# Patient Record
Sex: Female | Born: 1970 | Race: Black or African American | Hispanic: No | Smoking: Never smoker
Health system: Southern US, Community
[De-identification: ages and names within clinical notes are randomized; demographics above are authoritative.]

## PROBLEM LIST (undated history)

## (undated) DIAGNOSIS — D649 Anemia, unspecified: Secondary | ICD-10-CM

## (undated) DIAGNOSIS — M1990 Unspecified osteoarthritis, unspecified site: Secondary | ICD-10-CM

## (undated) DIAGNOSIS — K429 Umbilical hernia without obstruction or gangrene: Secondary | ICD-10-CM

## (undated) DIAGNOSIS — N920 Excessive and frequent menstruation with regular cycle: Secondary | ICD-10-CM

## (undated) DIAGNOSIS — E119 Type 2 diabetes mellitus without complications: Secondary | ICD-10-CM

## (undated) DIAGNOSIS — F419 Anxiety disorder, unspecified: Secondary | ICD-10-CM

## (undated) DIAGNOSIS — F329 Major depressive disorder, single episode, unspecified: Secondary | ICD-10-CM

## (undated) DIAGNOSIS — E785 Hyperlipidemia, unspecified: Secondary | ICD-10-CM

## (undated) DIAGNOSIS — E8881 Metabolic syndrome: Secondary | ICD-10-CM

## (undated) DIAGNOSIS — R5383 Other fatigue: Secondary | ICD-10-CM

## (undated) DIAGNOSIS — E88819 Insulin resistance, unspecified: Secondary | ICD-10-CM

## (undated) DIAGNOSIS — F32A Depression, unspecified: Secondary | ICD-10-CM

## (undated) DIAGNOSIS — M2550 Pain in unspecified joint: Secondary | ICD-10-CM

## (undated) DIAGNOSIS — R7303 Prediabetes: Secondary | ICD-10-CM

## (undated) DIAGNOSIS — E559 Vitamin D deficiency, unspecified: Secondary | ICD-10-CM

## (undated) DIAGNOSIS — I10 Essential (primary) hypertension: Secondary | ICD-10-CM

## (undated) DIAGNOSIS — R600 Localized edema: Secondary | ICD-10-CM

## (undated) DIAGNOSIS — D219 Benign neoplasm of connective and other soft tissue, unspecified: Secondary | ICD-10-CM

## (undated) HISTORY — DX: Major depressive disorder, single episode, unspecified: F32.9

## (undated) HISTORY — DX: Hyperlipidemia, unspecified: E78.5

## (undated) HISTORY — DX: Essential (primary) hypertension: I10

## (undated) HISTORY — DX: Vitamin D deficiency, unspecified: E55.9

## (undated) HISTORY — DX: Other fatigue: R53.83

## (undated) HISTORY — DX: Prediabetes: R73.03

## (undated) HISTORY — PX: REPLACEMENT TOTAL KNEE BILATERAL: SUR1225

## (undated) HISTORY — DX: Type 2 diabetes mellitus without complications: E11.9

## (undated) HISTORY — DX: Anxiety disorder, unspecified: F41.9

## (undated) HISTORY — DX: Depression, unspecified: F32.A

## (undated) HISTORY — DX: Localized edema: R60.0

## (undated) HISTORY — DX: Pain in unspecified joint: M25.50

## (undated) HISTORY — DX: Metabolic syndrome: E88.81

## (undated) HISTORY — DX: Insulin resistance, unspecified: E88.819

---
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
## 1996-01-27 HISTORY — PX: TMJ ARTHROPLASTY: SHX1066

## 1998-06-01 ENCOUNTER — Inpatient Hospital Stay (HOSPITAL_COMMUNITY): Admission: AD | Admit: 1998-06-01 | Discharge: 1998-06-01 | Payer: Self-pay | Admitting: Obstetrics and Gynecology

## 1998-11-22 ENCOUNTER — Encounter (INDEPENDENT_AMBULATORY_CARE_PROVIDER_SITE_OTHER): Payer: Self-pay | Admitting: Specialist

## 1998-11-22 ENCOUNTER — Inpatient Hospital Stay (HOSPITAL_COMMUNITY): Admission: AD | Admit: 1998-11-22 | Discharge: 1998-11-30 | Payer: Self-pay | Admitting: Obstetrics and Gynecology

## 1998-11-22 ENCOUNTER — Encounter: Payer: Self-pay | Admitting: Obstetrics and Gynecology

## 1998-11-25 ENCOUNTER — Encounter: Payer: Self-pay | Admitting: Obstetrics and Gynecology

## 1998-11-26 ENCOUNTER — Encounter: Payer: Self-pay | Admitting: Obstetrics and Gynecology

## 1998-12-01 ENCOUNTER — Encounter: Admission: RE | Admit: 1998-12-01 | Discharge: 1999-03-01 | Payer: Self-pay | Admitting: Obstetrics and Gynecology

## 2001-11-28 ENCOUNTER — Other Ambulatory Visit: Admission: RE | Admit: 2001-11-28 | Discharge: 2001-11-28 | Payer: Self-pay | Admitting: Obstetrics & Gynecology

## 2002-09-11 ENCOUNTER — Other Ambulatory Visit: Admission: RE | Admit: 2002-09-11 | Discharge: 2002-09-11 | Payer: Self-pay | Admitting: *Deleted

## 2003-01-30 ENCOUNTER — Inpatient Hospital Stay (HOSPITAL_COMMUNITY): Admission: AD | Admit: 2003-01-30 | Discharge: 2003-01-30 | Payer: Self-pay | Admitting: Obstetrics and Gynecology

## 2003-01-31 ENCOUNTER — Inpatient Hospital Stay (HOSPITAL_COMMUNITY): Admission: AD | Admit: 2003-01-31 | Discharge: 2003-02-01 | Payer: Self-pay | Admitting: Obstetrics & Gynecology

## 2003-02-06 ENCOUNTER — Inpatient Hospital Stay (HOSPITAL_COMMUNITY): Admission: AD | Admit: 2003-02-06 | Discharge: 2003-02-06 | Payer: Self-pay | Admitting: Obstetrics and Gynecology

## 2003-02-22 ENCOUNTER — Inpatient Hospital Stay (HOSPITAL_COMMUNITY): Admission: AD | Admit: 2003-02-22 | Discharge: 2003-02-27 | Payer: Self-pay | Admitting: Obstetrics and Gynecology

## 2003-02-22 ENCOUNTER — Encounter (INDEPENDENT_AMBULATORY_CARE_PROVIDER_SITE_OTHER): Payer: Self-pay | Admitting: Specialist

## 2003-02-28 ENCOUNTER — Encounter: Admission: RE | Admit: 2003-02-28 | Discharge: 2003-03-30 | Payer: Self-pay | Admitting: *Deleted

## 2003-04-10 ENCOUNTER — Other Ambulatory Visit: Admission: RE | Admit: 2003-04-10 | Discharge: 2003-04-10 | Payer: Self-pay | Admitting: *Deleted

## 2003-04-28 ENCOUNTER — Encounter: Admission: RE | Admit: 2003-04-28 | Discharge: 2003-05-28 | Payer: Self-pay | Admitting: *Deleted

## 2007-09-07 ENCOUNTER — Encounter: Admission: RE | Admit: 2007-09-07 | Discharge: 2007-09-07 | Payer: Self-pay | Admitting: Obstetrics and Gynecology

## 2008-06-20 ENCOUNTER — Encounter (INDEPENDENT_AMBULATORY_CARE_PROVIDER_SITE_OTHER): Payer: Self-pay | Admitting: Obstetrics and Gynecology

## 2008-06-20 ENCOUNTER — Ambulatory Visit (HOSPITAL_COMMUNITY): Admission: RE | Admit: 2008-06-20 | Discharge: 2008-06-20 | Payer: Self-pay | Admitting: Obstetrics and Gynecology

## 2008-06-20 HISTORY — PX: OTHER SURGICAL HISTORY: SHX169

## 2008-06-20 HISTORY — PX: NOVASURE ABLATION: SHX5394

## 2008-06-20 HISTORY — PX: HYSTEROSCOPY WITH D & C: SHX1775

## 2008-06-28 ENCOUNTER — Encounter: Admission: RE | Admit: 2008-06-28 | Discharge: 2008-06-28 | Payer: Self-pay | Admitting: Obstetrics and Gynecology

## 2008-07-13 ENCOUNTER — Encounter: Admission: RE | Admit: 2008-07-13 | Discharge: 2008-07-13 | Payer: Self-pay | Admitting: Obstetrics and Gynecology

## 2008-12-18 ENCOUNTER — Encounter: Admission: RE | Admit: 2008-12-18 | Discharge: 2008-12-18 | Payer: Self-pay | Admitting: Obstetrics and Gynecology

## 2009-07-12 ENCOUNTER — Encounter: Admission: RE | Admit: 2009-07-12 | Discharge: 2009-07-12 | Payer: Self-pay | Admitting: Obstetrics and Gynecology

## 2010-02-12 ENCOUNTER — Encounter
Admission: RE | Admit: 2010-02-12 | Discharge: 2010-02-12 | Payer: Self-pay | Source: Home / Self Care | Attending: Obstetrics and Gynecology | Admitting: Obstetrics and Gynecology

## 2010-02-16 ENCOUNTER — Encounter: Payer: Self-pay | Admitting: Obstetrics and Gynecology

## 2010-05-06 LAB — BASIC METABOLIC PANEL
BUN: 11 mg/dL (ref 6–23)
CO2: 27 mEq/L (ref 19–32)
Calcium: 9.1 mg/dL (ref 8.4–10.5)
Chloride: 102 mEq/L (ref 96–112)
Creatinine, Ser: 0.73 mg/dL (ref 0.4–1.2)
GFR calc Af Amer: >60 mL/min (ref >60)
GFR calc non Af Amer: >60 mL/min (ref >60)
Glucose, Bld: 99 mg/dL (ref 70–99)
Potassium: 3.6 mEq/L (ref 3.5–5.1)
Sodium: 135 mEq/L (ref 135–145)

## 2010-05-06 LAB — CBC
HCT: 27.9 % — ABNORMAL LOW (ref 36.0–46.0)
Hemoglobin: 8.8 g/dL — ABNORMAL LOW (ref 12.0–15.0)
MCHC: 31.5 g/dL (ref 30.0–36.0)
MCV: 71 fL — ABNORMAL LOW (ref 78.0–100.0)
Platelets: 607 10*3/uL — ABNORMAL HIGH (ref 150–400)
RBC: 3.93 MIL/uL (ref 3.87–5.11)
RDW: 23.3 % — ABNORMAL HIGH (ref 11.5–15.5)
WBC: 10.8 10*3/uL — ABNORMAL HIGH (ref 4.0–10.5)

## 2010-05-06 LAB — PREGNANCY, URINE: Preg Test, Ur: NEGATIVE

## 2010-06-10 NOTE — Op Note (Signed)
[name], [name]                  ACCOUNT NO.:  [id number]   MEDICAL RECORD NO.:  [id number]          PATIENT TYPE:  AMB   LOCATION:  SDC                           FACILITY:  [facility]   PHYSICIAN:  Maxie Better, M.D.DATE OF BIRTH:  02/15/1970   DATE OF PROCEDURE:  06/20/2008  DATE OF DISCHARGE:                               OPERATIVE REPORT   PREOPERATIVE DIAGNOSES:  1. Desires sterilization.  2. Menorrhagia.  3. Severe iron deficiency anemia.   PROCEDURE:  Diagnostic hysteroscopy, dilation and curettage, and  NovaSure endometrial ablation, laparoscopic tubal ligation with bipolar  cautery.   POSTOPERATIVE DIAGNOSES:  1. Desires sterilization.  2. Severe iron deficiency anemia.  3. Menorrhagia.  4. Liver mass.   ANESTHESIA:  General.   SURGEON:  Maxie Better, MD   ASSISTANT:  None.   PROCEDURE:  Under adequate general anesthesia, the patient was placed in  dorsal lithotomy position.  She was sterilely prepped and draped in  usual fashion.  The bladder was catheterized for small amount of urine.  Examination under anesthesia revealed a bulky, retroverted uterus.  No  adnexal masses could be appreciated.  A bivalve speculum was placed in  the vagina.  Single-tooth tenaculum was placed on the anterior lip of  the cervix.  Using the NovaSure apparatus for a uterine sound, the  uterine cavity had a depth of 7 cm and the length of the endocervical  canal was about 4.  The diagnostic hysteroscope was introduced into the  uterine cavity.  Both tubal ostia were seen.  Multiple polypoid lesions  were noted throughout.  The diagnostic hysteroscope was removed.  The  cavity was then curetted for moderate amount of tissue at that point.  The NovaSure apparatus hysteroscope was introduced, placed and checked.  Uterine cavity width was 4.5, a power of 154 was generated and the  endometrial ablation was lasted about a minute and 20 seconds.  The  apparatus was then removed.   Uterine cavity was inspected.  The  endometrial ablation occurred at that point.  The acorn cannula was  introduced into the cervical os and attached to the tenaculum for  manipulation of the uterus.  The bivalve speculum was removed and  attention was then turned to the abdomen.  Inspection of the  infraumbilical area, there was a question of a small hernia, therefore  supraumbilically 0.25% Marcaine was injected, incision made, Veress  needle was introduced, tested with good placement.  Veress needle was  then removed.  A 10 mm disposable trocar was introduced to the abdomen  without incident.  A lighted video laparoscope was introduced through  that port.  Panoramic inspection was therefore performed.  At that  point, there was noted to the right of the midline of superiorly on the  anterior abdominal wall was some adhesions.  Looking at the liver,  there was sub-diaphragmatic adhesions noted.  The right lobe of the  liver had a mass probably about 3 cm off at the edge of the right low  inferiorly.  The gallbladder was noted behind that.  This mass was  encased with some adhesions and attached to the adhesions to the  anterior abdominal wall.  Pictures were taken of that finding.  The  appendix was noted to be normal.  The pelvis had no evidence of  endometriosis.  The left tube and ovary was normal.  The right tube was  tortuous and shortened almost like salpingitis nodosum.  The right ovary  was normal.  Uterus was retroverted and bulky.  A second site was placed  suprapubically and 5-mm port was placed under direct visualization.  The  right tube was cauterized in its midportion and care was taken to make  sure that the full-thickness had been done.  The same was performed on  the contralateral side with the procedure being done.  A 0.25% Marcaine  was injected over the sites of cauterization to maximize pain  management.  The 5-mm port was then removed under direct visualization   and so was the infraumbilical port site.  After doing that the rectus  fascia was identified at the supraumbilical site and closed with 0  Vicryl figure-of-eight suture and the skin incisions all were  approximated with Dermabond.  The instruments from the vagina was  removed.  Specimen was the endometrial curetting with polyps, sent to  Pathology.   ESTIMATED BLOOD LOSS:  Minimal.   Fluid deficit from the hysteroscopy was minimal.   COMPLICATION:  None.   The patient tolerated the procedure well and was transferred to recovery  room in stable condition.  Followup on the liver mass will be done as an  outpatient basis.      Maxie Better, M.D.  Electronically Signed     [place]/MEDQ  D:  06/20/2008  T:  06/21/2008  Job:  [id number]

## 2010-06-13 NOTE — Op Note (Signed)
[name], [name]                            ACCOUNT NO.:  [id number]   MEDICAL RECORD NO.:  [id number]                   PATIENT TYPE:  INP   LOCATION:  9108                                 FACILITY:  [facility]   PHYSICIAN:  Dineen Kid. Rana Snare, M.D.                 DATE OF BIRTH:  1970-05-13   DATE OF PROCEDURE:  02/20/2003  DATE OF DISCHARGE:                                 OPERATIVE REPORT   PREOPERATIVE DIAGNOSES:  1. Intrauterine pregnancy at 33-1/2 weeks.  2. Chronic hypertension.  3. Intrauterine growth restriction.  4. Oligohydramnios.  5. Fetal distress.   POSTOPERATIVE DIAGNOSES:  1. Intrauterine pregnancy at 33-1/2 weeks.  2. Chronic hypertension.  3. Intrauterine growth restriction.  4. Oligohydramnios.  5. Fetal distress.   PROCEDURE:  Primary low semi-transverse section.   SURGEON:  Dineen Kid. Rana Snare, M.D.   ANESTHESIA:  Spinal.   ESTIMATED BLOOD LOSS:  800 mL.   INDICATIONS:  [name] is a 40 year old, G2, P1, at 33-5/7, seen in the  office today for continued evaluation of her pregnancy, and she has had  chronic hypertension and IUGR.  Blood pressure today is 170/100.  Ultrasound  shows IUGR with estimated fetal weight less than 3rd percentile.  She also  has severe oligohydramnios, and Doppler flow of the middle cerebral artery  is also abnormal, and she was admitted for labor.  She presented to labor  and delivery, upon placement on the monitor was having severe, deep variable  decelerations with decreased beat-to-beat, consistent with fetal distress.  Proceed with primary low semi-transverse cesarean section for fetal  distress.  Risks and benefits were discussed.  Informed consent was  obtained.   FINDINGS AT THE TIME OF SURGERY:  A viable female infant, Apgars of 8 and 9;  pH arterial 7.32.   DESCRIPTION OF PROCEDURE:  After adequate anesthesia, the patient placed in  the supine position with left lateral tilt.  She is sterilely prepped and  draped.  Foley  catheter was sterilely placed.  Pfannenstiel skin incision  was made 2 fingerbreadths above the pubic symphysis, taken down sharply to  the fascia, incised transversely, extended superiorly and inferiorly out the  bellies of rectus muscles which were separated sharply in the midline.  The  peritoneum is entered sharply.  Bladder flap is created and placed behind  the bladder blade.  A low segment midline incision is made down to the  infant's vertex, extended laterally with the operator's fingertips, and the  infant's vertex was delivered atraumatically, the nares and pharynx  suctioned.  The infant was then delivered, cord clamped, cut, and handed to  the pediatricians for resuscitation.  Good cry was noted.  Cord blood was  then obtained, placenta extracted manually.  Uterus was exteriorized, wiped  clean with a dry lap.  The midline incision was closed in two layers, the  first being a running  locking layer, the second being an imbricated layer of  0 Monocryl suture with good approximation, good hemostasis.  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  The uterus was placed back into the  peritoneal cavity and after a copious amount of irrigation, adequate  hemostasis was assured, peritoneum was closed with 0 Monocryl and the rectus  muscle plicated in the midline.  Irrigation was applied.  After adequate  hemostasis was assured, the fascia was closed with single layer of #0 PDS in  running fashion.  Irrigation once again applied and after adequate  hemostasis, the skin was stapled, Steri-Strips applied.  The patient  tolerated the procedure well and was stable on transfer to recovery room.  Sponge and instrument count was normal x 3.  Estimated blood loss 800 mL.  The patient received 1 g of Rocephin after delivery of placenta.  The  patient was stable on transfer.                                                Dineen Kid Rana Snare, M.D.    DCL/MEDQ  D:  02/22/2003  T:  02/22/2003  Job:  [id number]

## 2010-06-13 NOTE — H&P (Signed)
NAMEHILARY, [name]                            ACCOUNT NO.:  [id number]   MEDICAL RECORD NO.:  [id number]                   PATIENT TYPE:  INP   LOCATION:  9176                                 FACILITY:  [facility]   PHYSICIAN:  Freddy Finner, M.D.                DATE OF BIRTH:  Jun 19, 1970   DATE OF ADMISSION:  01/31/2003  DATE OF DISCHARGE:                                HISTORY & PHYSICAL   ADMITTING DIAGNOSES:  1. Intrauterine pregnancy 30-4/[redacted] weeks gestation.  2. Nonreassuring fetal heart tracing.  3. Chronic hypertension/suspected superimposed pregnancy-induced     hypertension.   BRIEF HISTORY:  Patient is a 40 year old black female gravida 2 para 1 who  had pregnancy-induced hypertension with her first pregnancy in 2000.  She is  known to have chronic hypertension and is currently being treated with  Aldomet 500 mg t.i.d.  She was seen on the day prior to this admission also  at which time a pregnancy-induced hypertension panel was obtained, she did  have a biophysical profile of 8/8 and an AFI of 11, the umbilical artery  flow study was abnormal and the middle cerebral artery flow was normal.  She  was asked to return to the office today and had a fetal heart tracing which  was nonreassuring with late looking type of decels.  She is now admitted for  further management and continuous fetal monitoring.   CURRENT REVIEW OF SYSTEMS:  She denies any cardiopulmonary, GI, or GU  symptoms.   PAST MEDICAL HISTORY:  Recorded in the prenatal summary and will not be  repeated at this time.   PHYSICAL EXAMINATION:  HEENT:  Grossly within normal limits.  Thyroid gland  is not palpably enlarged.  Blood pressure in the office was 154/96.  Deep  tendon reflexes are +3, no clonus.  CHEST:  Clear to auscultation throughout.  HEART:  Normal sinus rhythm without murmurs, rubs, or gallops.  ABDOMEN:  Gravid, fundal height is 29 cm.  CERVIX:  Dilated to 1 at the internal os, is 3 long but  softening, the  vertex is at a -2 station.   STUDIES:  Please note that on pelvic ultrasound in the office on January 30, 2003 her cervical length was measured at 3.6 but there was funneling in the  cervix, estimated fetal weight was 1211 g which is 10th percentile, the  middle cerebral artery was 4.5, the umbilical artery flow was normal.   Her PIH panel showed all parameters within normal limits at this time.   ASSESSMENT:  1. Intrauterine pregnancy 30-4/[redacted] weeks gestation.  2. Chronic hypertension not adequately managed on Aldomet 500 mg t.i.d.  3. Nonreassuring fetal heart tracing.  4. Small for gestational age fetus with abnormal flow studies.   PLAN:  Admission for continuous fetal monitoring, bed rest, fluids, and  repeat ultrasound evaluation.  Freddy Finner, M.D.    WRN/MEDQ  D:  01/31/2003  T:  01/31/2003  Job:  [id number]

## 2010-06-13 NOTE — Discharge Summary (Signed)
NAMESALLYANNE, BIRKHEAD                            ACCOUNT NO.:  [id number]   MEDICAL RECORD NO.:  [id number]                   PATIENT TYPE:  INP   LOCATION:  9108                                 FACILITY:  [facility]   PHYSICIAN:  Tracie Harrier, M.D.              DATE OF BIRTH:  March 27, 1970   DATE OF ADMISSION:  02/22/2003  DATE OF DISCHARGE:  02/27/2003                                 DISCHARGE SUMMARY   ADMITTING DIAGNOSES:  1. Intrauterine pregnancy at 33-and-a-half weeks estimated gestational age.  2. Chronic hypertension.  3. Oligohydramnios.  4. Intrauterine growth retardation.  5. Fetal distress.   DISCHARGE DIAGNOSES:  1. Status post low transverse cesarean section.  2. Viable female infant.   PROCEDURE:  Primary low transverse cesarean section.   REASON FOR ADMISSION:  Please see dictated H&P.   HOSPITAL COURSE:  The patient was a 40 year old gravida 2 para 1 that was  admitted to [facility] at 72 and five-sevenths weeks  estimated gestational age.  The patient had been seen in the office earlier  that day for continued evaluation of her pregnancy.  She had known chronic  hypertension and a history of an IUGR with her previous pregnancy.  She had  been hospitalized during this pregnancy for blood pressure management and  also nonreassuring fetal surveillance.  The patient had done well over the  previous 2 weeks; however, her blood pressure in the office was noted to be  blood pressure of 170/100.  Ultrasound also revealed intrauterine growth  restriction of less than the third percentile.  There had also been noted to  have severe oligohydramnios with abnormal Doppler flow studies in the mean  cerebral artery.  The patient was now admitted for an induction of labor.  She had received betamethasone approximately 2 weeks prior to admission for  enhancement of fetal lung maturity.  Once the patient was admitted to the  hospital and was placed on the monitor it  was noted that the baby was having  severe deep variable decelerations with decreased beat-to-beat variability  consistent with fetal distress.  Decision was made to proceed with a low  transverse cesarean section.  The patient was then transferred to the  operating room where spinal anesthesia was administered without difficulty.  A low transverse incision was made with the delivery of a viable female infant  weighing 2 pounds 12 ounces with Apgars of 8 at one minute and 9 at five  minutes.  Umbilical cord pH was 7.32.  The patient tolerated the procedure  well and was taken to the recovery room in stable condition.  On  postoperative day #1 the patient was without complaint.  She denied headache  or blurred vision or right upper quadrant pain.  Baby was stable in the  NICU.  Vital signs were stable.  Blood pressure 152 to 158 over 92 to 96.  Deep  tendon reflexes 2+ without clonus.  Abdomen was soft with good return  of bowel function.  Abdominal dressing was noted to have a small amount of  old drainage noted on bandage.  Fundus was firm and nontender.  Labs  revealed hemoglobin of 10.5, platelet count of 338, wbc count of 10.7.  On  postoperative day #2 the patient was without complaint.  She denied CNS  symptoms.  Blood pressure 151/91.  She continued to be afebrile.  Abdomen  was soft, fundus was firm.  Abdominal dressing had been removed revealing an  incision that was clean, dry, and intact.  On postoperative day #3 the  patient was without complaint.  Vital signs were stable.  She was ambulating  well and tolerating a regular diet without complaints of nausea and  vomiting.  By postoperative day #4 the patient was without complaint.  Vital  signs were stable, blood pressure 161 to 171 over 71 to 95, deep tendon  reflexes were within normal limits without clonus.  Abdomen was soft.  Fundus was firm and nontender.  The patient was ambulating well.  On  postoperative day #5 the patient  was without complaints.  Vital signs were  stable, blood pressure 153 to 171 over 94 to 110.  Abdomen was soft, fundus  was firm.  Incision was clean, dry, and intact.  Staples were removed and  the patient was discharged home.   CONDITION ON DISCHARGE:  Stable.   DIET:  Regular as tolerated..   ACTIVITY:  No heavy lifting, no driving x2 weeks, no vaginal entry..   FOLLOW-UP:  The patient is to follow up in the office in 1 week for an  incision check.  She is to call for temperature greater than 100 degrees,  persistent nausea and vomiting, heavy vaginal bleeding, and/or redness or  drainage from the incisional site.  The patient was also instructed to call  for headache, blurred vision, or right upper quadrant pain.   DISCHARGE MEDICATIONS:  1. Labetalol 200 mg one three times a day.  2. Aldomet 500 mg three times a day.  3. Percocet 5/325 #30 one p.o. q.4-6h. p.r.n. pain.  4. Motrin 600 mg q.6h. p.r.n.  5. Prenatal vitamins one p.o. daily.     Julio Sicks, N.P.                        Tracie Harrier, M.D.    CC/MEDQ  D:  04/02/2003  T:  04/02/2003  Job:  [id number]

## 2010-06-13 NOTE — H&P (Signed)
[name], [name]                            ACCOUNT NO.:  [id number]   MEDICAL RECORD NO.:  [id number]                   PATIENT TYPE:  INP   LOCATION:  9168                                 [facility]ACILITY[facility]   PHYSICIAN:  Tracie Harrier, M.D.              DATE OF BIRTH:  04-Sep-1970   DATE OF ADMISSION:  02/22/2003  DATE OF DISCHARGE:                                HISTORY & PHYSICAL   HISTORY OF PRESENT ILLNESS:  [name] is a 40 year old female (gravida 2,  para 1) at 33-5/7 weeks.  The patient was seen in the office today for  continued evaluation of her pregnancy.  She has chronic hypertension and a  history of IUGR in a previous pregnancy.  She was recently hospitalized for  blood pressure management and nonreassuring fetal surveillance.  She has  done well for the past two weeks; however, today her blood pressure has  elevated to 170/100.  Ultrasound shows intrauterine growth restriction of  less than third percentile.  There is also severe oligohydramnios.  She also  has abnormal Doppler flow in the middle cerebral artery.  She is admitted  for induction of labor.  She has received a course of steroids some two  weeks ago.   Cervical examination revealed 1-plus thick and vertex.  The patient was  notified of these findings.   OB LABS:  Maternal blood type 0 positive.  Rubella immune.  Group B strep  unknown.  Glucola was normal on February 12, 2003.   MEDICAL HISTORY:  History of chronic hypertension.   SURGICAL HISTORY:  Jaw surgery.   OBSTETRICAL HISTORY:  Normal spontaneous vaginal delivery in 2000; induction  of labor for pregnancy-induced hypertension and chronic hypertension, as  well as IUGR.   CURRENT MEDICATIONS:  1. Prenatal vitamins.  2. Aldomet.  3. Labetolol.   ALLERGIES:  NONE KNOWN.   PHYSICAL EXAMINATION:  VITAL SIGNS:  Vital signs stable.  Blood pressure  170/100, afebrile, fetal heart tones 130's with accelerations noted.  GENERAL:  She is a well  developed, well nourished female in no acute  distress.  HEENT:  Within normal limits.  NECK:  Supple without adenopathy or thyromegaly.  HEART:  Clear.  LUNGS:  Clear.  BREASTS:  Examination deferred.  ABDOMEN:  Gravid, nontender.  EXTREMITIES:  Grossly normal.  Deep tendon reflexes 2+ and no clonus.  Minimal swelling noted in the periphery.  NEUROLOGIC:  Grossly normal.  PELVIC:  Cervical examination 1 cm plus thick, vertex presentation.   ADMISSION DIAGNOSES:  1. Intrauterine pregnancy at 33-5/7 weeks.  2. Intrauterine growth restriction.  3. Oligohydramnios.  4. Abnormal Doppler flows of fetus.  5. Chronic hypertension.   PLAN:  1. Admit.  2. Induction of labor.  3. Plan of action explained to patient.  Tracie Harrier, M.D.    REG/MEDQ  D:  02/22/2003  T:  02/22/2003  Job:  [id number]

## 2010-09-30 ENCOUNTER — Other Ambulatory Visit: Payer: Self-pay | Admitting: Obstetrics and Gynecology

## 2010-09-30 DIAGNOSIS — N6009 Solitary cyst of unspecified breast: Secondary | ICD-10-CM

## 2010-09-30 IMAGING — CT CT ABDOMEN WO/W CM
2 of 8 series · 13 of 46 positions shown, 18 images · IV contrast (READICAT/WATER & [ID] OMNI 300)
Comparison: None

CLINICAL DATA: Right liver mass found during laparoscopic pelvic
surgery last week.  Right abdominal and flank pain for 6-week
months.  No history of cancer or liver disease.

CT ABDOMEN WITHOUT AND WITH CONTRAST
TECHNIQUE: Multidetector CT imaging of the abdomen was performed
following the standard protocol before and during bolus
administration of intravenous contrast.
Contrast: 125 ml Pmnipaque-KII

[Series 4: arterial & venous · axial · arterial · 0.68mm/px · z∈[-266,-26]mm · 11 of 218 slices shown, 15 images]
[im 18/218  soft-tissue]
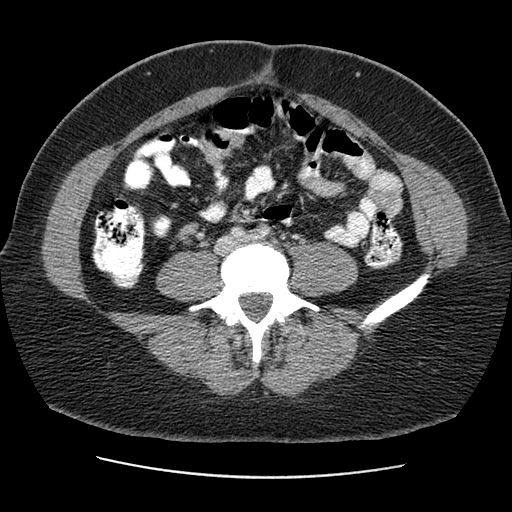
[im 18/218  bone]
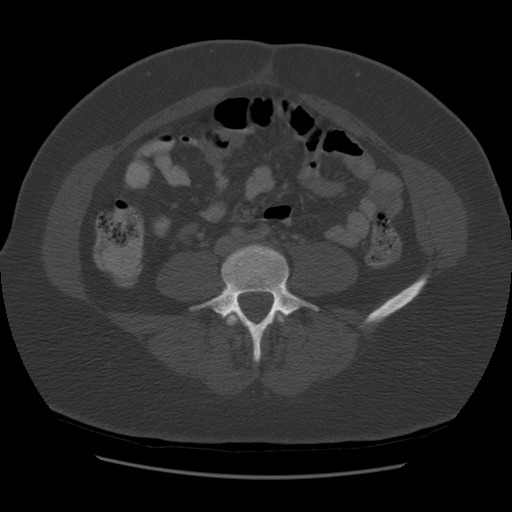
[im 44/218  soft-tissue]
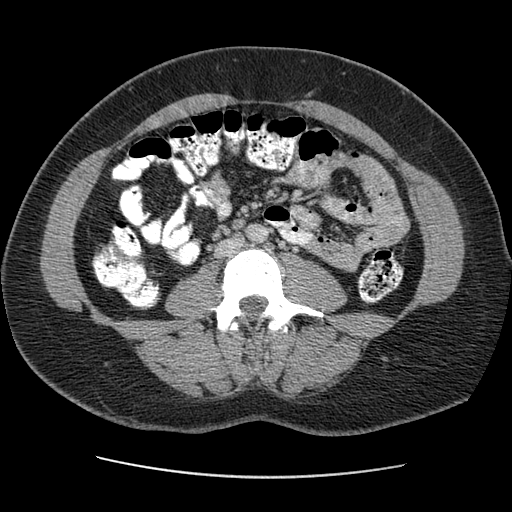
[im 61/218  soft-tissue]
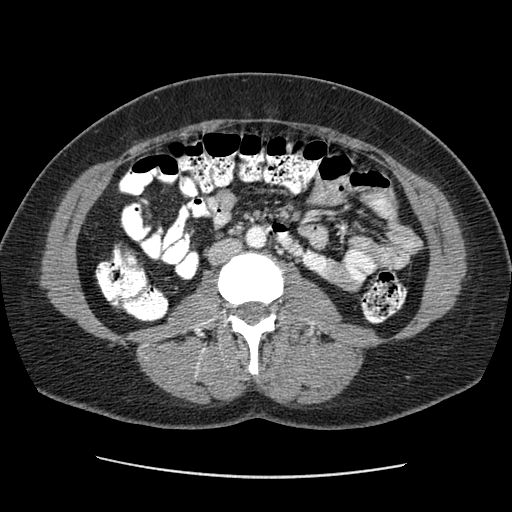
[im 87/218  soft-tissue]
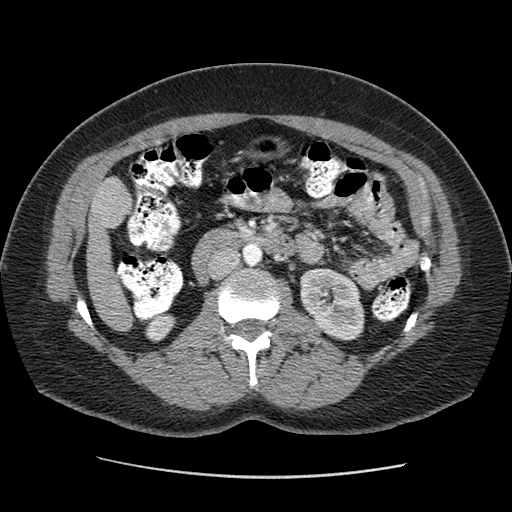
[im 113/218  soft-tissue]
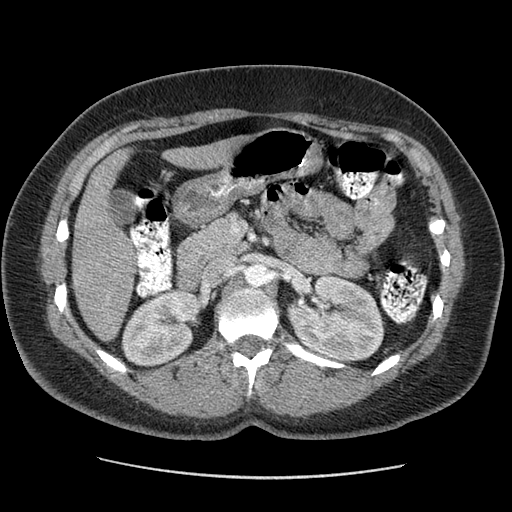
[im 131/218  soft-tissue]
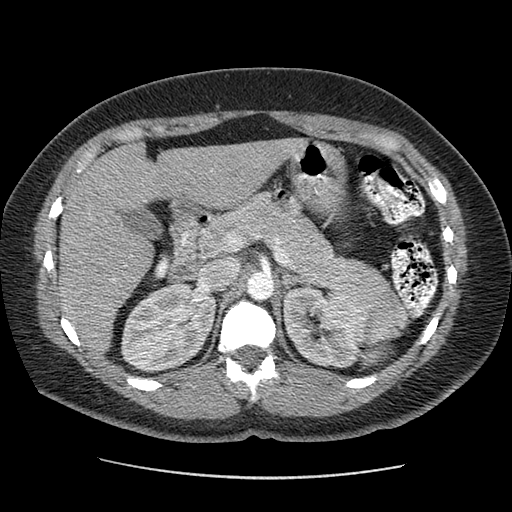
[im 157/218  soft-tissue]
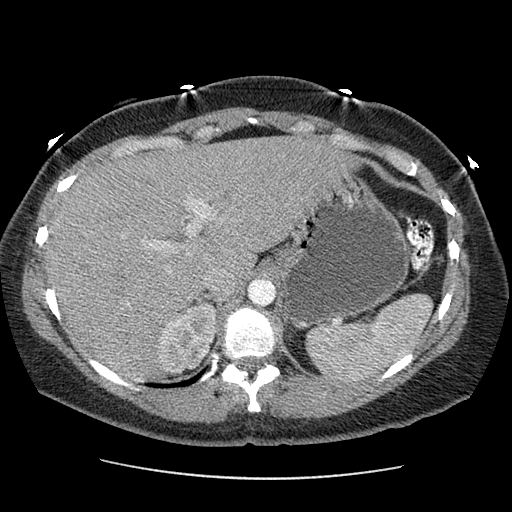
[im 183/218  soft-tissue]
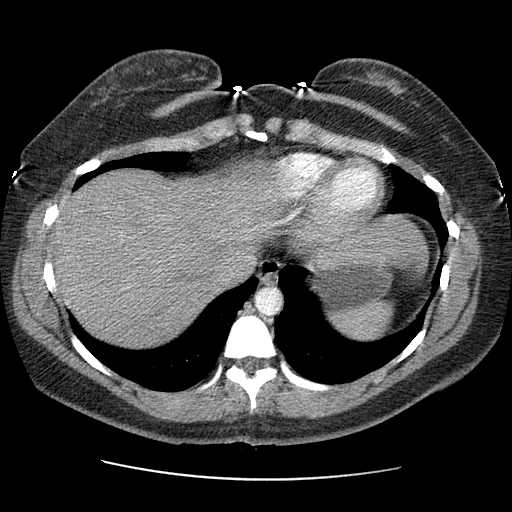
[im 183/218  lung]
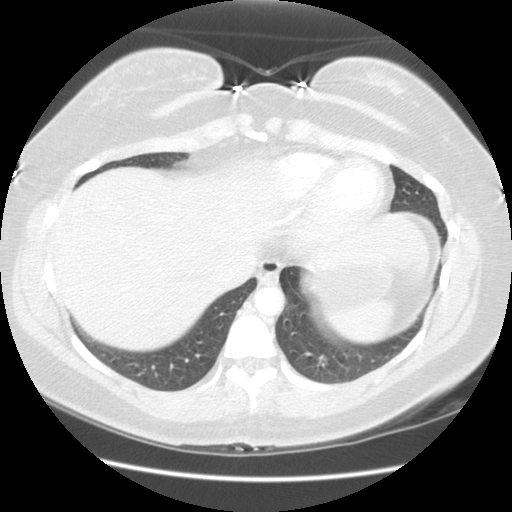
[im 191/218  lung]
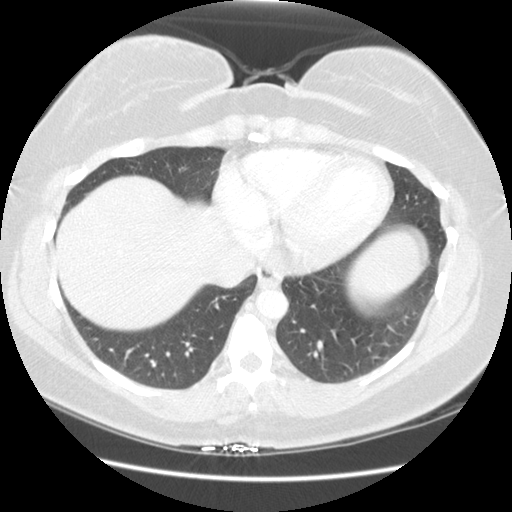
[im 200/218  soft-tissue]
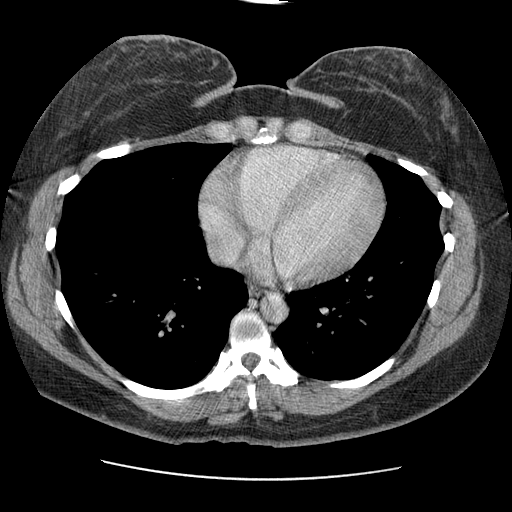
[im 200/218  lung]
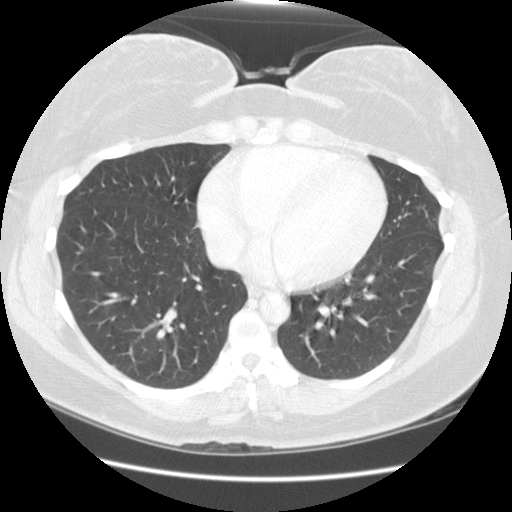
[im 200/218  bone]
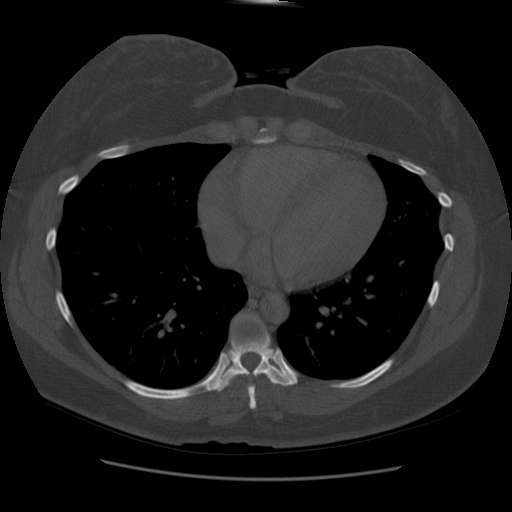
[im 209/218  lung]
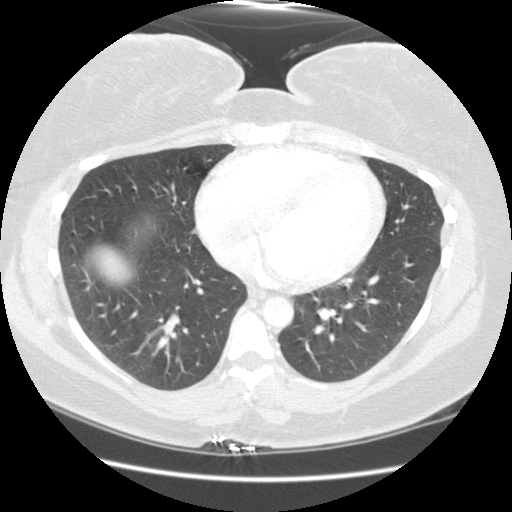

[Series 101: arterial cor · coronal · arterial · 0.68mm/px · 2 of 95 slices shown, 3 images]
[im 32/95  soft-tissue]
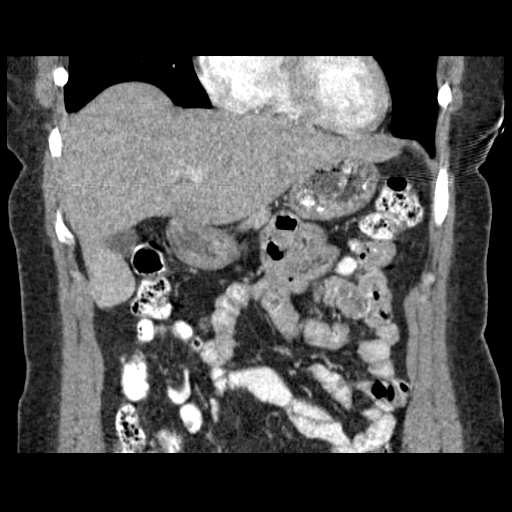
[im 32/95  bone]
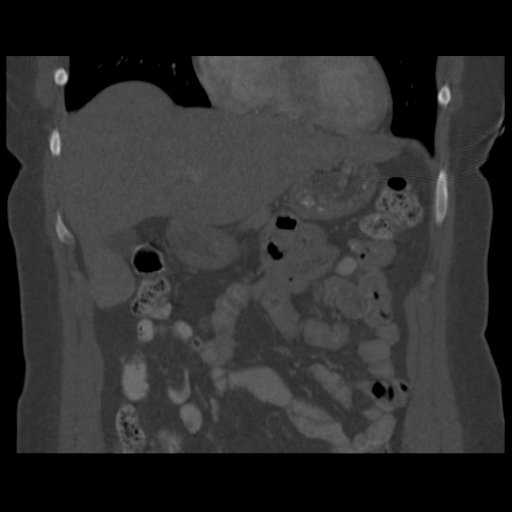
[im 63/95  soft-tissue]
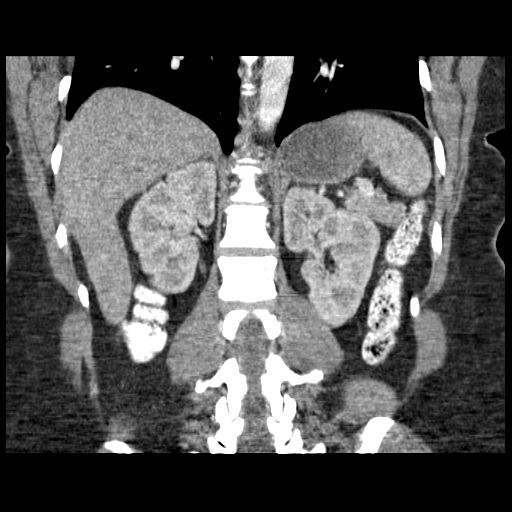

[13 of 46 positions shown; findings below may reference images not displayed]

FINDINGS: Unenhanced images demonstrate no evidence of hepatic
calcifications.  The liver mass is isodense to the remainder of the
liver prior to contrast administration.  No renal calculi.

Clear lung bases. Normal heart size without pericardial or pleural
effusion.  .

Arterial phase images demonstrate no hypervascular lesions within
the pancreas or remainder of the liver.  An exophytic inferior
right hepatic lobe mass demonstrates mild arterial
hyperenhancement.  This measures approximately 4.0 x 3.2 cm
transverse on image 62 of series [DATE] cm cranial caudal on image
34 of series 101.  This area becomes isodense to the remainder the
liver on portal venous phase images.

No evidence of cirrhosis or other liver lesions.  Normal spleen,
stomach, pancreas, gallbladder, biliary tract.  Patent portal vein
and hepatic veins.

Normal adrenal glands and kidneys. No retroperitoneal or
retrocrural adenopathy. Normal abdominal bowel loops without
ascites.  Ventral abdominal wall laxity versus minimal hernia.
Incompletely imaged.  Mildly prominent ileocolic mesenteric lymph
nodes.  The largest node measures 7 mm short axis. No acute osseous
abnormality.
IMPRESSION: 1.  4.0 cm exophytic right liver lobe mass which demonstrates mild
early / arterial hyperenhancement and  relative isodensity on more
delayed postcontrast imaging.  Favored etiology is an area of focal
nodular hyperplasia.  Differential considerations include adenoma.
Correlate with oral contraceptive use, which increases the risk of
adenoma.  This would likely be amendable to percutaneous biopsy.
Alternatively, MRI using hepatobiliary specific Eovist contrast
would likely be diagnostic.
2.  Mildly prominent ileocolic mesenteric lymph nodes which are
likely reactive. Recommend attention on follow-up.

## 2010-10-10 ENCOUNTER — Other Ambulatory Visit: Payer: Self-pay

## 2010-11-14 ENCOUNTER — Ambulatory Visit
Admission: RE | Admit: 2010-11-14 | Discharge: 2010-11-14 | Disposition: A | Discharge status: Home / Self Care | Payer: BC Managed Care – PPO | Source: Ambulatory Visit | Attending: Obstetrics and Gynecology | Admitting: Obstetrics and Gynecology

## 2010-11-14 DIAGNOSIS — N6009 Solitary cyst of unspecified breast: Secondary | ICD-10-CM

## 2010-11-27 ENCOUNTER — Encounter (INDEPENDENT_AMBULATORY_CARE_PROVIDER_SITE_OTHER): Payer: Self-pay

## 2010-12-02 ENCOUNTER — Ambulatory Visit (INDEPENDENT_AMBULATORY_CARE_PROVIDER_SITE_OTHER): Payer: BC Managed Care – PPO | Admitting: General Surgery

## 2011-04-06 LAB — BASIC METABOLIC PANEL
BUN: 8 mg/dL (ref 4–21)
Creatinine: 0.7 mg/dL (ref <=1.1)
Glucose: 74 mg/dL
Potassium: 4 mmol/L (ref 3.4–5.3)
Sodium: 141 mmol/L (ref 137–147)

## 2011-04-29 ENCOUNTER — Other Ambulatory Visit: Payer: Self-pay | Admitting: Gastroenterology

## 2011-04-29 DIAGNOSIS — K769 Liver disease, unspecified: Secondary | ICD-10-CM

## 2011-05-01 ENCOUNTER — Ambulatory Visit
Admission: RE | Admit: 2011-05-01 | Discharge: 2011-05-01 | Disposition: A | Discharge status: Home / Self Care | Payer: BC Managed Care – PPO | Source: Ambulatory Visit | Attending: Gastroenterology | Admitting: Gastroenterology

## 2011-05-01 DIAGNOSIS — K769 Liver disease, unspecified: Secondary | ICD-10-CM

## 2011-05-01 MED ORDER — GADOBENATE DIMEGLUMINE 529 MG/ML IV SOLN: 20.0000 mL | Freq: Once | INTRAVENOUS | Status: AC | PRN

## 2011-09-08 ENCOUNTER — Other Ambulatory Visit: Payer: Self-pay | Admitting: Obstetrics and Gynecology

## 2011-09-08 DIAGNOSIS — N6009 Solitary cyst of unspecified breast: Secondary | ICD-10-CM

## 2011-09-17 ENCOUNTER — Other Ambulatory Visit: Payer: BC Managed Care – PPO

## 2012-02-12 ENCOUNTER — Encounter (HOSPITAL_COMMUNITY): Payer: Self-pay

## 2012-02-18 ENCOUNTER — Other Ambulatory Visit: Payer: BC Managed Care – PPO

## 2012-02-18 ENCOUNTER — Other Ambulatory Visit: Payer: Self-pay | Admitting: Obstetrics and Gynecology

## 2012-02-18 ENCOUNTER — Inpatient Hospital Stay: Admission: RE | Admit: 2012-02-18 | Payer: BC Managed Care – PPO | Source: Ambulatory Visit

## 2012-02-18 DIAGNOSIS — N6009 Solitary cyst of unspecified breast: Secondary | ICD-10-CM

## 2012-07-13 ENCOUNTER — Ambulatory Visit: Payer: BC Managed Care – PPO | Admitting: Internal Medicine

## 2012-07-14 ENCOUNTER — Other Ambulatory Visit: Payer: BC Managed Care – PPO

## 2012-07-27 ENCOUNTER — Ambulatory Visit
Admission: RE | Admit: 2012-07-27 | Discharge: 2012-07-27 | Disposition: A | Discharge status: Home / Self Care | Payer: BC Managed Care – PPO | Source: Ambulatory Visit | Attending: Obstetrics and Gynecology | Admitting: Obstetrics and Gynecology

## 2012-07-27 DIAGNOSIS — N6009 Solitary cyst of unspecified breast: Secondary | ICD-10-CM

## 2012-07-28 ENCOUNTER — Ambulatory Visit (INDEPENDENT_AMBULATORY_CARE_PROVIDER_SITE_OTHER): Payer: BC Managed Care – PPO | Admitting: Internal Medicine

## 2012-07-28 ENCOUNTER — Encounter: Payer: Self-pay | Admitting: Internal Medicine

## 2012-07-28 VITALS — BP 138/84 | HR 82 | Temp 98.3°F | Resp 18 | Ht 66.0 in | Wt 228.0 lb

## 2012-07-28 DIAGNOSIS — Z9889 Other specified postprocedural states: Secondary | ICD-10-CM

## 2012-07-28 DIAGNOSIS — D649 Anemia, unspecified: Secondary | ICD-10-CM

## 2012-07-28 DIAGNOSIS — F411 Generalized anxiety disorder: Secondary | ICD-10-CM

## 2012-07-28 DIAGNOSIS — N920 Excessive and frequent menstruation with regular cycle: Secondary | ICD-10-CM

## 2012-07-28 DIAGNOSIS — Z803 Family history of malignant neoplasm of breast: Secondary | ICD-10-CM

## 2012-07-28 DIAGNOSIS — F419 Anxiety disorder, unspecified: Secondary | ICD-10-CM

## 2012-07-28 DIAGNOSIS — I10 Essential (primary) hypertension: Secondary | ICD-10-CM

## 2012-07-28 NOTE — Patient Instructions (Addendum)
See me as needed 

## 2012-07-28 NOTE — Progress Notes (Signed)
Subjective:    Patient ID: [name], female    DOB: September 09, 1970, 42 y.o.   MRN: [id number]  HPI [name]  is a new pt. Here for first visit (daughter of Sandi Raveling my pt).  PMH of long standing HTN, anemia secondary to menorrhagia (s/P endometrial ablation - Dr. Cherly Hensen),  DJD of knees,  Anxiety (marital stress undergoing divorce now),. FH of breast cancer in mother and maternal aunt.    [name] also report she has a ventral hernia that developed after her ablation.  She tells me she has started to have heavy menses again.  She has appt with Dr. Cherly Hensen next week  Overall doing well has FH of diabetes in mother and father and she has been craving sweets.  No polyuria, or polydipsia.    No Known Allergies Past Medical History  Diagnosis Date  . Hypertension   . Insulin resistance    Past Surgical History  Procedure Laterality Date  . Novasure ablation  06-20-2008  . Hysteroscopy w/d&c  06-20-2008  . Tubal ligation  06-20-2008  . Cryotherapy  1989  . Cesarean section    . Tmj arthroplasty  1998   History   Social History  . Marital Status: Married    Spouse Name: N/A    Number of Children: N/A  . Years of Education: N/A   Occupational History  . Not on file.   Social History Main Topics  . Smoking status: Never Smoker   . Smokeless tobacco: Never Used  . Alcohol Use: No  . Drug Use: No  . Sexually Active: Yes    Birth Control/ Protection: Surgical   Other Topics Concern  . Not on file   Social History Narrative  . No narrative on file   Family History  Problem Relation Age of Onset  . Breast cancer Mother   . Breast cancer Maternal Aunt   . Hypertension Mother   . Hypertension Father   . Diabetes Mother    Patient Active Problem List   Diagnosis Date Noted  . Anemia 07/28/2012  . Anxiety 07/28/2012  . HTN (hypertension) 07/28/2012  . Menorrhagia 07/28/2012   Current Outpatient Prescriptions on File Prior to Visit  Medication Sig Dispense Refill   . Nebivolol HCl (BYSTOLIC PO) Take 10 mg by mouth.       Marland Kitchen HYDROCODONE-IBUPROFEN PO Take by mouth.         No current facility-administered medications on file prior to visit.       Review of Systems See HPI    Objective:   Physical Exam Physical Exam  Nursing note and vitals reviewed.  Constitutional: She is oriented to person, place, and time. She appears well-developed and well-nourished.  HENT:  Head: Normocephalic and atraumatic.  Cardiovascular: Normal rate and regular rhythm. Exam reveals no gallop and no friction rub.  No murmur heard.  Pulmonary/Chest: Breath sounds normal. She has no wheezes. She has no rales.  Neurological: She is alert and oriented to person, place, and time.  Skin: Skin is warm and dry.  Psychiatric: She has a normal mood and affect. Her behavior is normal.        Assessment & Plan:  HTN:  Continue current meds.  Will check lipids, chemistries,  TSH when pt is fasting  Accucheck in office today is 105  History of anemia S/P ablation she has appt. with her GYN next week.  Anxiety  She is nearing end of divorce process and not needing meds now  DJD  Involving knees  NSAID of choice OTC for now

## 2012-08-12 ENCOUNTER — Encounter: Payer: Self-pay | Admitting: Internal Medicine

## 2012-08-12 DIAGNOSIS — Z872 Personal history of diseases of the skin and subcutaneous tissue: Secondary | ICD-10-CM | POA: Insufficient documentation

## 2012-08-12 DIAGNOSIS — E559 Vitamin D deficiency, unspecified: Secondary | ICD-10-CM | POA: Insufficient documentation

## 2012-08-12 LAB — COMPREHENSIVE METABOLIC PANEL
ALT: 11 U/L (ref 0–35)
AST: 12 U/L (ref 0–37)
Albumin: 4.4 g/dL (ref 3.5–5.2)
Alkaline Phosphatase: 54 U/L (ref 39–117)
BUN: 8 mg/dL (ref 6–23)
CO2: 28 mEq/L (ref 19–32)
Calcium: 9.5 mg/dL (ref 8.4–10.5)
Chloride: 104 mEq/L (ref 96–112)
Creat: 0.75 mg/dL (ref 0.50–1.10)
Glucose, Bld: 101 mg/dL — ABNORMAL HIGH (ref 70–99)
Potassium: 4.3 mEq/L (ref 3.5–5.3)
Sodium: 139 mEq/L (ref 135–145)
Total Bilirubin: 0.5 mg/dL (ref 0.3–1.2)
Total Protein: 7.3 g/dL (ref 6.0–8.3)

## 2012-08-12 LAB — CBC WITH DIFFERENTIAL/PLATELET
Basophils Absolute: 0.1 10*3/uL (ref 0.0–0.1)
Basophils Relative: 1 % (ref 0–1)
Eosinophils Absolute: 0.1 10*3/uL (ref 0.0–0.7)
Eosinophils Relative: 2 % (ref 0–5)
HCT: 37.1 % (ref 36.0–46.0)
Hemoglobin: 12.1 g/dL (ref 12.0–15.0)
Lymphocytes Relative: 48 % — ABNORMAL HIGH (ref 12–46)
Lymphs Abs: 3.7 10*3/uL (ref 0.7–4.0)
MCH: 27.3 pg (ref 26.0–34.0)
MCHC: 32.6 g/dL (ref 30.0–36.0)
MCV: 83.6 fL (ref 78.0–100.0)
Monocytes Absolute: 0.5 10*3/uL (ref 0.1–1.0)
Monocytes Relative: 7 % (ref 3–12)
Neutro Abs: 3.3 10*3/uL (ref 1.7–7.7)
Neutrophils Relative %: 42 % — ABNORMAL LOW (ref 43–77)
Platelets: 523 10*3/uL — ABNORMAL HIGH (ref 150–400)
RBC: 4.44 MIL/uL (ref 3.87–5.11)
RDW: 19.6 % — ABNORMAL HIGH (ref 11.5–15.5)
WBC: 7.7 10*3/uL (ref 4.0–10.5)

## 2012-08-12 LAB — LIPID PANEL
Cholesterol: 169 mg/dL (ref 0–200)
HDL: 40 mg/dL (ref >39)
LDL Cholesterol: 99 mg/dL (ref 0–99)
Total CHOL/HDL Ratio: 4.2 Ratio
Triglycerides: 152 mg/dL — ABNORMAL HIGH (ref <150)
VLDL: 30 mg/dL (ref 0–40)

## 2012-08-13 LAB — TSH: TSH: 2.047 u[IU]/mL (ref 0.350–4.500)

## 2012-08-13 LAB — VITAMIN D 25 HYDROXY (VIT D DEFICIENCY, FRACTURES): Vit D, 25-Hydroxy: 34 ng/mL (ref 30–89)

## 2012-08-16 ENCOUNTER — Encounter: Payer: Self-pay | Admitting: *Deleted

## 2012-08-23 ENCOUNTER — Ambulatory Visit (INDEPENDENT_AMBULATORY_CARE_PROVIDER_SITE_OTHER): Payer: BC Managed Care – PPO | Admitting: General Surgery

## 2012-08-23 ENCOUNTER — Encounter (INDEPENDENT_AMBULATORY_CARE_PROVIDER_SITE_OTHER): Payer: Self-pay | Admitting: General Surgery

## 2012-08-23 VITALS — BP 148/100 | HR 94 | Resp 16 | Ht 66.0 in | Wt 228.6 lb

## 2012-08-23 DIAGNOSIS — K429 Umbilical hernia without obstruction or gangrene: Secondary | ICD-10-CM

## 2012-08-23 NOTE — Patient Instructions (Signed)
Please call once you have decided whether to fix umbilical hernia separately or as part of laparoscopic gyn procedure

## 2012-09-06 ENCOUNTER — Encounter (INDEPENDENT_AMBULATORY_CARE_PROVIDER_SITE_OTHER): Payer: Self-pay | Admitting: General Surgery

## 2012-09-06 NOTE — Progress Notes (Signed)
Patient ID: [name], female   DOB: 1970/05/03, 42 y.o.   MRN: [id number]  Chief Complaint  Patient presents with  . New Evaluation    eval umb hernia    HPI [name] is a 42 y.o. female.  We are asked to see the patient in consultation by Dr. Constance Goltz to evaluate her for an umbilical hernia. The patient is a 42 year old female who has a history of C-section. She has also had her tubes tied in 2010. She has had some occasional pain associated with her bellybutton. She denies any nausea or vomiting. She has noticed some bulging at her bellybutton. Her appetite is good and her bowels are working normally.  HPI  Past Medical History  Diagnosis Date  . Hypertension   . Insulin resistance     Past Surgical History  Procedure Laterality Date  . Novasure ablation  06-20-2008  . Hysteroscopy w/d&c  06-20-2008  . Tubal ligation  06-20-2008  . Cryotherapy  1989  . Cesarean section    . Tmj arthroplasty  1998    Family History  Problem Relation Age of Onset  . Breast cancer Mother   . Hypertension Mother   . Diabetes Mother   . Cancer Mother     breast  . Breast cancer Maternal Aunt   . Cancer Maternal Aunt     beast  . Hypertension Father     Social History History  Substance Use Topics  . Smoking status: Never Smoker   . Smokeless tobacco: Never Used  . Alcohol Use: No    No Known Allergies  Current Outpatient Prescriptions  Medication Sig Dispense Refill  . cholecalciferol (VITAMIN D) 1000 UNITS tablet Take 1,000 Units by mouth daily.      Marland Kitchen HYDROCODONE-IBUPROFEN PO Take by mouth.        Marland Kitchen lisinopril-hydrochlorothiazide (PRINZIDE,ZESTORETIC) 20-12.5 MG per tablet Take 1 tablet by mouth daily.      . naproxen (NAPROSYN) 500 MG tablet Take 500 mg by mouth 2 (two) times daily with a meal.      . Nebivolol HCl (BYSTOLIC PO) Take 10 mg by mouth.       . traMADol (ULTRAM) 50 MG tablet       . tranexamic acid (LYSTEDA) 650 MG TABS        No current  facility-administered medications for this visit.    Review of Systems Review of Systems  Constitutional: Negative.   HENT: Negative.   Eyes: Negative.   Respiratory: Negative.   Cardiovascular: Negative.   Gastrointestinal: Positive for abdominal pain. Negative for nausea and vomiting.  Endocrine: Negative.   Genitourinary: Negative.   Musculoskeletal: Negative.   Skin: Negative.   Allergic/Immunologic: Negative.   Neurological: Negative.   Hematological: Negative.   Psychiatric/Behavioral: Negative.     Blood pressure 148/100, pulse 94, resp. rate 16, height 5\' 6"  (1.676 m), weight 228 lb 9.6 oz (103.692 kg), last menstrual period 07/12/2012.  Physical Exam Physical Exam  Constitutional: She is oriented to person, place, and time. She appears well-developed and well-nourished.  HENT:  Head: Normocephalic and atraumatic.  Eyes: Conjunctivae and EOM are normal. Pupils are equal, round, and reactive to light.  Neck: Normal range of motion. Neck supple.  Cardiovascular: Normal rate, regular rhythm and normal heart sounds.   Pulmonary/Chest: Effort normal and breath sounds normal.  Abdominal: Soft. Bowel sounds are normal.  There is a small moderate sized umbilical hernia that is easily reducible.  Musculoskeletal: Normal range of  motion.  Neurological: She is alert and oriented to person, place, and time.  Skin: Skin is warm and dry.  Psychiatric: She has a normal mood and affect. Her behavior is normal.    Data Reviewed As above  Assessment    The patient appears to have a small but symptomatic umbilical hernia. Because of the risk of incarceration and strangulation I think she would benefit from having this fixed. I've discussed with her in detail the risks and benefits of the operation to fix the hernia as well as some of the technical aspects including the possibility of using mesh and she understands. She is considering having another her GYN type of operation in the  near future. If she would like this fixed at the time of that operation I think that would be a reasonable plan. If we did this we would probably close the hernia primarily and not use mesh because of the risk of mesh infection. She is going to consider this and then let us know.     Plan    Plan for umbilical hernia repair possibly with mesh either as a separate procedure or as a combined procedure during another GYN operation.        TOTH III,Sephira Zellman S 09/06/2012, 8:30 AM

## 2012-10-10 ENCOUNTER — Other Ambulatory Visit: Payer: Self-pay | Admitting: *Deleted

## 2012-10-10 MED ORDER — LISINOPRIL-HYDROCHLOROTHIAZIDE 20-12.5 MG PO TABS: 1.0000 | ORAL_TABLET | Freq: Every day | ORAL | Status: DC

## 2012-10-10 MED ORDER — NAPROXEN 500 MG PO TABS: 500.0000 mg | ORAL_TABLET | Freq: Two times a day (BID) | ORAL | Status: DC

## 2012-10-10 NOTE — Telephone Encounter (Signed)
[name] needs some refills.  She especially needs the medicine for sleep asap. She states she has been out and has not slept much in 3 days.  Nebivolol HCl (BYSTOLIC PO) 10 mg   lisinopril-hydrochlorothiazide (PRINZIDE,ZESTORETIC) 20-12.5 MG per tablet  naproxen (NAPROSYN) 500 MG tablet  Quetiapine 25 mg as needed for sleep.  [facility] on [address]

## 2012-10-10 NOTE — Telephone Encounter (Signed)
[name]  Give this pt a 30 min appt to see me this week    I do not give antipsychotics (Seroquel) for sleep  Inquire if she is taking Bystolic and have pharmacy send [name] a fax for this  I sent in other refills

## 2012-10-10 NOTE — Telephone Encounter (Signed)
Pt will come in on 9/17

## 2012-10-10 NOTE — Telephone Encounter (Signed)
Refill request see Heather's note I see where the Seroquel was discontinued last visit and the bystolic has not been filled since 2012 per our records.

## 2012-10-12 ENCOUNTER — Ambulatory Visit: Payer: BC Managed Care – PPO | Admitting: Internal Medicine

## 2012-10-17 ENCOUNTER — Encounter: Payer: Self-pay | Admitting: Internal Medicine

## 2012-10-17 ENCOUNTER — Ambulatory Visit (INDEPENDENT_AMBULATORY_CARE_PROVIDER_SITE_OTHER): Payer: BC Managed Care – PPO | Admitting: Internal Medicine

## 2012-10-17 ENCOUNTER — Encounter: Payer: Self-pay | Admitting: *Deleted

## 2012-10-17 VITALS — BP 166/98 | HR 94 | Temp 99.3°F | Resp 18 | Wt 234.0 lb

## 2012-10-17 DIAGNOSIS — F411 Generalized anxiety disorder: Secondary | ICD-10-CM

## 2012-10-17 DIAGNOSIS — F418 Other specified anxiety disorders: Secondary | ICD-10-CM

## 2012-10-17 DIAGNOSIS — D649 Anemia, unspecified: Secondary | ICD-10-CM

## 2012-10-17 DIAGNOSIS — G4700 Insomnia, unspecified: Secondary | ICD-10-CM

## 2012-10-17 DIAGNOSIS — I10 Essential (primary) hypertension: Secondary | ICD-10-CM

## 2012-10-17 MED ORDER — LORAZEPAM 1 MG PO TABS: ORAL_TABLET | ORAL | Status: DC

## 2012-10-17 MED ORDER — LISINOPRIL-HYDROCHLOROTHIAZIDE 20-25 MG PO TABS: 1.0000 | ORAL_TABLET | Freq: Every day | ORAL | Status: DC

## 2012-10-17 NOTE — Patient Instructions (Addendum)
See me in 3 weeks

## 2012-10-17 NOTE — Progress Notes (Signed)
Subjective:    Patient ID: [name], female    DOB: [birth year], 42 y.o.   MRN: [id number]  HPI  [name] is here for follow up and concern over insomnia  Significant situational stresses with divorce and her 42 yo has been acting out in angry episodes.  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  NO report of snoring when she was with her spouse.    See BP . She has not had her BP meds in the last few days.  She is asymptomatic  No Known Allergies Past Medical History  Diagnosis Date  . Hypertension   . Insulin resistance    Past Surgical History  Procedure Laterality Date  . Novasure ablation  06-20-2008  . Hysteroscopy w/d&c  06-20-2008  . Tubal ligation  06-20-2008  . Cryotherapy  1989  . Cesarean section    . Tmj arthroplasty  1998   History   Social History  . Marital Status: Divorced    Spouse Name: N/A    Number of Children: N/A  . Years of Education: N/A   Occupational History  . Not on file.   Social History Main Topics  . Smoking status: Never Smoker   . Smokeless tobacco: Never Used  . Alcohol Use: No  . Drug Use: No  . Sexual Activity: Yes    Birth Control/ Protection: Surgical   Other Topics Concern  . Not on file   Social History Narrative  . No narrative on file   Family History  Problem Relation Age of Onset  . Breast cancer Mother   . Hypertension Mother   . Diabetes Mother   . Cancer Mother     breast  . Breast cancer Maternal Aunt   . Cancer Maternal Aunt     beast  . Hypertension Father    Patient Active Problem List   Diagnosis Date Noted  . Umbilical hernia 08/23/2012  . Vitamin D deficiency 08/12/2012  . History of cyst of breast 08/12/2012  . Anemia 07/28/2012  . Anxiety 07/28/2012  . HTN (hypertension) 07/28/2012  . Menorrhagia 07/28/2012  . S/P endometrial ablation 07/28/2012  . Family history of breast cancer in  first degree relative 07/28/2012   Current Outpatient Prescriptions on File Prior to Visit  Medication Sig Dispense Refill  . cholecalciferol (VITAMIN D) 1000 UNITS tablet Take 1,000 Units by mouth daily.      Marland Kitchen HYDROCODONE-IBUPROFEN PO Take by mouth.        Marland Kitchen lisinopril-hydrochlorothiazide (PRINZIDE,ZESTORETIC) 20-12.5 MG per tablet Take 1 tablet by mouth daily.  90 tablet  0  . naproxen (NAPROSYN) 500 MG tablet Take 1 tablet (500 mg total) by mouth 2 (two) times daily with a meal.  30 tablet  1  . Nebivolol HCl (BYSTOLIC PO) Take 10 mg by mouth.       . tranexamic acid (LYSTEDA) 650 MG TABS       . traMADol (ULTRAM) 50 MG tablet        No current facility-administered medications on file prior to visit.     Review of Systems See HPI     Objective:   Physical Exam Physical Exam  Nursing note and vitals reviewed.  Constitutional: She is oriented to person, place, and time. She appears well-developed and well-nourished.  HENT:  Head: Normocephalic and atraumatic.  Cardiovascular:  Normal rate and regular rhythm. Exam reveals no gallop and no friction rub.  No murmur heard.  Pulmonary/Chest: Breath sounds normal. She has no wheezes. She has no rales.  Neurological: She is alert and oriented to person, place, and time.  Skin: Skin is warm and dry.  Psychiatric: She has a normal mood and affect. Her behavior is normal.             Assessment & Plan:  HTN  Will increase lisinopril/hctz to 20/25 along with bystolic  and advised she must take her med daily.  She is to see me in 3 weeks aftert taking her BP meds every day  Insomnia  Due to stress/anxiety  Will give ativan 1 mg 1/2 to one tablet  2-3 times a week.  Advised not to take daily  Anxiety  See above  Anemia  Resolved  S/P  Endometrial ablation  See mm .  She has cysts with mildy dilated milk duct.   This if followed by Dr. Cherly Hensen. Advised pt she will need repeat in 07/2013 and to keep contact with Dr. Cherly Hensen for  this

## 2012-11-15 ENCOUNTER — Ambulatory Visit: Payer: BC Managed Care – PPO | Admitting: Internal Medicine

## 2012-11-28 ENCOUNTER — Other Ambulatory Visit: Payer: Self-pay | Admitting: *Deleted

## 2012-11-28 ENCOUNTER — Telehealth: Payer: Self-pay | Admitting: *Deleted

## 2012-11-28 MED ORDER — LORAZEPAM 1 MG PO TABS: ORAL_TABLET | ORAL | Status: DC

## 2012-11-28 NOTE — Telephone Encounter (Signed)
Bobbie  OK to call in #12 with one refill

## 2012-11-28 NOTE — Telephone Encounter (Signed)
Needs refill on LORazepam (ATIVAN) 1 MG tablet  For insomnia.  She is out of this medication.

## 2012-11-28 NOTE — Telephone Encounter (Signed)
Called in Aivan to [employer] [facility]

## 2012-11-28 NOTE — Telephone Encounter (Signed)
Refill request. Will call in pending approval 

## 2012-12-07 ENCOUNTER — Ambulatory Visit (INDEPENDENT_AMBULATORY_CARE_PROVIDER_SITE_OTHER): Payer: BC Managed Care – PPO | Admitting: Internal Medicine

## 2012-12-07 ENCOUNTER — Encounter: Payer: Self-pay | Admitting: *Deleted

## 2012-12-07 ENCOUNTER — Encounter: Payer: Self-pay | Admitting: Internal Medicine

## 2012-12-07 VITALS — BP 149/96 | HR 81 | Temp 99.0°F | Resp 16 | Wt 230.0 lb

## 2012-12-07 DIAGNOSIS — I10 Essential (primary) hypertension: Secondary | ICD-10-CM

## 2012-12-07 DIAGNOSIS — G4700 Insomnia, unspecified: Secondary | ICD-10-CM | POA: Insufficient documentation

## 2012-12-07 DIAGNOSIS — F419 Anxiety disorder, unspecified: Secondary | ICD-10-CM

## 2012-12-07 DIAGNOSIS — F411 Generalized anxiety disorder: Secondary | ICD-10-CM

## 2012-12-07 MED ORDER — ATENOLOL 25 MG PO TABS: 25.0000 mg | ORAL_TABLET | Freq: Two times a day (BID) | ORAL | Status: DC

## 2012-12-07 NOTE — Progress Notes (Signed)
Subjective:    Patient ID: [name], female    DOB: December 08, 1970, 41 y.o.   MRN: [id number]  HPI  [name] is here for follow up  She states BP at home running  140's  Systolic  She is asymptomatic .  Bystolic very expensive for her  Using Ativan at night for anxiety related insomnia.  Using only 2-3 times per week  No Known Allergies Past Medical History  Diagnosis Date  . Hypertension   . Insulin resistance    Past Surgical History  Procedure Laterality Date  . Novasure ablation  06-20-2008  . Hysteroscopy w/d&c  06-20-2008  . Tubal ligation  06-20-2008  . Cryotherapy  1989  . Cesarean section    . Tmj arthroplasty  1998   History   Social History  . Marital Status: Divorced    Spouse Name: N/A    Number of Children: N/A  . Years of Education: N/A   Occupational History  . Not on file.   Social History Main Topics  . Smoking status: Never Smoker   . Smokeless tobacco: Never Used  . Alcohol Use: No  . Drug Use: No  . Sexual Activity: Yes    Birth Control/ Protection: Surgical   Other Topics Concern  . Not on file   Social History Narrative  . No narrative on file   Family History  Problem Relation Age of Onset  . Breast cancer Mother   . Hypertension Mother   . Diabetes Mother   . Cancer Mother     breast  . Breast cancer Maternal Aunt   . Cancer Maternal Aunt     beast  . Hypertension Father    Patient Active Problem List   Diagnosis Date Noted  . Insomnia 12/07/2012  . Situational anxiety 10/17/2012  . Umbilical hernia 08/23/2012  . Vitamin D deficiency 08/12/2012  . History of cyst of breast 08/12/2012  . Anemia 07/28/2012  . Anxiety 07/28/2012  . HTN (hypertension) 07/28/2012  . Menorrhagia 07/28/2012  . S/P endometrial ablation 07/28/2012  . Family history of breast cancer in first degree relative 07/28/2012   Current Outpatient Prescriptions on File Prior to Visit  Medication Sig Dispense Refill  . cholecalciferol (VITAMIN D) 1000  UNITS tablet Take 1,000 Units by mouth daily.      Marland Kitchen HYDROCODONE-IBUPROFEN PO Take by mouth.        Marland Kitchen lisinopril-hydrochlorothiazide (PRINZIDE,ZESTORETIC) 20-25 MG per tablet Take 1 tablet by mouth daily.  90 tablet  1  . LORazepam (ATIVAN) 1 MG tablet Take one tablet 3 times a week for insomnia  12 tablet  1  . traMADol (ULTRAM) 50 MG tablet       . tranexamic acid (LYSTEDA) 650 MG TABS       . naproxen (NAPROSYN) 500 MG tablet Take 1 tablet (500 mg total) by mouth 2 (two) times daily with a meal.  30 tablet  1   No current facility-administered medications on file prior to visit.     Review of Systems See HPI    Objective:   Physical Exam  Physical Exam  Nursing note and vitals reviewed.  Constitutional: She is oriented to person, place, and time. She appears well-developed and well-nourished.  HENT:  Head: Normocephalic and atraumatic.  Cardiovascular: Normal rate and regular rhythm. Exam reveals no gallop and no friction rub.  No murmur heard.  Pulmonary/Chest: Breath sounds normal. She has no wheezes. She has no rales.  Neurological: She is alert and  oriented to person, place, and time.  Skin: Skin is warm and dry.  Psychiatric: She has a normal mood and affect. Her behavior is normal.             Assessment & Plan:  HTN will change to atenolol  25 mg bid  See me in 4 weeks  Anxiety     Insomnia  Ok to Use Ativan 1/2 to one tablet 3 times hs prn

## 2013-01-04 ENCOUNTER — Encounter: Payer: Self-pay | Admitting: *Deleted

## 2013-01-04 ENCOUNTER — Ambulatory Visit (INDEPENDENT_AMBULATORY_CARE_PROVIDER_SITE_OTHER): Payer: BC Managed Care – PPO | Admitting: Internal Medicine

## 2013-01-04 ENCOUNTER — Encounter: Payer: Self-pay | Admitting: Internal Medicine

## 2013-01-04 VITALS — BP 160/90 | HR 90 | Temp 98.2°F | Resp 18 | Wt 233.0 lb

## 2013-01-04 DIAGNOSIS — F418 Other specified anxiety disorders: Secondary | ICD-10-CM

## 2013-01-04 DIAGNOSIS — I10 Essential (primary) hypertension: Secondary | ICD-10-CM

## 2013-01-04 DIAGNOSIS — F411 Generalized anxiety disorder: Secondary | ICD-10-CM

## 2013-01-04 MED ORDER — SCOPOLAMINE 1 MG/3DAYS TD PT72: 1.0000 | MEDICATED_PATCH | TRANSDERMAL | Status: DC

## 2013-01-04 MED ORDER — LISINOPRIL 40 MG PO TABS: 40.0000 mg | ORAL_TABLET | Freq: Every day | ORAL | Status: DC

## 2013-01-04 MED ORDER — FUROSEMIDE 20 MG PO TABS: 20.0000 mg | ORAL_TABLET | Freq: Every day | ORAL | Status: DC

## 2013-01-04 NOTE — Patient Instructions (Signed)
Buy Dramamine  Over the counter   Take lisinopril  40 mg every morning  Take Lasix (furosemide)  20 mg every morning  Water pill  Take atenolol  25 mg in moringing  With other pills and take again in evening  See me  On December 22nd

## 2013-01-04 NOTE — Progress Notes (Signed)
Subjective:    Patient ID: [name], female    DOB: [birth year], 42 y.o.   MRN: [id number]  HPI  Arlenis is here for follow up on her BP.  She is asymptomatic    Lots of stress at work and home.  She is undergoing a divorce and this stresses her  No Known Allergies Past Medical History  Diagnosis Date  . Hypertension   . Insulin resistance    Past Surgical History  Procedure Laterality Date  . Novasure ablation  06-20-2008  . Hysteroscopy w/d&c  06-20-2008  . Tubal ligation  06-20-2008  . Cryotherapy  1989  . Cesarean section    . Tmj arthroplasty  1998   History   Social History  . Marital Status: Divorced    Spouse Name: N/A    Number of Children: N/A  . Years of Education: N/A   Occupational History  . Not on file.   Social History Main Topics  . Smoking status: Never Smoker   . Smokeless tobacco: Never Used  . Alcohol Use: No  . Drug Use: No  . Sexual Activity: Yes    Birth Control/ Protection: Surgical   Other Topics Concern  . Not on file   Social History Narrative  . No narrative on file   Family History  Problem Relation Age of Onset  . Breast cancer Mother   . Hypertension Mother   . Diabetes Mother   . Cancer Mother     breast  . Breast cancer Maternal Aunt   . Cancer Maternal Aunt     beast  . Hypertension Father    Patient Active Problem List   Diagnosis Date Noted  . Insomnia 12/07/2012  . Situational anxiety 10/17/2012  . Umbilical hernia 08/23/2012  . Vitamin D deficiency 08/12/2012  . History of cyst of breast 08/12/2012  . Anemia 07/28/2012  . Anxiety 07/28/2012  . HTN (hypertension) 07/28/2012  . Menorrhagia 07/28/2012  . S/P endometrial ablation 07/28/2012  . Family history of breast cancer in first degree relative 07/28/2012   Current Outpatient Prescriptions on File Prior to Visit  Medication Sig Dispense Refill  . atenolol (TENORMIN) 25 MG tablet Take 1 tablet (25 mg total) by mouth 2 (two) times daily.  60 tablet   3  . cholecalciferol (VITAMIN D) 1000 UNITS tablet Take 1,000 Units by mouth daily.      Marland Kitchen HYDROCODONE-IBUPROFEN PO Take by mouth.        Marland Kitchen LORazepam (ATIVAN) 1 MG tablet Take one tablet 3 times a week for insomnia  12 tablet  1  . naproxen (NAPROSYN) 500 MG tablet Take 1 tablet (500 mg total) by mouth 2 (two) times daily with a meal.  30 tablet  1  . traMADol (ULTRAM) 50 MG tablet       . tranexamic acid (LYSTEDA) 650 MG TABS        No current facility-administered medications on file prior to visit.     Review of Systems     Objective:   Physical Exam Physical Exam  Nursing note and vitals reviewed.   Repeat BP  160/90 Constitutional: She is oriented to person, place, and time. She appears well-developed and well-nourished.  HENT:  Head: Normocephalic and atraumatic.  Cardiovascular: Normal rate and regular rhythm. Exam reveals no gallop and no friction rub.  No murmur heard.  Pulmonary/Chest: Breath sounds normal. She has no wheezes. She has no rales.  Neurological: She is alert and oriented to  person, place, and time.  Skin: Skin is warm and dry.  Psychiatric: She has a normal mood and affect. Her behavior is normal.              Assessment & Plan:  HTN  Uncontrolled  Will increase Lisinopril to 40 mg,  Stop HCTZ and change to lasix 20 mg daily,  Continue  Atenolol 25 bid.  See me Dec 22nd or sooner prn  Situational stress

## 2013-01-16 ENCOUNTER — Ambulatory Visit (INDEPENDENT_AMBULATORY_CARE_PROVIDER_SITE_OTHER): Payer: BC Managed Care – PPO | Admitting: Internal Medicine

## 2013-01-16 ENCOUNTER — Encounter: Payer: Self-pay | Admitting: Internal Medicine

## 2013-01-16 VITALS — BP 138/88 | HR 82 | Temp 97.4°F | Resp 18 | Wt 229.0 lb

## 2013-01-16 DIAGNOSIS — T50905A Adverse effect of unspecified drugs, medicaments and biological substances, initial encounter: Secondary | ICD-10-CM

## 2013-01-16 DIAGNOSIS — Z7189 Other specified counseling: Secondary | ICD-10-CM

## 2013-01-16 DIAGNOSIS — I10 Essential (primary) hypertension: Secondary | ICD-10-CM

## 2013-01-16 DIAGNOSIS — Z630 Problems in relationship with spouse or partner: Secondary | ICD-10-CM

## 2013-01-16 LAB — BASIC METABOLIC PANEL
BUN: 12 mg/dL (ref 6–23)
CO2: 26 mEq/L (ref 19–32)
Calcium: 9.1 mg/dL (ref 8.4–10.5)
Chloride: 103 mEq/L (ref 96–112)
Creat: 0.71 mg/dL (ref 0.50–1.10)
Glucose, Bld: 90 mg/dL (ref 70–99)
Potassium: 4 mEq/L (ref 3.5–5.3)
Sodium: 139 mEq/L (ref 135–145)

## 2013-01-16 MED ORDER — POTASSIUM CHLORIDE ER 20 MEQ PO TBCR: 10.0000 meq | EXTENDED_RELEASE_TABLET | Freq: Every day | ORAL | Status: DC

## 2013-01-16 NOTE — Patient Instructions (Signed)
See me in 2 months 

## 2013-01-16 NOTE — Progress Notes (Signed)
Subjective:    Patient ID: [name], female    DOB: 12-08-1970, 42 y.o.   MRN: [id number]  HPI  [name]  Is here for follow up for htn  I have increased her Lisinopril to 40 mg and added lasix 20 mg daily.   HCTZ has been stopped  Tolerating well   BP at home  130's over 80-90.    She is undergoing divorce and very stressed with ex husband over the holidays  No Known Allergies Past Medical History  Diagnosis Date  . Hypertension   . Insulin resistance    Past Surgical History  Procedure Laterality Date  . Novasure ablation  06-20-2008  . Hysteroscopy w/d&c  06-20-2008  . Tubal ligation  06-20-2008  . Cryotherapy  1989  . Cesarean section    . Tmj arthroplasty  1998   History   Social History  . Marital Status: Divorced    Spouse Name: N/A    Number of Children: N/A  . Years of Education: N/A   Occupational History  . Not on file.   Social History Main Topics  . Smoking status: Never Smoker   . Smokeless tobacco: Never Used  . Alcohol Use: No  . Drug Use: No  . Sexual Activity: Yes    Birth Control/ Protection: Surgical   Other Topics Concern  . Not on file   Social History Narrative  . No narrative on file   Family History  Problem Relation Age of Onset  . Breast cancer Mother   . Hypertension Mother   . Diabetes Mother   . Cancer Mother     breast  . Breast cancer Maternal Aunt   . Cancer Maternal Aunt     beast  . Hypertension Father    Patient Active Problem List   Diagnosis Date Noted  . Insomnia 12/07/2012  . Situational anxiety 10/17/2012  . Umbilical hernia 08/23/2012  . Vitamin D deficiency 08/12/2012  . History of cyst of breast 08/12/2012  . Anemia 07/28/2012  . Anxiety 07/28/2012  . HTN (hypertension) 07/28/2012  . Menorrhagia 07/28/2012  . S/P endometrial ablation 07/28/2012  . Family history of breast cancer in first degree relative 07/28/2012   Current Outpatient Prescriptions on File Prior to Visit  Medication Sig  Dispense Refill  . atenolol (TENORMIN) 25 MG tablet Take 1 tablet (25 mg total) by mouth 2 (two) times daily.  60 tablet  3  . cholecalciferol (VITAMIN D) 1000 UNITS tablet Take 1,000 Units by mouth daily.      . furosemide (LASIX) 20 MG tablet Take 1 tablet (20 mg total) by mouth daily.  30 tablet  3  . lisinopril (PRINIVIL,ZESTRIL) 40 MG tablet Take 1 tablet (40 mg total) by mouth daily.  90 tablet  3  . LORazepam (ATIVAN) 1 MG tablet Take one tablet 3 times a week for insomnia  12 tablet  1  . naproxen (NAPROSYN) 500 MG tablet Take 1 tablet (500 mg total) by mouth 2 (two) times daily with a meal.  30 tablet  1  . tranexamic acid (LYSTEDA) 650 MG TABS       . HYDROCODONE-IBUPROFEN PO Take by mouth.        Marland Kitchen scopolamine (TRANSDERM-SCOP) 1.5 MG Place 1 patch (1.5 mg total) onto the skin every 3 (three) days.  1 patch  0  . traMADol (ULTRAM) 50 MG tablet        No current facility-administered medications on file prior to visit.  Review of Systems See HPI    Objective:   Physical Exam Physical Exam  Nursing note and vitals reviewed.   Repeat BP  138/88 Constitutional: She is oriented to person, place, and time. She appears well-developed and well-nourished.  HENT:  Head: Normocephalic and atraumatic.  Cardiovascular: Normal rate and regular rhythm. Exam reveals no gallop and no friction rub.  No murmur heard.  Pulmonary/Chest: Breath sounds normal. She has no wheezes. She has no rales.  Neurological: She is alert and oriented to person, place, and time.  Skin: Skin is warm and dry.  Psychiatric: She has a normal mood and affect. Her behavior is normal.        Assessment & Plan:  HTN:   Continue meds as above  Check K today.  Will add K-dur 20 meq daily  See me in two months  Med sided effect  Check K today  Marital discord

## 2013-01-23 ENCOUNTER — Encounter: Payer: Self-pay | Admitting: *Deleted

## 2013-02-05 ENCOUNTER — Other Ambulatory Visit: Payer: Self-pay | Admitting: Internal Medicine

## 2013-02-06 NOTE — Telephone Encounter (Signed)
Refill request for Ativan

## 2013-02-07 ENCOUNTER — Other Ambulatory Visit: Payer: Self-pay | Admitting: Internal Medicine

## 2013-02-07 MED ORDER — LORAZEPAM 1 MG PO TABS: ORAL_TABLET | ORAL | Status: DC

## 2013-03-22 ENCOUNTER — Ambulatory Visit: Payer: BC Managed Care – PPO | Admitting: Internal Medicine

## 2013-04-04 ENCOUNTER — Telehealth: Payer: Self-pay | Admitting: *Deleted

## 2013-04-04 NOTE — Telephone Encounter (Signed)
Found pt RX at desk called LVM message for pt to return call

## 2013-04-13 ENCOUNTER — Other Ambulatory Visit: Payer: Self-pay | Admitting: *Deleted

## 2013-04-13 NOTE — Telephone Encounter (Signed)
Called in ativan to [facility]. Pt never picked written RX up from 02/07/13.

## 2013-05-01 ENCOUNTER — Encounter: Payer: Self-pay | Admitting: Internal Medicine

## 2013-05-01 ENCOUNTER — Ambulatory Visit (INDEPENDENT_AMBULATORY_CARE_PROVIDER_SITE_OTHER): Payer: BC Managed Care – PPO | Admitting: Internal Medicine

## 2013-05-01 VITALS — BP 130/82 | HR 82 | Temp 98.4°F | Resp 18 | Wt 235.0 lb

## 2013-05-01 DIAGNOSIS — N949 Unspecified condition associated with female genital organs and menstrual cycle: Secondary | ICD-10-CM

## 2013-05-01 DIAGNOSIS — F419 Anxiety disorder, unspecified: Secondary | ICD-10-CM

## 2013-05-01 DIAGNOSIS — R102 Pelvic and perineal pain: Secondary | ICD-10-CM | POA: Insufficient documentation

## 2013-05-01 DIAGNOSIS — I10 Essential (primary) hypertension: Secondary | ICD-10-CM

## 2013-05-01 DIAGNOSIS — F411 Generalized anxiety disorder: Secondary | ICD-10-CM

## 2013-05-01 LAB — BASIC METABOLIC PANEL
BUN: 10 mg/dL (ref 6–23)
CO2: 26 mEq/L (ref 19–32)
Calcium: 8.9 mg/dL (ref 8.4–10.5)
Chloride: 104 mEq/L (ref 96–112)
Creat: 0.81 mg/dL (ref 0.50–1.10)
Glucose, Bld: 87 mg/dL (ref 70–99)
Potassium: 4.3 mEq/L (ref 3.5–5.3)
Sodium: 139 mEq/L (ref 135–145)

## 2013-05-01 NOTE — Patient Instructions (Signed)
See me as needed 

## 2013-05-01 NOTE — Progress Notes (Signed)
Subjective:    Patient ID: [name], female    DOB: [birth year], 43 y.o.   MRN: [id number]  HPI  [name] is here for follow up of her HTN  I added Lasix with K supplement to her lisinopril.  Toelrating fine  No headache or visual change  She has painful menses which is managed by Dr. Garwin Chandler.  She take Lysteda and hydrocodone for this.  Pt reports her GYN has recommended a hysterectomy but she is not ready for this.    No Known Allergies Past Medical History  Diagnosis Date  . Hypertension   . Insulin resistance    Past Surgical History  Procedure Laterality Date  . Novasure ablation  06-20-2008  . Hysteroscopy w/d&c  06-20-2008  . Tubal ligation  06-20-2008  . Cryotherapy  1989  . Cesarean section    . Tmj arthroplasty  1998   History   Social History  . Marital Status: Divorced    Spouse Name: N/A    Number of Children: N/A  . Years of Education: N/A   Occupational History  . Not on file.   Social History Main Topics  . Smoking status: Never Smoker   . Smokeless tobacco: Never Used  . Alcohol Use: No  . Drug Use: No  . Sexual Activity: Yes    Birth Control/ Protection: Surgical   Other Topics Concern  . Not on file   Social History Narrative  . No narrative on file   Family History  Problem Relation Age of Onset  . Breast cancer Mother   . Hypertension Mother   . Diabetes Mother   . Cancer Mother     breast  . Breast cancer Maternal Aunt   . Cancer Maternal Aunt     beast  . Hypertension Father    Patient Active Problem List   Diagnosis Date Noted  . Insomnia 12/07/2012  . Situational anxiety 10/17/2012  . Umbilical hernia 58/09/9831  . Vitamin D deficiency 08/12/2012  . History of cyst of breast 08/12/2012  . Anemia 07/28/2012  . Anxiety 07/28/2012  . HTN (hypertension) 07/28/2012  . Menorrhagia 07/28/2012  . S/P endometrial ablation 07/28/2012  . Family history of breast cancer in first degree relative 07/28/2012   Current Outpatient  Prescriptions on File Prior to Visit  Medication Sig Dispense Refill  . atenolol (TENORMIN) 25 MG tablet Take 1 tablet (25 mg total) by mouth 2 (two) times daily.  60 tablet  3  . cholecalciferol (VITAMIN D) 1000 UNITS tablet Take 1,000 Units by mouth daily.      . furosemide (LASIX) 20 MG tablet Take 1 tablet (20 mg total) by mouth daily.  30 tablet  3  . lisinopril (PRINIVIL,ZESTRIL) 40 MG tablet Take 1 tablet (40 mg total) by mouth daily.  90 tablet  3  . LORazepam (ATIVAN) 1 MG tablet Take one tablet at HS three times a week for insomnia  12 tablet  0  . potassium chloride 20 MEQ TBCR Take 10 mEq by mouth daily.  30 tablet  3  . tranexamic acid (LYSTEDA) 650 MG TABS       . HYDROCODONE-IBUPROFEN PO Take by mouth.        . naproxen (NAPROSYN) 500 MG tablet Take 1 tablet (500 mg total) by mouth 2 (two) times daily with a meal.  30 tablet  1  . traMADol (ULTRAM) 50 MG tablet        No current facility-administered medications on file  prior to visit.       Review of Systems    see HPI Objective:   Physical Exam  Physical Exam  Nursing note and vitals reviewed.   See repeat BP Constitutional: She is oriented to person, place, and time. She appears well-developed and well-nourished.  HENT:  Head: Normocephalic and atraumatic.  Cardiovascular: Normal rate and regular rhythm. Exam reveals no gallop and no friction rub.  No murmur heard.  Pulmonary/Chest: Breath sounds normal. She has no wheezes. She has no rales.  Neurological: She is alert and oriented to person, place, and time.  Skin: Skin is warm and dry.  Ext  No edema Psychiatric: She has a normal mood and affect. Her behavior is normal.          Assessment & Plan:  HTN  Will  Check BMP today  Continue  Lisinopril, atenolol, Lasix and K  Dysmenorrhea   To continue management with Dr. Garwin Chandler.     See me as needed   Schedule CPE

## 2013-05-02 ENCOUNTER — Encounter: Payer: Self-pay | Admitting: Internal Medicine

## 2013-08-08 ENCOUNTER — Other Ambulatory Visit: Payer: Self-pay | Admitting: *Deleted

## 2013-08-08 ENCOUNTER — Telehealth: Payer: Self-pay | Admitting: *Deleted

## 2013-08-08 NOTE — Telephone Encounter (Signed)
Please send all refill requests through [facility]

## 2013-08-08 NOTE — Telephone Encounter (Signed)
Needs refill of Lorazepam 1 mg tablets for insomnia.  [facility] on [address].

## 2013-08-08 NOTE — Telephone Encounter (Signed)
Lorazepam 1 mg tablets for insomnia. [facility] on [address]

## 2013-08-09 ENCOUNTER — Other Ambulatory Visit: Payer: Self-pay | Admitting: Internal Medicine

## 2013-08-09 MED ORDER — LORAZEPAM 1 MG PO TABS: ORAL_TABLET | ORAL | Status: DC

## 2013-08-10 ENCOUNTER — Telehealth: Payer: Self-pay | Admitting: *Deleted

## 2013-08-10 NOTE — Telephone Encounter (Signed)
Sent req to Dr. Chauncey Cruel for approval

## 2013-08-10 NOTE — Telephone Encounter (Signed)
Called Ativan into [facility] on [address]

## 2013-08-10 NOTE — Telephone Encounter (Signed)
Needs refills lisinopril atenolol Furosemide LORazepam  [facility] on [address]

## 2013-08-13 MED ORDER — FUROSEMIDE 20 MG PO TABS: 20.0000 mg | ORAL_TABLET | Freq: Every day | ORAL | Status: DC

## 2013-08-13 MED ORDER — LISINOPRIL 40 MG PO TABS: 40.0000 mg | ORAL_TABLET | Freq: Every day | ORAL | Status: DC

## 2013-08-13 MED ORDER — ATENOLOL 25 MG PO TABS: 25.0000 mg | ORAL_TABLET | Freq: Two times a day (BID) | ORAL | Status: DC

## 2013-08-24 ENCOUNTER — Ambulatory Visit: Payer: BC Managed Care – PPO | Admitting: Internal Medicine

## 2013-08-29 ENCOUNTER — Other Ambulatory Visit: Payer: Self-pay | Admitting: *Deleted

## 2013-08-29 DIAGNOSIS — Z7721 Contact with and (suspected) exposure to potentially hazardous body fluids: Secondary | ICD-10-CM

## 2013-08-30 LAB — MEASLES/MUMPS/RUBELLA IMMUNITY
Mumps IgG: 110 AU/mL — ABNORMAL HIGH (ref <9.00)
Rubella: 1.78 Index — ABNORMAL HIGH (ref <0.90)
Rubeola IgG: 14.8 AU/mL (ref <25.00)

## 2013-08-30 LAB — HEPATITIS B SURFACE ANTIBODY, QUANTITATIVE: Hepatitis B-Post: 0 m[IU]/mL

## 2013-09-05 ENCOUNTER — Other Ambulatory Visit: Payer: Self-pay | Admitting: *Deleted

## 2013-09-05 ENCOUNTER — Ambulatory Visit (INDEPENDENT_AMBULATORY_CARE_PROVIDER_SITE_OTHER): Payer: BC Managed Care – PPO | Admitting: Internal Medicine

## 2013-09-05 ENCOUNTER — Encounter: Payer: Self-pay | Admitting: Internal Medicine

## 2013-09-05 VITALS — BP 150/90 | HR 89 | Temp 98.1°F | Resp 17 | Ht 68.0 in | Wt 231.0 lb

## 2013-09-05 DIAGNOSIS — Z23 Encounter for immunization: Secondary | ICD-10-CM | POA: Diagnosis not present

## 2013-09-05 DIAGNOSIS — Z111 Encounter for screening for respiratory tuberculosis: Secondary | ICD-10-CM | POA: Diagnosis not present

## 2013-09-05 DIAGNOSIS — F411 Generalized anxiety disorder: Secondary | ICD-10-CM

## 2013-09-05 DIAGNOSIS — F418 Other specified anxiety disorders: Secondary | ICD-10-CM

## 2013-09-05 DIAGNOSIS — I10 Essential (primary) hypertension: Secondary | ICD-10-CM | POA: Diagnosis not present

## 2013-09-05 DIAGNOSIS — Z0189 Encounter for other specified special examinations: Secondary | ICD-10-CM

## 2013-09-05 NOTE — Patient Instructions (Signed)
See me in 4-6 weeks

## 2013-09-05 NOTE — Progress Notes (Signed)
   Subjective:    Patient ID: [name], female    DOB: Sep 09, 1970, 43 y.o.   MRN: [id number]  HPI  Yanely is here for follow up and she needs form for her new job with [employer]  It has been 10 years since her tetanus,  See mmr  She is not immmune to rebeola or hepatitis B  .  Quantiferon pending  See BP   She tells me she is not taking her Atenolol bid as ordered.    She had a fight with her uncle prior to coming to office visit.    She is asymptomatic    She is happy as she has lost 4 lbs since last visit by exercising and watching diet.  She is dating and things are better with her anxiety  Review of Systems     Objective:   Physical Exam Physical Exam  Nursing note and vitals reviewed.   See visual acuity   Repeat Bp  150/90 Constitutional: She is oriented to person, place, and time. She appears well-developed and well-nourished.  HENT:  Head: Normocephalic and atraumatic.  Cardiovascular: Normal rate and regular rhythm. Exam reveals no gallop and no friction rub.  No murmur heard.  Pulmonary/Chest: Breath sounds normal. She has no wheezes. She has no rales.  Neurological: She is alert and oriented to person, place, and time.  Skin: Skin is warm and dry.  Psychiatric: She has a normal mood and affect. Her behavior is normal.              Assessment & Plan:  HTN  Not controlled    She is not taking her atenolol bid as she forgets evenng dose.  Advised to take two atenolol 25 mg in am ,  Lasix and K and lisinopril 40 mg .  See me in 4-6 weeks for recheck  Sitruational anxiety improved  HM:   Will get quantiferon lab today.  Give first dose of hepatitis B and TDap today .  I gave RX for MMR to be obtained at her pharmacy

## 2013-09-06 DIAGNOSIS — Z23 Encounter for immunization: Secondary | ICD-10-CM | POA: Diagnosis not present

## 2013-09-06 MED ORDER — HEPATITIS B VAC RECOMBINANT 5 MCG/0.5ML IJ SUSP: 0.5000 mL | Freq: Once | INTRAMUSCULAR | Status: DC

## 2013-09-06 NOTE — Addendum Note (Signed)
Addended by: Sherrye Payor on: 09/06/2013 02:12 PM   Modules accepted: Orders

## 2013-09-07 ENCOUNTER — Other Ambulatory Visit: Payer: Self-pay | Admitting: Internal Medicine

## 2013-09-07 LAB — QUANTIFERON TB GOLD ASSAY (BLOOD)
Interferon Gamma Release Assay: NEGATIVE
Mitogen value: >10 IU/mL
Quantiferon Nil Value: 0.02 IU/mL
Quantiferon Tb Ag Minus Nil Value: 0.03 IU/mL
TB Ag value: 0.05 IU/mL

## 2013-09-07 NOTE — Telephone Encounter (Signed)
Requested Medications     Medication name:  Name from pharmacy:  atenolol (TENORMIN) 25 MG tablet  ATENOLOL 25MG  TABLETS    The source prescription has been discontinued.    Sig: TAKE 1 TABLET BY MOUTH TWICE DAILY    Dispense: 60 tablet Refills: 0 Start: 09/07/2013  Class: Normal    Requested on: 09/07/2013    Originally ordered on: 12/07/2012 Last refill: 08/10/2013 Order History and Details           Medication name:  Name from pharmacy:  furosemide (LASIX) 20 MG tablet  FUROSEMIDE 20MG  TABLETS    The source prescription has been discontinued.    Sig: TAKE 1 TABLET BY MOUTH DAILY    Dispense: 30 tablet Refills: 0 Start: 09/07/2013  Class: Normal    Requested on: 09/07/2013

## 2013-09-11 ENCOUNTER — Ambulatory Visit (INDEPENDENT_AMBULATORY_CARE_PROVIDER_SITE_OTHER): Payer: BC Managed Care – PPO | Admitting: *Deleted

## 2013-09-11 DIAGNOSIS — Z9229 Personal history of other drug therapy: Secondary | ICD-10-CM

## 2013-09-12 DIAGNOSIS — Z23 Encounter for immunization: Secondary | ICD-10-CM

## 2013-10-11 ENCOUNTER — Ambulatory Visit (INDEPENDENT_AMBULATORY_CARE_PROVIDER_SITE_OTHER): Payer: BC Managed Care – PPO | Admitting: *Deleted

## 2013-10-11 DIAGNOSIS — Z23 Encounter for immunization: Secondary | ICD-10-CM

## 2014-01-24 ENCOUNTER — Other Ambulatory Visit: Payer: Self-pay | Admitting: Obstetrics and Gynecology

## 2014-01-24 DIAGNOSIS — N6001 Solitary cyst of right breast: Secondary | ICD-10-CM

## 2014-02-01 ENCOUNTER — Ambulatory Visit: Payer: BC Managed Care – PPO | Admitting: Internal Medicine

## 2014-02-06 ENCOUNTER — Other Ambulatory Visit: Payer: BC Managed Care – PPO

## 2014-03-29 ENCOUNTER — Ambulatory Visit: Payer: BC Managed Care – PPO | Admitting: Internal Medicine

## 2014-03-29 ENCOUNTER — Other Ambulatory Visit: Payer: Self-pay | Admitting: *Deleted

## 2014-03-29 ENCOUNTER — Ambulatory Visit (INDEPENDENT_AMBULATORY_CARE_PROVIDER_SITE_OTHER): Payer: BC Managed Care – PPO | Admitting: Internal Medicine

## 2014-03-29 ENCOUNTER — Encounter: Payer: Self-pay | Admitting: Internal Medicine

## 2014-03-29 VITALS — BP 156/86 | HR 89 | Resp 16 | Ht 66.0 in | Wt 224.0 lb

## 2014-03-29 DIAGNOSIS — D649 Anemia, unspecified: Secondary | ICD-10-CM | POA: Diagnosis not present

## 2014-03-29 DIAGNOSIS — E559 Vitamin D deficiency, unspecified: Secondary | ICD-10-CM | POA: Diagnosis not present

## 2014-03-29 DIAGNOSIS — J0100 Acute maxillary sinusitis, unspecified: Secondary | ICD-10-CM

## 2014-03-29 DIAGNOSIS — I10 Essential (primary) hypertension: Secondary | ICD-10-CM | POA: Diagnosis not present

## 2014-03-29 MED ORDER — LISINOPRIL 40 MG PO TABS: 40.0000 mg | ORAL_TABLET | Freq: Every day | ORAL | Status: DC

## 2014-03-29 MED ORDER — ATENOLOL 25 MG PO TABS: 25.0000 mg | ORAL_TABLET | Freq: Two times a day (BID) | ORAL | Status: DC

## 2014-03-29 MED ORDER — AZITHROMYCIN 250 MG PO TABS: ORAL_TABLET | ORAL | Status: DC

## 2014-03-29 MED ORDER — LORAZEPAM 1 MG PO TABS: 1.0000 mg | ORAL_TABLET | Freq: Every day | ORAL | Status: DC

## 2014-03-29 MED ORDER — FUROSEMIDE 20 MG PO TABS: 20.0000 mg | ORAL_TABLET | Freq: Every day | ORAL | Status: DC

## 2014-03-29 NOTE — Progress Notes (Signed)
Subjective:    Patient ID: [name], female    DOB: 1970-03-27, 44 y.o.   MRN: [id number]  HPI 09/05/2013 note Assessment & Plan:  HTN Not controlled She is not taking her atenolol bid as she forgets evenng dose. Advised to take two atenolol 25 mg in am , Lasix and K and lisinopril 40 mg . See me in 4-6 weeks for recheck  Sitruational anxiety improved  HM: Will get quantiferon lab today. Give first dose of hepatitis B and TDap today . I gave RX for MMR to be obtained at her pharmacy        [name] is here for acute visit and to follow up on HTN  HTN  She tells me she is not taking her BP meds  "like I should"  She is out of most ofher BP meds  Nasal congestion and sinus pressure for the past 2 weeks  She reports heavy menses and chalk craving last seversal months.  She is S/P endometrial ablation .  She reports she has an appt with [name] her gyn later this month   She is on menses now  No Known Allergies Past Medical History  Diagnosis Date  . Hypertension   . Insulin resistance    Past Surgical History  Procedure Laterality Date  . Novasure ablation  06-20-2008  . Hysteroscopy w/d&c  06-20-2008  . Tubal ligation  06-20-2008  . Cryotherapy  1989  . Cesarean section    . Tmj arthroplasty  1998   History   Social History  . Marital Status: Divorced    Spouse Name: N/A  . Number of Children: N/A  . Years of Education: N/A   Occupational History  . Not on file.   Social History Main Topics  . Smoking status: Never Smoker   . Smokeless tobacco: Never Used  . Alcohol Use: No  . Drug Use: No  . Sexual Activity: Yes    Birth Control/ Protection: Surgical   Other Topics Concern  . Not on file   Social History Narrative  . No narrative on file   Family History  Problem Relation Age of Onset  . Breast cancer Mother   . Hypertension Mother   . Diabetes Mother   . Cancer Mother     breast  . Breast cancer Maternal Aunt   .  Cancer Maternal Aunt     beast  . Hypertension Father    Patient Active Problem List   Diagnosis Date Noted  . Pelvic pain in female 05/01/2013  . Insomnia 12/07/2012  . Situational anxiety 10/17/2012  . Umbilical hernia 04/59/9774  . Vitamin D deficiency 08/12/2012  . History of cyst of breast 08/12/2012  . Anemia 07/28/2012  . Anxiety 07/28/2012  . HTN (hypertension) 07/28/2012  . Menorrhagia 07/28/2012  . S/P endometrial ablation 07/28/2012  . Family history of breast cancer in first degree relative 07/28/2012   Current Outpatient Prescriptions on File Prior to Visit  Medication Sig Dispense Refill  . atenolol (TENORMIN) 25 MG tablet Take 1 tablet (25 mg total) by mouth 2 (two) times daily. 180 tablet 0  . atenolol (TENORMIN) 25 MG tablet Take two tablets in am 180 tablet 1  . cholecalciferol (VITAMIN D) 1000 UNITS tablet Take 1,000 Units by mouth daily.    . furosemide (LASIX) 20 MG tablet Take 1 tablet (20 mg total) by mouth daily. 90 tablet 0  . furosemide (LASIX) 20 MG tablet TAKE 1 TABLET  BY MOUTH DAILY 30 tablet 1  . HYDROCODONE-IBUPROFEN PO Take by mouth.      Marland Kitchen lisinopril (PRINIVIL,ZESTRIL) 40 MG tablet Take 1 tablet (40 mg total) by mouth daily. 90 tablet 0  . LORazepam (ATIVAN) 1 MG tablet Take one tablet at HS three times a week for insomnia 12 tablet 1  . naproxen (NAPROSYN) 500 MG tablet Take 1 tablet (500 mg total) by mouth 2 (two) times daily with a meal. 30 tablet 1  . potassium chloride 20 MEQ TBCR Take 10 mEq by mouth daily. 30 tablet 3  . traMADol (ULTRAM) 50 MG tablet     . tranexamic acid (LYSTEDA) 650 MG TABS      Current Facility-Administered Medications on File Prior to Visit  Medication Dose Route Frequency Provider Last Rate Last Dose  . hepatitis B vac recombinant for pediatrics (RECOMBIVAX) injection 5 mcg  0.5 mL Intramuscular Once Lanice Shirts, MD           Review of Systems See HPI    Objective:   Physical Exam  Physical Exam    Constitutional: She is oriented to person, place, and time. She appears well-developed and well-nourished. She is cooperative.  HENT:  Head: Normocephalic and atraumatic.  Right Ear: A middle ear effusion is present.  Left Ear: A middle ear effusion is present.  Nose: Mucosal edema present. Right sinus exhibits maxillary sinus tenderness. Left sinus exhibits maxillary sinus tenderness.  Mouth/Throat: Posterior oropharyngeal erythema present.  Serous effusion bilaterally  Eyes: Conjunctivae and EOM are normal. Pupils are equal, round, and reactive to light.  Neck: Neck supple. Carotid bruit is not present. No mass present.  Cardiovascular: Regular rhythm, normal heart sounds, intact distal pulses and normal pulses. Exam reveals no gallop and no friction rub.  No murmur heard.  Pulmonary/Chest: Breath sounds normal. She has no wheezes. She has no rhonchi. She has no rales.  Neurological: She is alert and oriented to person, place, and time.  Skin: Skin is warm and dry. No abrasion, no bruising, no ecchymosis and no rash noted. No cyanosis. Nails show no clubbing.  Psychiatric: She has a normal mood and affect. Her speech is normal and behavior is normal.               Assessment & Plan:  HTN   Advised to be sure to take all meds daily and K when she takes her lasix  Menorrhagia will check CBC, anemia panel and TSH  She has appt with GYN later this month  Sinusitis  Ok for Z-pak    See me as needed

## 2014-03-29 NOTE — Addendum Note (Signed)
Addended by: Emi Belfast D on: 03/29/2014 01:19 PM   Modules accepted: Orders

## 2014-05-14 ENCOUNTER — Ambulatory Visit
Admission: RE | Admit: 2014-05-14 | Discharge: 2014-05-14 | Disposition: A | Discharge status: Home / Self Care | Payer: BC Managed Care – PPO | Source: Ambulatory Visit | Attending: Obstetrics and Gynecology | Admitting: Obstetrics and Gynecology

## 2014-05-14 DIAGNOSIS — N6001 Solitary cyst of right breast: Secondary | ICD-10-CM

## 2014-05-27 LAB — HM PAP SMEAR

## 2014-06-18 ENCOUNTER — Other Ambulatory Visit: Payer: Self-pay | Admitting: Obstetrics and Gynecology

## 2014-06-27 ENCOUNTER — Inpatient Hospital Stay (HOSPITAL_COMMUNITY)
Admission: AD | Admit: 2014-06-27 | Discharge: 2014-06-27 | Disposition: A | Discharge status: Home / Self Care | Payer: BC Managed Care – PPO | Source: Ambulatory Visit | Attending: Obstetrics and Gynecology | Admitting: Obstetrics and Gynecology

## 2014-06-27 DIAGNOSIS — D649 Anemia, unspecified: Secondary | ICD-10-CM | POA: Diagnosis present

## 2014-06-27 DIAGNOSIS — I10 Essential (primary) hypertension: Secondary | ICD-10-CM | POA: Diagnosis not present

## 2014-06-27 DIAGNOSIS — R102 Pelvic and perineal pain: Secondary | ICD-10-CM | POA: Insufficient documentation

## 2014-06-27 DIAGNOSIS — N920 Excessive and frequent menstruation with regular cycle: Secondary | ICD-10-CM | POA: Insufficient documentation

## 2014-06-27 DIAGNOSIS — E559 Vitamin D deficiency, unspecified: Secondary | ICD-10-CM | POA: Diagnosis not present

## 2014-06-27 MED ORDER — FERUMOXYTOL INJECTION 510 MG/17 ML
510.0000 mg | INTRAVENOUS | Status: DC
Start: 2014-06-27 — End: 2014-06-27
  Administered 2014-06-27: 510 mg via INTRAVENOUS
  Filled 2014-06-27: qty 17

## 2014-07-25 ENCOUNTER — Other Ambulatory Visit: Payer: Self-pay | Admitting: General Surgery

## 2014-08-02 ENCOUNTER — Other Ambulatory Visit: Payer: Self-pay | Admitting: Obstetrics and Gynecology

## 2014-08-08 ENCOUNTER — Inpatient Hospital Stay (HOSPITAL_COMMUNITY)
Admission: AD | Admit: 2014-08-08 | Discharge: 2014-08-08 | Disposition: A | Discharge status: Home / Self Care | Payer: BC Managed Care – PPO | Source: Ambulatory Visit | Attending: Obstetrics & Gynecology | Admitting: Obstetrics & Gynecology

## 2014-08-08 DIAGNOSIS — D649 Anemia, unspecified: Secondary | ICD-10-CM | POA: Diagnosis present

## 2014-08-08 MED ORDER — SODIUM CHLORIDE 0.9 % IV SOLN
510.0000 mg | Freq: Once | INTRAVENOUS | Status: AC
  Administered 2014-08-08: 510 mg via INTRAVENOUS
  Filled 2014-08-08: qty 17

## 2014-08-08 NOTE — MAU Note (Signed)
Patient sent by physician for an IV iron infusion.

## 2014-08-08 NOTE — MAU Note (Signed)
Pt tolerated iron infusion. No s/s of reaction.

## 2014-08-15 ENCOUNTER — Other Ambulatory Visit: Payer: Self-pay | Admitting: Obstetrics and Gynecology

## 2014-08-23 NOTE — Patient Instructions (Addendum)
YOUR PROCEDURE IS SCHEDULED ON :  08/30/14  REPORT TO [facility] MAIN ENTRANCE FOLLOW SIGNS TO EAST ELEVATOR - GO TO 3rd FLOOR CHECK IN AT 3 EAST NURSES STATION (SHORT STAY) AT:  7:00 AM  CALL THIS NUMBER IF YOU HAVE PROBLEMS THE MORNING OF SURGERY [phone]  REMEMBER:ONLY 1 PER PERSON MAY GO TO SHORT STAY WITH YOU TO GET READY THE MORNING OF YOUR SURGERY  DO NOT EAT FOOD OR DRINK LIQUIDS AFTER MIDNIGHT  TAKE THESE MEDICINES THE MORNING OF SURGERY: ATENOLOL  STOP ASPIRIN / IBUPROFEN / ALEVE / VITAMINS / HERBAL MEDS __5__ DAYS BEFORE SURGERY    YOU MAY NOT HAVE ANY METAL ON YOUR BODY INCLUDING HAIR PINS AND PIERCING'S. DO NOT WEAR JEWELRY, MAKEUP, LOTIONS, POWDERS OR PERFUMES. DO NOT WEAR NAIL POLISH. DO NOT SHAVE 48 HRS PRIOR TO SURGERY. MEN MAY SHAVE FACE AND NECK.  DO NOT [facility] IS NOT RESPONSIBLE FOR VALUABLES.  CONTACTS, DENTURES OR PARTIALS MAY NOT BE WORN TO SURGERY. LEAVE SUITCASE IN CAR. CAN BE BROUGHT TO ROOM AFTER SURGERY.  PATIENTS DISCHARGED THE DAY OF SURGERY WILL NOT BE ALLOWED TO DRIVE HOME.  PLEASE READ OVER THE FOLLOWING INSTRUCTION SHEETS _________________________________________________________________________________                                          [facility] - PREPARING FOR SURGERY  Before surgery, you can play an important role.  Because skin is not sterile, your skin needs to be as free of germs as possible.  You can reduce the number of germs on your skin by washing with CHG (chlorahexidine gluconate) soap before surgery.  CHG is an antiseptic cleaner which kills germs and bonds with the skin to continue killing germs even after washing. Please DO NOT use if you have an allergy to CHG or antibacterial soaps.  If your skin becomes reddened/irritated stop using the CHG and inform your nurse when you arrive at Short Stay. Do not shave (including legs and underarms) for at least 48 hours prior to the  first CHG shower.  You may shave your face. Please follow these instructions carefully:   1.  Shower with CHG Soap the night before surgery and the  morning of Surgery.   2.  If you choose to wash your hair, wash your hair first as usual with your  normal  Shampoo.   3.  After you shampoo, rinse your hair and body thoroughly to remove the  shampoo.                                         4.  Use CHG as you would any other liquid soap.  You can apply chg directly  to the skin and wash . Gently wash with scrungie or clean wascloth    5.  Apply the CHG Soap to your body ONLY FROM THE NECK DOWN.   Do not use on open                           Wound or open sores. Avoid contact with eyes, ears mouth and genitals (private parts).  Genitals (private parts) with your normal soap.              6.  Wash thoroughly, paying special attention to the area where your surgery  will be performed.   7.  Thoroughly rinse your body with warm water from the neck down.   8.  DO NOT shower/wash with your normal soap after using and rinsing off  the CHG Soap .                9.  Pat yourself dry with a clean towel.             10.  Wear clean night clothes to bed after shower             11.  Place clean sheets on your bed the night of your first shower and do not  sleep with pets.  Day of Surgery : Do not apply any lotions/deodorants the morning of surgery.  Please wear clean clothes to the hospital/surgery center.  FAILURE TO FOLLOW THESE INSTRUCTIONS MAY RESULT IN THE CANCELLATION OF YOUR SURGERY    PATIENT SIGNATURE_________________________________  ______________________________________________________________________

## 2014-08-24 ENCOUNTER — Encounter (HOSPITAL_COMMUNITY)
Admission: RE | Admit: 2014-08-24 | Discharge: 2014-08-24 | Disposition: A | Discharge status: Home / Self Care | Payer: BC Managed Care – PPO | Source: Ambulatory Visit | Attending: Obstetrics and Gynecology | Admitting: Obstetrics and Gynecology

## 2014-08-24 ENCOUNTER — Encounter (HOSPITAL_COMMUNITY): Payer: Self-pay

## 2014-08-24 DIAGNOSIS — Z01812 Encounter for preprocedural laboratory examination: Secondary | ICD-10-CM | POA: Insufficient documentation

## 2014-08-24 DIAGNOSIS — I10 Essential (primary) hypertension: Secondary | ICD-10-CM | POA: Diagnosis not present

## 2014-08-24 DIAGNOSIS — Z01810 Encounter for preprocedural cardiovascular examination: Secondary | ICD-10-CM | POA: Insufficient documentation

## 2014-08-24 HISTORY — DX: Unspecified osteoarthritis, unspecified site: M19.90

## 2014-08-24 HISTORY — DX: Benign neoplasm of connective and other soft tissue, unspecified: D21.9

## 2014-08-24 HISTORY — DX: Excessive and frequent menstruation with regular cycle: N92.0

## 2014-08-24 HISTORY — DX: Anemia, unspecified: D64.9

## 2014-08-24 HISTORY — DX: Umbilical hernia without obstruction or gangrene: K42.9

## 2014-08-24 LAB — CBC
HCT: 37.4 % (ref 36.0–46.0)
Hemoglobin: 11.8 g/dL — ABNORMAL LOW (ref 12.0–15.0)
MCH: 26.5 pg (ref 26.0–34.0)
MCHC: 31.6 g/dL (ref 30.0–36.0)
MCV: 83.9 fL (ref 78.0–100.0)
Platelets: 458 10*3/uL — ABNORMAL HIGH (ref 150–400)
RBC: 4.46 MIL/uL (ref 3.87–5.11)
RDW: 26.1 % — ABNORMAL HIGH (ref 11.5–15.5)
WBC: 7.3 10*3/uL (ref 4.0–10.5)

## 2014-08-24 LAB — BASIC METABOLIC PANEL
Anion gap: 6 (ref 5–15)
BUN: 8 mg/dL (ref 6–20)
CO2: 26 mmol/L (ref 22–32)
Calcium: 8.8 mg/dL — ABNORMAL LOW (ref 8.9–10.3)
Chloride: 106 mmol/L (ref 101–111)
Creatinine, Ser: 0.71 mg/dL (ref 0.44–1.00)
GFR calc Af Amer: >60 mL/min (ref >60)
GFR calc non Af Amer: >60 mL/min (ref >60)
Glucose, Bld: 96 mg/dL (ref 65–99)
Potassium: 3.7 mmol/L (ref 3.5–5.1)
Sodium: 138 mmol/L (ref 135–145)

## 2014-08-24 LAB — ABO/RH: ABO/RH(D): O POS

## 2014-08-24 LAB — HCG, SERUM, QUALITATIVE: Preg, Serum: NEGATIVE

## 2014-08-29 LAB — TYPE AND SCREEN
ABO/RH(D): O POS
Antibody Screen: NEGATIVE

## 2014-08-30 ENCOUNTER — Encounter (HOSPITAL_COMMUNITY)
Admission: AD | Disposition: A | Discharge status: Home / Self Care | Payer: Self-pay | Source: Ambulatory Visit | Attending: Obstetrics and Gynecology

## 2014-08-30 ENCOUNTER — Ambulatory Visit (HOSPITAL_COMMUNITY): Payer: BC Managed Care – PPO | Admitting: Anesthesiology

## 2014-08-30 ENCOUNTER — Encounter (HOSPITAL_COMMUNITY): Payer: Self-pay | Admitting: *Deleted

## 2014-08-30 ENCOUNTER — Inpatient Hospital Stay (HOSPITAL_COMMUNITY)
Admission: AD | Admit: 2014-08-30 | Discharge: 2014-09-01 | DRG: 743 | Disposition: A | Discharge status: Home / Self Care | Payer: BC Managed Care – PPO | Source: Ambulatory Visit | Attending: Obstetrics and Gynecology | Admitting: Obstetrics and Gynecology

## 2014-08-30 DIAGNOSIS — Z833 Family history of diabetes mellitus: Secondary | ICD-10-CM | POA: Diagnosis not present

## 2014-08-30 DIAGNOSIS — D509 Iron deficiency anemia, unspecified: Secondary | ICD-10-CM | POA: Diagnosis present

## 2014-08-30 DIAGNOSIS — Z90710 Acquired absence of both cervix and uterus: Secondary | ICD-10-CM

## 2014-08-30 DIAGNOSIS — Z8249 Family history of ischemic heart disease and other diseases of the circulatory system: Secondary | ICD-10-CM

## 2014-08-30 DIAGNOSIS — D259 Leiomyoma of uterus, unspecified: Secondary | ICD-10-CM | POA: Diagnosis present

## 2014-08-30 DIAGNOSIS — Z803 Family history of malignant neoplasm of breast: Secondary | ICD-10-CM

## 2014-08-30 DIAGNOSIS — I10 Essential (primary) hypertension: Secondary | ICD-10-CM | POA: Diagnosis present

## 2014-08-30 DIAGNOSIS — N920 Excessive and frequent menstruation with regular cycle: Secondary | ICD-10-CM | POA: Diagnosis present

## 2014-08-30 DIAGNOSIS — N946 Dysmenorrhea, unspecified: Secondary | ICD-10-CM | POA: Diagnosis present

## 2014-08-30 DIAGNOSIS — K429 Umbilical hernia without obstruction or gangrene: Secondary | ICD-10-CM | POA: Diagnosis present

## 2014-08-30 DIAGNOSIS — D500 Iron deficiency anemia secondary to blood loss (chronic): Secondary | ICD-10-CM

## 2014-08-30 DIAGNOSIS — Z01812 Encounter for preprocedural laboratory examination: Secondary | ICD-10-CM | POA: Diagnosis not present

## 2014-08-30 HISTORY — PX: ROBOTIC ASSISTED TOTAL HYSTERECTOMY: SHX6085

## 2014-08-30 HISTORY — PX: UMBILICAL HERNIA REPAIR: SHX196

## 2014-08-30 SURGERY — ROBOTIC ASSISTED TOTAL HYSTERECTOMY
Anesthesia: General

## 2014-08-30 MED ORDER — ONDANSETRON HCL 4 MG/2ML IJ SOLN: 4.0000 mg | Freq: Four times a day (QID) | INTRAMUSCULAR | Status: DC | PRN

## 2014-08-30 MED ORDER — PANTOPRAZOLE SODIUM 40 MG PO TBEC
40.0000 mg | DELAYED_RELEASE_TABLET | Freq: Every day | ORAL | Status: DC
  Administered 2014-08-30 – 2014-09-01 (×3): 40 mg via ORAL
  Filled 2014-08-30 (×4): qty 1

## 2014-08-30 MED ORDER — DEXAMETHASONE SODIUM PHOSPHATE 10 MG/ML IJ SOLN
INTRAMUSCULAR | Status: AC
  Filled 2014-08-30: qty 1

## 2014-08-30 MED ORDER — PROMETHAZINE HCL 25 MG/ML IJ SOLN: 6.2500 mg | INTRAMUSCULAR | Status: DC | PRN

## 2014-08-30 MED ORDER — LACTATED RINGERS IV SOLN: INTRAVENOUS | Status: DC

## 2014-08-30 MED ORDER — MIDAZOLAM HCL 5 MG/5ML IJ SOLN
INTRAMUSCULAR | Status: DC | PRN
  Administered 2014-08-30 (×2): 1 mg via INTRAVENOUS

## 2014-08-30 MED ORDER — KETOROLAC TROMETHAMINE 30 MG/ML IJ SOLN
INTRAMUSCULAR | Status: DC | PRN
  Administered 2014-08-30: 30 mg via INTRAVENOUS

## 2014-08-30 MED ORDER — ROCURONIUM BROMIDE 100 MG/10ML IV SOLN
INTRAVENOUS | Status: AC
  Filled 2014-08-30: qty 1

## 2014-08-30 MED ORDER — GLYCOPYRROLATE 0.2 MG/ML IJ SOLN
INTRAMUSCULAR | Status: DC | PRN
  Administered 2014-08-30: 0.6 mg via INTRAVENOUS

## 2014-08-30 MED ORDER — LACTATED RINGERS IV SOLN
INTRAVENOUS | Status: DC | PRN
  Administered 2014-08-30 (×2): via INTRAVENOUS

## 2014-08-30 MED ORDER — MEPERIDINE HCL 50 MG/ML IJ SOLN: 6.2500 mg | INTRAMUSCULAR | Status: DC | PRN

## 2014-08-30 MED ORDER — SODIUM CHLORIDE 0.9 % IJ SOLN: 9.0000 mL | INTRAMUSCULAR | Status: DC | PRN

## 2014-08-30 MED ORDER — PROPOFOL 10 MG/ML IV BOLUS
INTRAVENOUS | Status: DC | PRN
  Administered 2014-08-30: 200 mg via INTRAVENOUS

## 2014-08-30 MED ORDER — BUPIVACAINE-EPINEPHRINE (PF) 0.25% -1:200000 IJ SOLN
INTRAMUSCULAR | Status: AC
  Filled 2014-08-30: qty 30

## 2014-08-30 MED ORDER — ONDANSETRON HCL 4 MG/2ML IJ SOLN
INTRAMUSCULAR | Status: AC
  Filled 2014-08-30: qty 2

## 2014-08-30 MED ORDER — FENTANYL CITRATE (PF) 250 MCG/5ML IJ SOLN
INTRAMUSCULAR | Status: DC | PRN
  Administered 2014-08-30 (×5): 50 ug via INTRAVENOUS
  Administered 2014-08-30: 100 ug via INTRAVENOUS

## 2014-08-30 MED ORDER — HYDROMORPHONE HCL 1 MG/ML IJ SOLN
INTRAMUSCULAR | Status: DC | PRN
  Administered 2014-08-30: .2 mg via INTRAVENOUS
  Administered 2014-08-30 (×4): .4 mg via INTRAVENOUS
  Administered 2014-08-30: .2 mg via INTRAVENOUS

## 2014-08-30 MED ORDER — GLYCOPYRROLATE 0.2 MG/ML IJ SOLN
INTRAMUSCULAR | Status: AC
  Filled 2014-08-30: qty 3

## 2014-08-30 MED ORDER — FENTANYL CITRATE (PF) 100 MCG/2ML IJ SOLN
INTRAMUSCULAR | Status: AC
  Filled 2014-08-30: qty 4

## 2014-08-30 MED ORDER — HYDROMORPHONE HCL 1 MG/ML IJ SOLN
0.2000 mg | INTRAMUSCULAR | Status: DC | PRN
  Administered 2014-08-30: 0.6 mg via INTRAVENOUS
  Filled 2014-08-30: qty 1

## 2014-08-30 MED ORDER — FENTANYL CITRATE (PF) 100 MCG/2ML IJ SOLN
25.0000 ug | INTRAMUSCULAR | Status: DC | PRN
  Administered 2014-08-30 (×3): 50 ug via INTRAVENOUS

## 2014-08-30 MED ORDER — ONDANSETRON HCL 4 MG/2ML IJ SOLN
INTRAMUSCULAR | Status: DC | PRN
  Administered 2014-08-30: 4 mg via INTRAVENOUS

## 2014-08-30 MED ORDER — KETOROLAC TROMETHAMINE 30 MG/ML IJ SOLN
30.0000 mg | Freq: Four times a day (QID) | INTRAMUSCULAR | Status: DC
  Filled 2014-08-30 (×4): qty 1

## 2014-08-30 MED ORDER — DEXAMETHASONE SODIUM PHOSPHATE 10 MG/ML IJ SOLN
INTRAMUSCULAR | Status: DC | PRN
  Administered 2014-08-30: 10 mg via INTRAVENOUS

## 2014-08-30 MED ORDER — STERILE WATER FOR INJECTION IJ SOLN
INTRAMUSCULAR | Status: AC
  Filled 2014-08-30: qty 10

## 2014-08-30 MED ORDER — IBUPROFEN 800 MG PO TABS: 800.0000 mg | ORAL_TABLET | Freq: Three times a day (TID) | ORAL | Status: DC | PRN

## 2014-08-30 MED ORDER — DIPHENHYDRAMINE HCL 12.5 MG/5ML PO ELIX
12.5000 mg | ORAL_SOLUTION | Freq: Four times a day (QID) | ORAL | Status: DC | PRN
  Filled 2014-08-30: qty 5

## 2014-08-30 MED ORDER — LABETALOL HCL 5 MG/ML IV SOLN
INTRAVENOUS | Status: DC | PRN
  Administered 2014-08-30 (×4): 5 mg via INTRAVENOUS

## 2014-08-30 MED ORDER — SUCCINYLCHOLINE CHLORIDE 20 MG/ML IJ SOLN
INTRAMUSCULAR | Status: DC | PRN
  Administered 2014-08-30: 100 mg via INTRAVENOUS

## 2014-08-30 MED ORDER — PROPOFOL 10 MG/ML IV BOLUS
INTRAVENOUS | Status: AC
  Filled 2014-08-30: qty 20

## 2014-08-30 MED ORDER — KETOROLAC TROMETHAMINE 30 MG/ML IJ SOLN
30.0000 mg | Freq: Four times a day (QID) | INTRAMUSCULAR | Status: DC
  Administered 2014-08-30 – 2014-08-31 (×4): 30 mg via INTRAVENOUS
  Filled 2014-08-30 (×7): qty 1

## 2014-08-30 MED ORDER — DIPHENHYDRAMINE HCL 50 MG/ML IJ SOLN: 12.5000 mg | Freq: Four times a day (QID) | INTRAMUSCULAR | Status: DC | PRN

## 2014-08-30 MED ORDER — FENTANYL CITRATE (PF) 250 MCG/5ML IJ SOLN
INTRAMUSCULAR | Status: AC
  Filled 2014-08-30: qty 25

## 2014-08-30 MED ORDER — DIPHENHYDRAMINE HCL 12.5 MG/5ML PO ELIX: 12.5000 mg | ORAL_SOLUTION | Freq: Four times a day (QID) | ORAL | Status: DC | PRN

## 2014-08-30 MED ORDER — ROCURONIUM BROMIDE 100 MG/10ML IV SOLN
INTRAVENOUS | Status: DC | PRN
  Administered 2014-08-30: 10 mg via INTRAVENOUS
  Administered 2014-08-30 (×2): 20 mg via INTRAVENOUS
  Administered 2014-08-30: 30 mg via INTRAVENOUS
  Administered 2014-08-30: 20 mg via INTRAVENOUS
  Administered 2014-08-30 (×2): 10 mg via INTRAVENOUS

## 2014-08-30 MED ORDER — NALOXONE HCL 0.4 MG/ML IJ SOLN: 0.4000 mg | INTRAMUSCULAR | Status: DC | PRN

## 2014-08-30 MED ORDER — FENTANYL CITRATE (PF) 100 MCG/2ML IJ SOLN
INTRAMUSCULAR | Status: AC
  Filled 2014-08-30: qty 2

## 2014-08-30 MED ORDER — CEFAZOLIN SODIUM-DEXTROSE 2-3 GM-% IV SOLR: 2.0000 g | INTRAVENOUS | Status: DC

## 2014-08-30 MED ORDER — NEOSTIGMINE METHYLSULFATE 10 MG/10ML IV SOLN
INTRAVENOUS | Status: AC
  Filled 2014-08-30: qty 1

## 2014-08-30 MED ORDER — HYDROMORPHONE 0.3 MG/ML IV SOLN
INTRAVENOUS | Status: DC
  Administered 2014-08-30: 0.3 mg via INTRAVENOUS
  Administered 2014-08-31: 0.2 mg via INTRAVENOUS
  Administered 2014-08-31: 0 mg via INTRAVENOUS
  Administered 2014-08-31: 0.799 mg via INTRAVENOUS
  Filled 2014-08-30: qty 25

## 2014-08-30 MED ORDER — MIDAZOLAM HCL 2 MG/2ML IJ SOLN
INTRAMUSCULAR | Status: AC
  Filled 2014-08-30: qty 4

## 2014-08-30 MED ORDER — SIMETHICONE 80 MG PO CHEW
80.0000 mg | CHEWABLE_TABLET | Freq: Four times a day (QID) | ORAL | Status: DC | PRN
  Filled 2014-08-30: qty 1

## 2014-08-30 MED ORDER — ONDANSETRON HCL 4 MG/2ML IJ SOLN
4.0000 mg | Freq: Four times a day (QID) | INTRAMUSCULAR | Status: DC | PRN
  Administered 2014-08-30 – 2014-08-31 (×2): 4 mg via INTRAVENOUS
  Filled 2014-08-30 (×2): qty 2

## 2014-08-30 MED ORDER — CEFAZOLIN SODIUM-DEXTROSE 2-3 GM-% IV SOLR
INTRAVENOUS | Status: AC
  Administered 2014-08-30 (×2): 2 g via INTRAVENOUS
  Filled 2014-08-30: qty 50

## 2014-08-30 MED ORDER — ACETAMINOPHEN 500 MG PO TABS: 1000.0000 mg | ORAL_TABLET | Freq: Four times a day (QID) | ORAL | Status: DC | PRN

## 2014-08-30 MED ORDER — STERILE WATER FOR IRRIGATION IR SOLN
Status: DC | PRN
  Administered 2014-08-30: 1000 mL

## 2014-08-30 MED ORDER — BUPIVACAINE-EPINEPHRINE 0.25% -1:200000 IJ SOLN
INTRAMUSCULAR | Status: DC | PRN
  Administered 2014-08-30: 14 mL

## 2014-08-30 MED ORDER — 0.9 % SODIUM CHLORIDE (POUR BTL) OPTIME
TOPICAL | Status: DC | PRN
  Administered 2014-08-30: 2000 mL

## 2014-08-30 MED ORDER — HYDROMORPHONE HCL 2 MG/ML IJ SOLN
INTRAMUSCULAR | Status: AC
  Filled 2014-08-30: qty 1

## 2014-08-30 MED ORDER — ATENOLOL 25 MG PO TABS
25.0000 mg | ORAL_TABLET | Freq: Two times a day (BID) | ORAL | Status: DC
  Administered 2014-08-30 – 2014-09-01 (×4): 25 mg via ORAL
  Filled 2014-08-30 (×6): qty 1

## 2014-08-30 MED ORDER — CHLORHEXIDINE GLUCONATE 4 % EX LIQD: 1.0000 "application " | Freq: Once | CUTANEOUS | Status: DC

## 2014-08-30 MED ORDER — ONDANSETRON HCL 4 MG PO TABS: 4.0000 mg | ORAL_TABLET | Freq: Four times a day (QID) | ORAL | Status: DC | PRN

## 2014-08-30 MED ORDER — POTASSIUM CHLORIDE ER 10 MEQ PO TBCR
10.0000 meq | EXTENDED_RELEASE_TABLET | Freq: Every day | ORAL | Status: DC
  Administered 2014-08-31 – 2014-09-01 (×2): 10 meq via ORAL
  Filled 2014-08-30 (×2): qty 1

## 2014-08-30 MED ORDER — NEOSTIGMINE METHYLSULFATE 10 MG/10ML IV SOLN
INTRAVENOUS | Status: DC | PRN
  Administered 2014-08-30: 4 mg via INTRAVENOUS

## 2014-08-30 MED ORDER — CEFAZOLIN SODIUM-DEXTROSE 2-3 GM-% IV SOLR
INTRAVENOUS | Status: AC
  Filled 2014-08-30: qty 50

## 2014-08-30 MED ORDER — LABETALOL HCL 5 MG/ML IV SOLN
INTRAVENOUS | Status: AC
  Filled 2014-08-30: qty 4

## 2014-08-30 MED ORDER — OXYCODONE-ACETAMINOPHEN 5-325 MG PO TABS
1.0000 | ORAL_TABLET | ORAL | Status: DC | PRN
  Administered 2014-08-31 – 2014-09-01 (×3): 2 via ORAL
  Filled 2014-08-30 (×3): qty 2

## 2014-08-30 MED ORDER — MENTHOL 3 MG MT LOZG
1.0000 | LOZENGE | OROMUCOSAL | Status: DC | PRN
  Filled 2014-08-30: qty 9

## 2014-08-30 MED ORDER — DEXTROSE IN LACTATED RINGERS 5 % IV SOLN
INTRAVENOUS | Status: DC
  Administered 2014-08-30: 15:00:00 via INTRAVENOUS
  Administered 2014-08-30: 1000 mL via INTRAVENOUS

## 2014-08-30 MED ORDER — FUROSEMIDE 20 MG PO TABS
20.0000 mg | ORAL_TABLET | Freq: Every day | ORAL | Status: DC
  Administered 2014-08-31 – 2014-09-01 (×2): 20 mg via ORAL
  Filled 2014-08-30 (×2): qty 1

## 2014-08-30 MED ORDER — LISINOPRIL 40 MG PO TABS
40.0000 mg | ORAL_TABLET | Freq: Every day | ORAL | Status: DC
  Administered 2014-08-30 – 2014-09-01 (×3): 40 mg via ORAL
  Filled 2014-08-30 (×3): qty 1

## 2014-08-30 MED ORDER — KETOROLAC TROMETHAMINE 30 MG/ML IJ SOLN
INTRAMUSCULAR | Status: AC
  Filled 2014-08-30: qty 1

## 2014-08-30 SURGICAL SUPPLY — 86 items
BARRIER ADHS 3X4 INTERCEED (GAUZE/BANDAGES/DRESSINGS) IMPLANT
BENZOIN TINCTURE PRP APPL 2/3 (GAUZE/BANDAGES/DRESSINGS) IMPLANT
BLADE EXTENDED COATED 6.5IN (ELECTRODE) ×3 IMPLANT
BLADE HEX COATED 2.75 (ELECTRODE) IMPLANT
BLADE SURG 15 STRL LF DISP TIS (BLADE) IMPLANT
BLADE SURG 15 STRL SS (BLADE)
BLADE SURG SZ10 CARB STEEL (BLADE) IMPLANT
CATH FOLEY 3WAY  5CC 16FR (CATHETERS) ×1
CATH FOLEY 3WAY 5CC 16FR (CATHETERS) ×2 IMPLANT
CHLORAPREP W/TINT 26ML (MISCELLANEOUS) ×3 IMPLANT
COVER SURGICAL LIGHT HANDLE (MISCELLANEOUS) ×9 IMPLANT
COVER TIP SHEARS 8 DVNC (MISCELLANEOUS) ×2 IMPLANT
COVER TIP SHEARS 8MM DA VINCI (MISCELLANEOUS) ×1
DECANTER SPIKE VIAL GLASS SM (MISCELLANEOUS) IMPLANT
DRAPE LAPAROSCOPIC ABDOMINAL (DRAPES) IMPLANT
DRAPE SHEET LG 3/4 BI-LAMINATE (DRAPES) ×9 IMPLANT
DRAPE SURG IRRIG POUCH 19X23 (DRAPES) ×3 IMPLANT
DRAPE TABLE BACK 44X90 PK DISP (DRAPES) IMPLANT
DRAPE WARM FLUID 44X44 (DRAPE) ×3 IMPLANT
DRSG TEGADERM 4X4.75 (GAUZE/BANDAGES/DRESSINGS) ×3 IMPLANT
DRSG TEGADERM 6X8 (GAUZE/BANDAGES/DRESSINGS) IMPLANT
DRSG VASELINE 3X18 (GAUZE/BANDAGES/DRESSINGS) ×3 IMPLANT
ELECT PENCIL ROCKER SW 15FT (MISCELLANEOUS) IMPLANT
ELECT REM PT RETURN 9FT ADLT (ELECTROSURGICAL) ×3
ELECTRODE REM PT RTRN 9FT ADLT (ELECTROSURGICAL) ×2 IMPLANT
FILTER SMOKE EVAC LAPAROSHD (FILTER) ×3 IMPLANT
GAUZE SPONGE 2X2 8PLY STRL LF (GAUZE/BANDAGES/DRESSINGS) ×2 IMPLANT
GAUZE SPONGE 4X4 12PLY STRL (GAUZE/BANDAGES/DRESSINGS) ×3 IMPLANT
GLOVE BIO SURGEON STRL SZ7.5 (GLOVE) ×6 IMPLANT
GLOVE BIOGEL PI IND STRL 7.0 (GLOVE) ×10 IMPLANT
GLOVE BIOGEL PI INDICATOR 7.0 (GLOVE) ×5
GLOVE ECLIPSE 6.5 STRL STRAW (GLOVE) ×18 IMPLANT
GOWN STRL REUS W/ TWL XL LVL3 (GOWN DISPOSABLE) ×4 IMPLANT
GOWN STRL REUS W/TWL LRG LVL3 (GOWN DISPOSABLE) ×24 IMPLANT
GOWN STRL REUS W/TWL XL LVL3 (GOWN DISPOSABLE) ×5 IMPLANT
KIT ACCESSORY DA VINCI DISP (KITS) ×1
KIT ACCESSORY DVNC DISP (KITS) ×2 IMPLANT
KIT BASIN OR (CUSTOM PROCEDURE TRAY) ×3 IMPLANT
LIQUID BAND (GAUZE/BANDAGES/DRESSINGS) ×3 IMPLANT
MANIPULATOR UTERINE 4.5 ZUMI (MISCELLANEOUS) ×3 IMPLANT
MANIPULATOR VCARE LG CRV RETR (MISCELLANEOUS) ×3 IMPLANT
NEEDLE HYPO 22GX1.5 SAFETY (NEEDLE) IMPLANT
NEEDLE HYPO 25X1 1.5 SAFETY (NEEDLE) IMPLANT
NEEDLE INSUFFLATION 14GA 120MM (NEEDLE) ×3 IMPLANT
NS IRRIG 1000ML POUR BTL (IV SOLUTION) ×3 IMPLANT
OCCLUDER COLPOPNEUMO (BALLOONS) ×3 IMPLANT
PACK BASIC VI WITH GOWN DISP (CUSTOM PROCEDURE TRAY) ×3 IMPLANT
PORT ACCESS TROCAR AIRSEAL 12 (TROCAR) ×2 IMPLANT
PORT ACCESS TROCAR AIRSEAL 5M (TROCAR) ×1
SET TRI-LUMEN FLTR TB AIRSEAL (TUBING) ×3 IMPLANT
SET TUBE IRRIG SUCTION NO TIP (IRRIGATION / IRRIGATOR) ×3 IMPLANT
SHEET LAVH (DRAPES) ×6 IMPLANT
SOL PREP POV-IOD 4OZ 10% (MISCELLANEOUS) ×3 IMPLANT
SOLUTION ELECTROLUBE (MISCELLANEOUS) ×3 IMPLANT
SPONGE GAUZE 2X2 STER 10/PKG (GAUZE/BANDAGES/DRESSINGS) ×1
SPONGE LAP 18X18 X RAY DECT (DISPOSABLE) IMPLANT
STRIP CLOSURE SKIN 1/2X4 (GAUZE/BANDAGES/DRESSINGS) ×6 IMPLANT
SUT MNCRL AB 4-0 PS2 18 (SUTURE) IMPLANT
SUT NOVA NAB DX-16 0-1 5-0 T12 (SUTURE) IMPLANT
SUT PLAIN 2 0 XLH (SUTURE) ×3 IMPLANT
SUT PROLENE 0 CT 1 30 (SUTURE) IMPLANT
SUT PROLENE 0 CT 1 CR/8 (SUTURE) IMPLANT
SUT VIC AB 0 BRD 54 (SUTURE) ×3 IMPLANT
SUT VIC AB 0 CT1 27 (SUTURE) ×8
SUT VIC AB 0 CT1 27XBRD ANTBC (SUTURE) ×16 IMPLANT
SUT VIC AB 0 CT1 36 (SUTURE) ×3 IMPLANT
SUT VIC AB 2-0 CT1 36 (SUTURE) ×6 IMPLANT
SUT VIC AB 2-0 SH 27 (SUTURE)
SUT VIC AB 2-0 SH 27X BRD (SUTURE) IMPLANT
SUT VIC AB 4-0 PS2 27 (SUTURE) ×3 IMPLANT
SUT VICRYL 0 UR6 27IN ABS (SUTURE) ×6 IMPLANT
SYR 20CC LL (SYRINGE) ×6 IMPLANT
SYR 50ML LL SCALE MARK (SYRINGE) ×3 IMPLANT
SYR BULB IRRIGATION 50ML (SYRINGE) IMPLANT
SYR CONTROL 10ML LL (SYRINGE) IMPLANT
TIP UTERINE 6.7X10CM GRN DISP (MISCELLANEOUS) ×3 IMPLANT
TOWEL OR 17X26 10 PK STRL BLUE (TOWEL DISPOSABLE) ×6 IMPLANT
TRAY FOLEY W/METER SILVER 14FR (SET/KITS/TRAYS/PACK) ×3 IMPLANT
TROCAR 12M 150ML BLUNT (TROCAR) ×3 IMPLANT
TROCAR DISP BLADELESS 8 DVNC (TROCAR) IMPLANT
TROCAR DISP BLADELESS 8MM (TROCAR)
TROCAR KII 12MM C0R66 BLD (TROCAR) IMPLANT
TROCAR XCEL 12X100 BLDLESS (ENDOMECHANICALS) ×3 IMPLANT
TUBING FILTER THERMOFLATOR (ELECTROSURGICAL) IMPLANT
WATER STERILE IRR 1500ML POUR (IV SOLUTION) ×9 IMPLANT
YANKAUER SUCT BULB TIP 10FT TU (MISCELLANEOUS) IMPLANT

## 2014-08-30 NOTE — H&P (Signed)
[name]. [name] 07/25/2014 11:07 AM Location: [facility] Patient #: [id number] DOB: 12/11/1970 Divorced / Language: [name] / Race: Black or African American Female  History of Present Illness Sammuel Chandler. Gail Starks MD; 07/31/2014 8:56 AM) Patient words: hernia.  The patient is a 44 year old female who presents with abdominal swelling. We're asked to see the patient in consultation by Dr. Coralyn Mark to evaluate her for an umbilical hernia. The patient is a 44 year old black female who we had seen previously for an umbilical hernia. The hernia has been reducible. It has not changed very much in size since the last time we saw her. She denies any nausea or vomiting. She does have some discomfort associated with it. She would like to have it fixed at the same time that she has a laparoscopic-assisted partial hysterectomy.   Other Problems [name], CMA; 07/25/2014 11:07 AM) Arthritis High blood pressure  Diagnostic Studies History [name], CMA; 07/25/2014 11:07 AM) Colonoscopy never Mammogram within last year  Allergies ([name], CMA; 07/25/2014 11:10 AM) No Known Drug Allergies06/29/2016  Medication History ([name], CMA; 07/25/2014 11:11 AM) Atenolol (25MG  Tablet, Oral) Active. Lasix (20MG  Tablet, Oral) Active. Lisinopril (40MG  Tablet, Oral) Active. Ativan (1MG  Tablet, Oral) Active. Potassium Chloride (20MEQ Packet, Oral) Active. Medications Reconciled  Social History [name], CMA; 07/25/2014 11:07 AM) Alcohol use Occasional alcohol use. Caffeine use Carbonated beverages, Tea. No drug use Tobacco use Never smoker.  Family History [name], [place]; 07/25/2014 11:07 AM) Arthritis Father, Mother. Breast Cancer Mother. Diabetes Mellitus Mother. Hypertension Brother, Father, Mother, Sister. Thyroid problems Mother.  Pregnancy / Birth History [name], [place]; 07/25/2014 11:07 AM) Age at menarche 16 years. Contraceptive History Oral  contraceptives. Gravida 2 Maternal age 38-30 Para 2 Regular periods  Review of Systems ([place]; 07/25/2014 11:07 AM) General Present- Fatigue and Night Sweats. Not Present- Appetite Loss, Chills, Fever, Weight Gain and Weight Loss. Skin Not Present- Change in Wart/Mole, Dryness, Hives, Jaundice, New Lesions, Non-Healing Wounds, Rash and Ulcer. HEENT Not Present- Earache, Hearing Loss, Hoarseness, Nose Bleed, Oral Ulcers, Ringing in the Ears, Seasonal Allergies, Sinus Pain, Sore Throat, Visual Disturbances, Wears glasses/contact lenses and Yellow Eyes. Respiratory Not Present- Bloody sputum, Chronic Cough, Difficulty Breathing, Snoring and Wheezing. Breast Not Present- Breast Mass, Breast Pain, Nipple Discharge and Skin Changes. Cardiovascular Not Present- Chest Pain, Difficulty Breathing Lying Down, Leg Cramps, Palpitations, Rapid Heart Rate, Shortness of Breath and Swelling of Extremities. Gastrointestinal Present- Abdominal Pain. Not Present- Bloating, Bloody Stool, Change in Bowel Habits, Chronic diarrhea, Constipation, Difficulty Swallowing, Excessive gas, Gets full quickly at meals, Hemorrhoids, Indigestion, Nausea, Rectal Pain and Vomiting. Musculoskeletal Present- Joint Pain and Joint Stiffness. Not Present- Back Pain, Muscle Pain, Muscle Weakness and Swelling of Extremities. Neurological Not Present- Decreased Memory, Fainting, Headaches, Numbness, Seizures, Tingling, Tremor, Trouble walking and Weakness. Endocrine Not Present- Cold Intolerance, Excessive Hunger, Hair Changes, Heat Intolerance, Hot flashes and New Diabetes. Hematology Not Present- Easy Bruising, Excessive bleeding, Gland problems, HIV and Persistent Infections.   Vitals ([name] CMA; 07/25/2014 11:09 AM) 07/25/2014 11:09 AM Weight: 221.4 lb Height: 66in Body Surface Area: 2.16 m Body Mass Index: 35.73 kg/m Temp.: 91F(Temporal)  Pulse: 81 (Regular)  BP: 142/84 (Sitting, Left Arm,  Standard)    Physical Exam [name] S. Gail Starks MD; 07/31/2014 8:57 AM) General Mental Status-Alert. General Appearance-Consistent with stated age. Hydration-Well hydrated. Voice-Normal.  Head and Neck Head-normocephalic, atraumatic with no lesions or palpable masses. Trachea-midline. Thyroid Gland Characteristics - normal size and consistency.  Eye Eyeball - Bilateral-Extraocular movements intact. Sclera/Conjunctiva - Bilateral-No scleral icterus.  Chest and Lung Exam Chest and lung exam reveals -quiet, even and easy respiratory effort with no use of accessory muscles and on auscultation, normal breath sounds, no adventitious sounds and normal vocal resonance. Inspection Chest Wall - Normal. Back - normal.  Cardiovascular Cardiovascular examination reveals -normal heart sounds, regular rate and rhythm with no murmurs and normal pedal pulses bilaterally.  Abdomen Note: The abdomen is soft and nontender. There is a small to moderate-sized reducible umbilical hernia.   Neurologic Neurologic evaluation reveals -alert and oriented x 3 with no impairment of recent or remote memory. Mental Status-Normal.  Musculoskeletal Normal Exam - Left-Upper Extremity Strength Normal and Lower Extremity Strength Normal. Normal Exam - Right-Upper Extremity Strength Normal and Lower Extremity Strength Normal.  Lymphatic Head & Neck  General Head & Neck Lymphatics: Bilateral - Description - Normal. Axillary  General Axillary Region: Bilateral - Description - Normal. Tenderness - Non Tender. Femoral & Inguinal  Generalized Femoral & Inguinal Lymphatics: Bilateral - Description - Normal. Tenderness - Non Tender.    Assessment & Plan [name] S. Gail Starks MD; 7/62/8315 17:61 AM) UMBILICAL HERNIA (607.3  X10.6) UMBILICAL HERNIA WITHOUT OBSTRUCTION AND WITHOUT GANGRENE (553.1  K42.9) Impression: The patient appears to have a small to moderate-sized umbilical hernia that is  reducible. She would like to have this repaired at the same time as a partial hysterectomy that she is planning for August. I think these can be done at the same time. I have discussed with her in detail the risks and benefits of the operation to fix the hernia as well as some of the technical aspects and she understands and wishes to proceed. I will coordinate with Dr. cousins for this     Signed by Luella Cook, MD (07/31/2014 8:57 AM)

## 2014-08-30 NOTE — Op Note (Signed)
08/30/2014  1:20 PM  PATIENT:  [name]  44 y.o. female  PRE-OPERATIVE DIAGNOSIS: umbilical hernia  POST-OPERATIVE DIAGNOSIS: Umbilical hernia  PROCEDURE:  Procedure(s): HERNIA REPAIR UMBILICAL ADULT (N/A)  SURGEON:  Surgeon(s) and Role:  Jovita Kussmaul, MD - Primary  PHYSICIAN ASSISTANT:   ASSISTANTS: none   ANESTHESIA:   general  EBL:  Total I/O In: 1000 [I.V.:1000] Out: 750 [Urine:600; Blood:150]  BLOOD ADMINISTERED:none  DRAINS: none   LOCAL MEDICATIONS USED:  MARCAINE     SPECIMEN:  No Specimen  DISPOSITION OF SPECIMEN:  N/A  COUNTS:  YES  TOURNIQUET:  * No tourniquets in log *  DICTATION: .Dragon Dictation  After informed consent was obtained the patient was brought to the operating room and placed in the supine position on the operating room table. After adequate induction of general anesthesia the patient was placed in lithotomy position in preparation for a robotic hysterectomy. The abdomen was prepped with ChloraPrep, allowed to dry, and draped in usual sterile manner. There was a small umbilical hernia. We decided to place a 12 mm port through the hernia for the robotic procedure. A small vertically oriented incision was made overlying the umbilicus with a 15 blade knife. The incision was carried through the skin and subcutaneous tissue sharply with the electrocautery until the hernia sac was entered. There were no visceral contents within the hernia sac. The hernia sac was excised sharply with the electrocautery. A figure of 8-0 Vicryl stitch was placed in the fascia surrounding the umbilical defect. A 12 mm port was placed bluntly through this fascial defect and anchored in place with the Vicryl stitch. The operation was then turned over to Dr. cousins. When she decided to abort the robotic portion of the procedure then I removed the port. The fascial defect was closed with the previously placed figure-of-eight 0 Vicryl stitch. The patient was in stable  condition. The rest of the operation will be dictated separately. For my portion of the case all needle sponge and instrument counts were correct.  PLAN OF CARE: Admit for overnight observation  PATIENT DISPOSITION:  PACU - hemodynamically stable.   Delay start of Pharmacological VTE agent (>24hrs) due to surgical blood loss or risk of bleeding: per GYN

## 2014-08-30 NOTE — Anesthesia Preprocedure Evaluation (Addendum)
Anesthesia Evaluation  Patient identified by MRN, date of birth, ID band Patient awake    Reviewed: Allergy & Precautions, NPO status , Patient's Chart, lab work & pertinent test results  Airway Mallampati: II  TM Distance: >3 FB Neck ROM: Full    Dental no notable dental hx.    Pulmonary neg pulmonary ROS,  breath sounds clear to auscultation  Pulmonary exam normal       Cardiovascular hypertension, Pt. on medications and Pt. on home beta blockers Normal cardiovascular examRhythm:Regular Rate:Normal     Neuro/Psych negative neurological ROS  negative psych ROS   GI/Hepatic negative GI ROS, Neg liver ROS,   Endo/Other  negative endocrine ROS  Renal/GU negative Renal ROS  negative genitourinary   Musculoskeletal negative musculoskeletal ROS (+)   Abdominal   Peds negative pediatric ROS (+)  Hematology negative hematology ROS (+)   Anesthesia Other Findings   Reproductive/Obstetrics negative OB ROS                            Anesthesia Physical Anesthesia Plan  ASA: II  Anesthesia Plan: General   Post-op Pain Management:    Induction: Intravenous  Airway Management Planned: Oral ETT  Additional Equipment:   Intra-op Plan:   Post-operative Plan: Extubation in OR  Informed Consent: I have reviewed the patients History and Physical, chart, labs and discussed the procedure including the risks, benefits and alternatives for the proposed anesthesia with the patient or authorized representative who has indicated his/her understanding and acceptance.   Dental advisory given  Plan Discussed with: CRNA  Anesthesia Plan Comments:         Anesthesia Quick Evaluation

## 2014-08-30 NOTE — Brief Op Note (Signed)
08/30/2014  2:41 PM  PATIENT:  [name]  44 y.o. female  PRE-OPERATIVE DIAGNOSIS:  Anemia, Fibroid Uterus, Menorrhagia, History of Ablation  POST-OPERATIVE DIAGNOSIS:  Anemia, Fibroid Uterus, Menorrhagia, History of Ablation  PROCEDURE:  Procedure(s): ROBOTIC ASSISTED ATTEMPTED, THEN CONVERSION TO OPEN TOTAL ABDOMINAL HYSTERECTOMY WITH BILATERAL SALPINGECTOMY (N/A) HERNIA REPAIR UMBILICAL ADULT (N/A)  SURGEON:  Surgeon(s) and Role: Panel 1:    * Servando Salina, MD - Primary  Panel 2:    * Jovita Kussmaul, MD - Primary  PHYSICIAN ASSISTANT:   ASSISTANTS: tanya bailey, cnm   ANESTHESIA:   general  EBL:  Total I/O In: 1000 [I.V.:1000] Out: 950 [Urine:700; Blood:250]  BLOOD ADMINISTERED:none  DRAINS: none   LOCAL MEDICATIONS USED:  MARCAINE     SPECIMEN:  Source of Specimen:  uterus cervix, tubes  DISPOSITION OF SPECIMEN:  PATHOLOGY  COUNTS:  YES  TOURNIQUET:  * No tourniquets in log *  DICTATION: .Other Dictation: Dictation Number [id number]  PLAN OF CARE: Admit to inpatient   PATIENT DISPOSITION:  PACU - hemodynamically stable.   Delay start of Pharmacological VTE agent (>24hrs) due to surgical blood loss or risk of bleeding: no

## 2014-08-30 NOTE — Transfer of Care (Signed)
Immediate Anesthesia Transfer of Care Note  Patient: [name]  Procedure(s) Performed: Procedure(s): ROBOTIC ASSISTED ATTEMPTED, THEN CONVERSION TO OPEN TOTAL ABDOMINAL HYSTERECTOMY WITH BILATERAL SALPINGECTOMY (N/A) HERNIA REPAIR UMBILICAL ADULT (N/A)  Patient Location: PACU  Anesthesia Type:General  Level of Consciousness: awake, alert , oriented and patient cooperative  Airway & Oxygen Therapy: Patient Spontanous Breathing and Patient connected to face mask oxygen  Post-op Assessment: Report given to RN and Post -op Vital signs reviewed and stable  Post vital signs: Reviewed and stable  Last Vitals:  Filed Vitals:   08/30/14 0729  BP: 178/97  Pulse:   Temp:   Resp:     Complications: No apparent anesthesia complications

## 2014-08-30 NOTE — Anesthesia Procedure Notes (Signed)
Procedure Name: Intubation Date/Time: 08/30/2014 9:26 AM Performed by: Dione Booze Pre-anesthesia Checklist: Patient identified, Emergency Drugs available, Suction available and Patient being monitored Patient Re-evaluated:Patient Re-evaluated prior to inductionOxygen Delivery Method: Circle system utilized Preoxygenation: Pre-oxygenation with 100% oxygen Intubation Type: IV induction Laryngoscope Size: Mac and 4 Grade View: Grade I Tube type: Oral Tube size: 7.5 mm Number of attempts: 1 Airway Equipment and Method: Stylet Placement Confirmation: ETT inserted through vocal cords under direct vision,  positive ETCO2 and breath sounds checked- equal and bilateral Secured at: 21 cm Tube secured with: Tape Dental Injury: Teeth and Oropharynx as per pre-operative assessment

## 2014-08-30 NOTE — Anesthesia Postprocedure Evaluation (Signed)
  Anesthesia Post-op Note  Patient: [name]  Procedure(s) Performed: Procedure(s) (LRB): ROBOTIC ASSISTED ATTEMPTED, THEN CONVERSION TO OPEN TOTAL ABDOMINAL HYSTERECTOMY WITH BILATERAL SALPINGECTOMY (N/A) HERNIA REPAIR UMBILICAL ADULT (N/A)  Patient Location: PACU  Anesthesia Type: General  Level of Consciousness: awake and alert   Airway and Oxygen Therapy: Patient Spontanous Breathing  Post-op Pain: mild  Post-op Assessment: Post-op Vital signs reviewed, Patient's Cardiovascular Status Stable, Respiratory Function Stable, Patent Airway and No signs of Nausea or vomiting  Last Vitals:  Filed Vitals:   08/30/14 1515  BP: 156/83  Pulse: 76  Temp:   Resp: 13    Post-op Vital Signs: stable   Complications: No apparent anesthesia complications

## 2014-08-30 NOTE — Interval H&P Note (Signed)
History and Physical Interval Note:  08/30/2014 8:29 AM  [name]  has presented today for surgery, with the diagnosis of umbilical hernia. The various methods of treatment have been discussed with the patient and family. After consideration of risks, benefits and other options for treatment, the patient has consented to  Procedure(s): HERNIA REPAIR UMBILICAL ADULT (N/A) as a surgical intervention .  The patient's history has been reviewed, patient examined, no change in status, stable for surgery.  I have reviewed the patient's chart and labs.  Questions were answered to the patient's satisfaction.     TOTH III,Jayma Volpi S

## 2014-08-30 NOTE — H&P (Signed)
[name] is an 44 y.o. female. G2P2 DBF hx TL presents for planned da Vinci robotic total hysterectomy, bilateral salpingectomy due to heavy and painful cycles despite endometrial ablation. Hx notable for prior C-section. Pt has had severe iron deficiency anemia treated with IV iron infusion. Pt has been using narcotic for pain mgmt related to cycle. She has an umbilical hernia which will be repaired today by Dr Marlou Starks. sonogram showed normal ovaries, multiple uterine fibroids  Pertinent Gynecological History: Menses: excessive and painful Bleeding: heavy vaginal bleeding Contraception: tubal ligation DES exposure: denies Blood transfusions: none Sexually transmitted diseases: no past history Previous GYN Procedures: hysteroscopy, D&C, endometrial ablation, LTL  Last mammogram: normal Date: 2016 Last pap: normal Date: 2016 OB History: G2, P2   Menstrual History: Menarche age: n/a LMP 7/25    Past Medical History  Diagnosis Date  . Hypertension   . Insulin resistance   . Arthritis   . Anemia   . Umbilical hernia   . Fibroids     uterine  . Menorrhagia     Past Surgical History  Procedure Laterality Date  . Novasure ablation  06-20-2008  . Hysteroscopy w/d&c  06-20-2008  . Tubal ligation  06-20-2008  . Cryotherapy  1989  . Cesarean section    . Tmj arthroplasty  1998    has screws in both side of jaw per pt    Family History  Problem Relation Age of Onset  . Breast cancer Mother   . Hypertension Mother   . Diabetes Mother   . Cancer Mother     breast  . Breast cancer Maternal Aunt   . Cancer Maternal Aunt     beast  . Hypertension Father     Social History:  reports that she has never smoked. She has never used smokeless tobacco. She reports that she does not drink alcohol or use illicit drugs. Divorced  Allergies: No Known Allergies No known latex allergy No prescriptions prior to admission    Review of Systems  All other systems reviewed and are  negative.   There were no vitals taken for this visit. Physical Exam  Constitutional: She is oriented to person, place, and time. She appears well-developed and well-nourished.  HENT:  Head: Atraumatic.  Eyes: EOM are normal.  Neck: Neck supple.  Cardiovascular: Regular rhythm.   Respiratory: Breath sounds normal.  GI: Soft.  (+) umbilical hernia pfannenstiel skin incision  Genitourinary:  Vulva nl Vagina (+) blood Cervix anterior bulbous, Uterus RV 13 weeks size Adnexa nonpalp  Musculoskeletal: She exhibits no edema.  Neurological: She is alert and oriented to person, place, and time.  Skin: Skin is warm and dry.  Psychiatric: She has a normal mood and affect.    CBC Latest Ref Rng 08/24/2014 08/12/2012 06/19/2008  WBC 4.0 - 10.5 K/uL 7.3 7.7 10.8(H)  Hemoglobin 12.0 - 15.0 g/dL 11.8(L) 12.1 8.8(L)  Hematocrit 36.0 - 46.0 % 37.4 37.1 27.9(L)  Platelets 150 - 400 K/uL 458(H) 523(H) 607(H)       Assessment/Plan: Menorrhagia Uterine fibroid IDA Previous endometrial ablation, C/S Dysmenorrhea P) da Vinci robotic total hysterectomy, bilateral salpingectomy. Procedure explained. Risk reviewed including infection, bleeding, poss need for blood transfusion and its risk( hiv, hepatitis, acute rxn), injury to surrounding organ structures, poss need to open , poss need for surgery in the future due to ovarian disease, bowel obstruction due to scar tissue. ALL ? answered  Keyler Hoge A 08/30/2014, 4:10 AM

## 2014-08-31 ENCOUNTER — Encounter (HOSPITAL_COMMUNITY): Payer: Self-pay | Admitting: Obstetrics and Gynecology

## 2014-08-31 LAB — BASIC METABOLIC PANEL
Anion gap: 7 (ref 5–15)
BUN: 8 mg/dL (ref 6–20)
CO2: 26 mmol/L (ref 22–32)
Calcium: 8.8 mg/dL — ABNORMAL LOW (ref 8.9–10.3)
Chloride: 106 mmol/L (ref 101–111)
Creatinine, Ser: 0.87 mg/dL (ref 0.44–1.00)
GFR calc Af Amer: >60 mL/min (ref >60)
GFR calc non Af Amer: >60 mL/min (ref >60)
Glucose, Bld: 154 mg/dL — ABNORMAL HIGH (ref 65–99)
Potassium: 3.6 mmol/L (ref 3.5–5.1)
Sodium: 139 mmol/L (ref 135–145)

## 2014-08-31 LAB — CBC
HCT: 33.3 % — ABNORMAL LOW (ref 36.0–46.0)
Hemoglobin: 10.6 g/dL — ABNORMAL LOW (ref 12.0–15.0)
MCH: 27.2 pg (ref 26.0–34.0)
MCHC: 31.8 g/dL (ref 30.0–36.0)
MCV: 85.6 fL (ref 78.0–100.0)
Platelets: 407 10*3/uL — ABNORMAL HIGH (ref 150–400)
RBC: 3.89 MIL/uL (ref 3.87–5.11)
RDW: 25.4 % — ABNORMAL HIGH (ref 11.5–15.5)
WBC: 13.1 10*3/uL — ABNORMAL HIGH (ref 4.0–10.5)

## 2014-08-31 NOTE — Progress Notes (Signed)
Subjective: Patient reports tolerating PO and no problems voiding.    Objective: I have reviewed patient's vital signs.  vital signs, intake and output, medications and labs. Filed Vitals:   08/31/14 1000  BP: 178/85  Pulse: 94  Temp: 99.6 F (37.6 C)  Resp: 18   I/O last 3 completed shifts: In: 4218.3 [P.O.:360; I.V.:3858.3] Out: 1475 [Urine:1225; Blood:250] Total I/O In: 836.3 [P.O.:480; I.V.:356.3] Out: 770 [Urine:770]  Lab Results  Component Value Date   WBC 13.1* 08/31/2014   HGB 10.6* 08/31/2014   HCT 33.3* 08/31/2014   MCV 85.6 08/31/2014   PLT 407* 08/31/2014   Lab Results  Component Value Date   CREATININE 0.87 08/31/2014    EXAM General: alert, cooperative and no distress Resp: clear to auscultation bilaterally Cardio: regular rate and rhythm, S1, S2 normal, no murmur, click, rub or gallop GI: soft, non-tender; bowel sounds normal; no masses,  no organomegaly and incision: clean, dry and intact Extremities: extremities normal, atraumatic, no cyanosis or edema Vaginal Bleeding: minimal  Assessment: s/p Procedure(s): ROBOTIC ASSISTED ATTEMPTED, THEN CONVERSION TO OPEN TOTAL ABDOMINAL HYSTERECTOMY WITH BILATERAL SALPINGECTOMY HERNIA REPAIR UMBILICAL ADULT: stable, progressing well and tolerating diet HTN Plan: Advance diet Encourage ambulation Advance to PO medication Discontinue IV fluids dulcolax suppository prn  Anticipate d/c am  LOS: 1 day    Keigan Girten A, MD 08/31/2014 1:03 PM    08/31/2014, 1:03 PM

## 2014-08-31 NOTE — Op Note (Signed)
[name], [name] NO.:  [id number]  MEDICAL RECORD NO.:  [id number]  LOCATION:  2952                         FACILITY:  [facility]  PHYSICIAN:  Servando Salina, M.D.DATE OF BIRTH:  [birth year]  DATE OF PROCEDURE:  08/30/2014 DATE OF DISCHARGE:                              OPERATIVE REPORT   PREOPERATIVE DIAGNOSES:  Fibroid uterus, previous cesarean section, endometrial ablation, iron-deficiency anemia, dysmenorrhea.  PROCEDURES:  Robotic-assisted attempted hysterectomy,  exploratory laparotomy, total abdominal hysterectomy, bilateral salpingectomy.  POSTOPERATIVE DIAGNOSES:  Fibroid uterus, iron deficiency anemia, menorrhagia, history of cesarean section/endometrial ablation.  ANESTHESIA:  General.  SURGEON:  Servando Salina, MD  ASSISTANT:  Artelia Laroche, CNM  PROCEDURE IN DETAIL:  Under adequate general anesthesia, the patient was placed in the dorsal lithotomy position.  She was positioned with the plan for robotic surgery.  The patient had supraumbilical hernia for which repair was done by planned by Dr. Sherren Mocha.  Please see dictated operative report.  In light of the plan for robotic which uses laparoscopic port site, Dr. Marlou Starks proceeded with opening up and removing the umbilical hernia sac and subsequently placing the 12-mm trocar with a conversion sleeve into the opened umbilical region and using sutures that had been placed, cinched into the converter for placement/holding up the trocar.  While he was performing that procedure, I went below vaginally, the patient having been examined under anesthesia with a retroverted enlarged uterus and very bulbous anterior cervix.  Weighted speculum was placed.  0 Vicryl figure-of-eight sutures were placed on the anterior posterior lip of the cervix.  The uterus sounded to 10 cm. Uterine manipulator #10 was requested, subsequently placed with a large RUMI cup.  Once this was done, a 3-way Foley catheter was  also placed. The patient had Pyridium, therefore orange urine color was noted.  The retractors were then removed from the vagina.  The robotic portion of the case started with the robotic camera placed through the umbilical site.  The abdomen had been inflated so that to assess the leakage at the umbilical site.  On panoramic inspection, the patient had adhesion to the anterior abdominal wall.  On the right, some anterior upper  abdominal wall adhesions were noted and  small liver mass is present.  Attention was turned to the pelvis where the patient was placed in Trendelenburg position. There was evidence of adhesion of the round ligament on the right and some adhesions from the bladder.  The uterus was enlarged bulky. Robotic port sites were then placed in consideration of the adhesion to the anterior abdominal wall on the right.  Therefore, a 12 mm AirSeal trocar was placed in the right lower quadrant.  The robotic 8 mm port was placed proximal to that port.  Similarly, two 8 mm robotic port site was placed on the left using the camera to assess the placement.  At that point, the robot was then docked.  The robotic instruments were placed in arm #1 monopolar scissors; arm #2, PK dissector; and arm #3, the ProGrasp.  Procedure begun by manipulation of the uterus at which time we were not able to manipulate the uterus and thought that the [place]  RUMI apparatus had displaced.  I then went back down to the pelvis/vagina and indeed the uterine manipulator arm had dislodged from the RUMI cup and RUMI cup was still surrounding the cervix.  RUMI cup was removed.  Several attempts at reattachment of that apparatus was notable for the arm manipulator still not staying on the cuff. Therefore, it was also noted at the same time that the patient actually was not in a position on the bed for the manipulator to be functional, if it was able to be placed and remain in place.  The patient's buttock was not  hanging partially off the bed.  Therefore, the robot had to be undocked.  The instruments removed and the patient repositioned.  After several attempts, she was repositioned to perform the surgery.  The uterine manipulator issue remains at that point an issue.  Two other manipulators were subsequently brought in with placement and while using the robotic camera to assess, it was then noted that we still could not get the uterus up and mobile enough to certainly see the lower uterine segment and the adnexa in order to continue.  I then used a probe to try to lift the uterus, it was able to be done which thought at that point, if we could get the manipulator working.  The 3rd arm would then be able to assist and maintaining the uterus being lifted up and the procedure being done.  The AirSeal then apparatus had issue where it was not giving the gas distention that was necessary.  The instrument was not functioning well.  We still had the issue of the manipulator.  A 3rd manipulator was brought in.  The RUMI cup was then used.  This newer apparatus was then placed and again the same issue remained.  At this point, the patient had been under substantial time for her anesthesia while trying to get the procedure to go forward.  Given all these issues, decision was then made to just open the patient where she had prior cesarean section.  The patient was not taken out of the dorsal lithotomy position.  The port sites were removed and the patient had a Pfannenstiel incision made and carried down to the rectus fascia. Rectus fascia was then opened transversely.  The rectus fascia was then bluntly and sharply dissected off the rectus muscle in superior fashion. The rectus muscle was split in midline.  The parietal peritoneum was entered bluntly and incision extended.  The bowels were then packed upwardly.  A self-retaining Balfour retractor was then placed.  Bowel was then pushed upwardly.  The  uterus was then placed on traction with a single-toothed tenaculum.  The ovaries had shortened utero-ovarian ligaments.  The fallopian tubes were attaching and twisted particularly on the left side with adherence more to the anterior aspect of the uterus.  Procedure was started on the left with the round ligament being tied with 0 Vicryl suture figure-of-eight x2, transected with the cautery.  The anterior peritoneum was then opened.  The bladder had some adhesions, it was adherent and sharp dissection was then used to bring the bladder down.  The round ligament on the right was then identified. Similar procedure performed, transection with cautery.  The bladder flap was then opened further.  There was some still adhesions of the bladder anteriorly and this was sharply dissected again down taking care not to cause any injury.  The posterior leaf of the broad ligament was opened and due to the short  nature of the uterus, ovarian ligament, the LigaSure was requested, used to clamp, cauterize, and then cut the attachment.  It was also used for the mesosalpinx to transect the fallopian tube off the adnexa.  Bleeding was encountered on the distal base of the ovary on the left with cauterization that been effective and a 3-0 Vicryl figure-of-eight sutures being placed.  Uterine vessels were then skeletonized, doubly clamped, cut, and suture ligated with 0 Vicryl x2.  At that point, Dr. Sherren Mocha returned to close the fascial portion of the umbilical hernia.  The skin and part of the incision remained open, and went to the opposite side, performed the removal of the fallopian tube on the right, skeletonized the uterines, double clamped, cut, and suture ligated those vessels that allowed for the uterus to be truncated for better visualization of the surgical field.  Once the fundal portion was removed, the cervix was grasped posteriorly.  There were no adhesions.  Anteriorly, the bladder was then  continued to sharply be dissected down.  Ureter was noted on the right, the left was palpated. The procedure was continued via the cardinal ligaments were then serially clamped, bilaterally, cut, and then suture ligated.  The left utero-ovarian ligament was clamped, separately cut and suture ligated. At that point, while this was being done on both sides, the incidental opening of the cervicovaginal junction on the vaginal side was noted. An opportunity was then taken to use the curved scissors to circumferentially remove the cervix from it's vaginal attachment.  This was then done.  The vaginal cuff was then oversewn with 0 Vicryl running lock stitch circumferentially and then closed with interrupted 0 Vicryl figure-of-eight sutures.  Cauterization of small bleeding on the bladder side was then done.  Several other additional figure-of-eight sutures were placed at the vaginal cuff for hemostasis.  The left uterosacral ligament was then suspended to the vaginal cuff.  On the right, there was no attachment for that uterosacral ligament and it remained.  The pedicles were then inspected.  Good hemostasis noted.  Abdomen was then copiously irrigated and suctioned.  Good hemostasis was felt to be achieved.  The packings were removed.  The self-retaining retractor was removed.  The upper abdomen was explored digitally and the parietal peritoneum was then closed with 2-0 Vicryl.  The rectus fascia was closed with 0 Vicryl x2.  The subcutaneous area was irrigated, small bleeders cauterized.  Interrupted 2-0 plain sutures placed and the skin approximated with Ethicon staples.  What then remained was the umbilical hernia incision and additional port sites.  The port sites were closed with 4-0 Vicryl subcuticular suture.  Dr. Sherren Mocha had requested staples to be placed for the umbilical hernia site, however cosmetically 4-0 Vicryl subcuticular suture was then placed with good approximation  noted.  SPECIMEN:  Fallopian tubes, uterus, and cervix sent to Pathology.  ESTIMATED BLOOD LOSS:  250 ml.  INTRAOPERATIVE FLUID:  1 L.  URINE OUTPUT:  700 mL.  Sponge and instrument counts x2 was correct.  COMPLICATION:  None.  The patient tolerated the procedure well, was transferred to recovery in stable condition.     Servando Salina, M.D.     [place]/MEDQ  D:  08/31/2014  T:  08/31/2014  Job:  [id number]

## 2014-09-01 MED ORDER — IBUPROFEN 800 MG PO TABS: 800.0000 mg | ORAL_TABLET | Freq: Three times a day (TID) | ORAL | Status: DC | PRN

## 2014-09-01 MED ORDER — OXYCODONE-ACETAMINOPHEN 5-325 MG PO TABS: 1.0000 | ORAL_TABLET | ORAL | Status: DC | PRN

## 2014-09-01 MED ORDER — BISACODYL 10 MG RE SUPP
10.0000 mg | Freq: Once | RECTAL | Status: AC
  Administered 2014-09-01: 10 mg via RECTAL
  Filled 2014-09-01: qty 1

## 2014-09-01 MED ORDER — BISACODYL 10 MG RE SUPP
10.0000 mg | RECTAL | Status: DC | PRN
  Administered 2014-09-01: 10 mg via RECTAL
  Filled 2014-09-01: qty 1

## 2014-09-01 MED ORDER — SIMETHICONE 80 MG PO CHEW: 80.0000 mg | CHEWABLE_TABLET | Freq: Four times a day (QID) | ORAL | Status: DC | PRN

## 2014-09-01 NOTE — Progress Notes (Signed)
Subjective: Patient reports tolerating PO and no problems voiding pain controlled. No flatus as yet.   Ambulating and using incentive spirometry Objective: I have reviewed patient's vital signs.  vital signs and pathology. Filed Vitals:   09/01/14 0555  BP: 162/89  Pulse: 82  Temp: 99.1 F (37.3 C)  Resp: 19   I/O last 3 completed shifts: In: 3176.3 [P.O.:1320; I.V.:1856.3] Out: 2645 [Urine:2645]    Lab Results  Component Value Date   WBC 13.1* 08/31/2014   HGB 10.6* 08/31/2014   HCT 33.3* 08/31/2014   MCV 85.6 08/31/2014   PLT 407* 08/31/2014   Lab Results  Component Value Date   CREATININE 0.87 08/31/2014    EXAM General: alert, cooperative and no distress Resp: clear to auscultation bilaterally Cardio: regular rate and rhythm, S1, S2 normal, no murmur, click, rub or gallop GI: soft, non-tender; bowel sounds normal; no masses,  no organomegaly and incision: clean, dry and intact Extremities: extremities normal, atraumatic, no cyanosis or edema  Vaginal Bleeding: none Path reviewed with pt: 403 gram fibroid uterus, nl tubes Assessment: s/p Procedure(s): ROBOTIC ASSISTED ATTEMPTED, THEN CONVERSION TO OPEN TOTAL ABDOMINAL HYSTERECTOMY WITH BILATERAL SALPINGECTOMY HERNIA REPAIR UMBILICAL ADULT: stable, tolerating diet and anemia  Plan: Dulcolax suppository. Continue routine postop care Discharge home after flatus/BM and remains afebrile D/c instructions reviewed. Staple removal in office Thursday Postop f/u 4 weeks   LOS: 2 days    Zi Newbury A, MD 09/01/2014 10:13 AM    09/01/2014, 10:13 AM

## 2014-09-01 NOTE — Progress Notes (Signed)
Assessment unchanged. Pt discharged via wc to fornt entrance to meet awaiting vehicle to carry home. Accompanied by family and NT.

## 2014-09-01 NOTE — Discharge Instructions (Signed)
Call if temperature greater than equal to 100.4, nothing per vagina for 4-6 weeks or severe nausea vomiting, increased incisional pain , drainage or redness in the incision site, no straining with bowel movements, showers no bath °

## 2014-09-01 NOTE — Progress Notes (Signed)
Pt reported passing flatus and lunch order on the way. Plans to eat lunch and discharge home per order. Already reviewed AVS with pt who verbalized understanding through teach back including follow up care, medications to resume and when to call the doctor. Script x 1 given as provided by MD.

## 2014-09-01 NOTE — Discharge Summary (Signed)
Physician Discharge Summary  Patient ID: [name] MRN: [id number] DOB/AGE: [birth year] 44 y.o.  Admit date: 08/30/2014 Discharge date: 09/01/2014  Admission Diagnoses: IDA, fibroid uterus, menorrhagia, dysmenorrhea, umbilical hernia Hx previous endometrial ablation, s/p cesarean section, HTN  Discharge Diagnoses: same Active Problems:   S/P hysterectomy  IDA Chronic HTn  Discharged Condition: stable  Hospital Course: see operative note. Pt underwent attempted robotic hysterectomy, exp lap, TAH, bilateral salpingectomy, umbilical hernia repair. Pt had resumption of antihypertensive meds but needs more control. Uncomplicated postop course  Consults: None  Significant Diagnostic Studies: labs:  CBC    Component Value Date/Time   WBC 13.1* 08/31/2014 0440   RBC 3.89 08/31/2014 0440   HGB 10.6* 08/31/2014 0440   HCT 33.3* 08/31/2014 0440   PLT 407* 08/31/2014 0440   MCV 85.6 08/31/2014 0440   MCH 27.2 08/31/2014 0440   MCHC 31.8 08/31/2014 0440   RDW 25.4* 08/31/2014 0440   LYMPHSABS 3.7 08/12/2012 0836   MONOABS 0.5 08/12/2012 0836   EOSABS 0.1 08/12/2012 0836   BASOSABS 0.1 08/12/2012 0836    Lab Results  Component Value Date   CREATININE 0.87 08/31/2014   CREATININE 0.71 08/24/2014   CREATININE 0.81 05/01/2013    Treatments: surgery: attempted robotic hysterectomy, exp lap, tah, bilateral salpingectomy, umb hernia repair.   Discharge Exam: Blood pressure 162/89, pulse 82, temperature 99.1 F (37.3 C), temperature source Oral, resp. rate 19, height 5\' 6"  (1.676 m), weight 98.544 kg (217 lb 4 oz), last menstrual period 08/16/2014, SpO2 100 %. General appearance: alert, cooperative and no distress Resp: clear to auscultation bilaterally Cardio: regular rate and rhythm, S1, S2 normal, no murmur, click, rub or gallop GI: soft, non-tender; bowel sounds normal; no masses,  no organomegaly Pelvic: deferred Extremities: no edema, redness or tenderness in the calves or  thighs Incision/Wound: dry/clean/intact. Umbilical site pack removed  Disposition: 01-Home or Self Care  Discharge Instructions    Call MD for:  difficulty breathing, headache or visual disturbances    Complete by:  As directed      Call MD for:  extreme fatigue    Complete by:  As directed      Call MD for:  hives    Complete by:  As directed      Call MD for:  persistant dizziness or light-headedness    Complete by:  As directed      Call MD for:  persistant nausea and vomiting    Complete by:  As directed      Call MD for:  redness, tenderness, or signs of infection (pain, swelling, redness, odor or green/yellow discharge around incision site)    Complete by:  As directed      Call MD for:  severe uncontrolled pain    Complete by:  As directed      Call MD for:  temperature >100.4    Complete by:  As directed      Diet - low sodium heart healthy    Complete by:  As directed      Diet - low sodium heart healthy    Complete by:  As directed      Discharge instructions    Complete by:  As directed   May shower. No heavy lifting. Diet as tolerated     Discharge instructions    Complete by:  As directed   Call if temperature greater than equal to 100.4, nothing per vagina for 4-6 weeks or severe nausea vomiting, increased incisional  pain , drainage or redness in the incision site, no straining with bowel movements, showers no bath     Discharge patient    Complete by:  As directed   After passing flatus/or BM, remain afebrile< 100.4     Increase activity slowly    Complete by:  As directed      Increase activity slowly    Complete by:  As directed      No wound care    Complete by:  As directed      No wound care    Complete by:  As directed             Medication List    TAKE these medications        acetaminophen 500 MG tablet  Commonly known as:  TYLENOL  Take 1,000 mg by mouth every 6 (six) hours as needed for mild pain or moderate pain.     atenolol 25 MG  tablet  Commonly known as:  TENORMIN  Take 1 tablet (25 mg total) by mouth 2 (two) times daily.     furosemide 20 MG tablet  Commonly known as:  LASIX  Take 1 tablet (20 mg total) by mouth daily.     ibuprofen 800 MG tablet  Commonly known as:  ADVIL,MOTRIN  Take 1 tablet (800 mg total) by mouth every 8 (eight) hours as needed (mild pain).     lisinopril 40 MG tablet  Commonly known as:  PRINIVIL,ZESTRIL  Take 1 tablet (40 mg total) by mouth daily.     LORazepam 1 MG tablet  Commonly known as:  ATIVAN  Take 1 tablet (1 mg total) by mouth at bedtime.     oxyCODONE-acetaminophen 5-325 MG per tablet  Commonly known as:  PERCOCET/ROXICET  Take 1-2 tablets by mouth every 4 (four) hours as needed for severe pain (moderate to severe pain (when tolerating fluids)).     Potassium Chloride ER 20 MEQ Tbcr  Take 10 mEq by mouth daily.     simethicone 80 MG chewable tablet  Commonly known as:  MYLICON  Chew 1 tablet (80 mg total) by mouth 4 (four) times daily as needed for flatulence.           Follow-up Information    Follow up with TOTH III,PAUL S, MD In 3 weeks.   Specialty:  General Surgery   Contact information:   [address] [place] [address] [phone]       Follow up On 09/06/2014.   Why:  staple removal RN      Follow up with Brizeyda Holtmeyer A, MD In 4 weeks.   Specialty:  Obstetrics and Gynecology   Contact information:   [place] [address] [phone]       Follow up with Marvene Staff, MD.   Specialty:  Obstetrics and Gynecology   Contact information:   [address] [place] [address] [phone]       Signed: Servando Salina A 09/01/2014, 10:14 AM

## 2014-09-12 ENCOUNTER — Telehealth: Payer: Self-pay | Admitting: Behavioral Health

## 2014-09-12 ENCOUNTER — Encounter: Payer: Self-pay | Admitting: Behavioral Health

## 2014-09-12 NOTE — Telephone Encounter (Signed)
Pre-Visit Call completed with patient and chart updated.   Pre-Visit Info documented in Specialty Comments under [employer].    

## 2014-09-13 ENCOUNTER — Encounter: Payer: Self-pay | Admitting: Family Medicine

## 2014-09-13 ENCOUNTER — Ambulatory Visit (INDEPENDENT_AMBULATORY_CARE_PROVIDER_SITE_OTHER): Payer: BC Managed Care – PPO | Admitting: Family Medicine

## 2014-09-13 VITALS — BP 158/106 | HR 80 | Temp 98.9°F | Ht 66.0 in | Wt 219.2 lb

## 2014-09-13 DIAGNOSIS — E559 Vitamin D deficiency, unspecified: Secondary | ICD-10-CM

## 2014-09-13 DIAGNOSIS — D500 Iron deficiency anemia secondary to blood loss (chronic): Secondary | ICD-10-CM

## 2014-09-13 DIAGNOSIS — E781 Pure hyperglyceridemia: Secondary | ICD-10-CM

## 2014-09-13 DIAGNOSIS — I10 Essential (primary) hypertension: Secondary | ICD-10-CM

## 2014-09-13 MED ORDER — AMLODIPINE BESYLATE 5 MG PO TABS: 5.0000 mg | ORAL_TABLET | Freq: Every day | ORAL | Status: DC

## 2014-09-13 NOTE — Progress Notes (Signed)
Pre visit review using our clinic review tool, if applicable. No additional management support is needed unless otherwise documented below in the visit note. 

## 2014-09-13 NOTE — Patient Instructions (Signed)

## 2014-09-14 NOTE — Assessment & Plan Note (Signed)
D/c ACEI secondary to thicking of throat and tongue.  Start Norvasc 5 mg daily con't atenolol and lasix rto 2-3 weeks

## 2014-09-14 NOTE — Progress Notes (Signed)
Patient ID: [name], female    DOB: 1970/03/30  Age: 44 y.o. MRN: [id number]    Subjective:  Subjective HPI [name] presents to establish and bp check.  She is concerned because her bp has been high. She c/o of throat feeling thick and difficulty swallowing.  No chest pain or sob.  That feeling only just started a few weeks ago.  No problems breathing.    Review of Systems  Constitutional: Negative for diaphoresis, appetite change, fatigue and unexpected weight change.  Eyes: Negative for pain, redness and visual disturbance.  Respiratory: Negative for cough, chest tightness, shortness of breath and wheezing.   Cardiovascular: Negative for chest pain, palpitations and leg swelling.  Endocrine: Negative for cold intolerance, heat intolerance, polydipsia, polyphagia and polyuria.  Genitourinary: Negative for dysuria, frequency and difficulty urinating.  Neurological: Negative for dizziness, light-headedness, numbness and headaches.    History Past Medical History  Diagnosis Date  . Hypertension   . Insulin resistance   . Arthritis   . Anemia   . Umbilical hernia   . Fibroids     uterine  . Menorrhagia     She has past surgical history that includes Novasure ablation (06-20-2008); Hysteroscopy w/D&C (06-20-2008); tubal ligation (06-20-2008); Cryotherapy (1989); Cesarean section; TMJ Arthroplasty (1998); Robotic assisted total hysterectomy (N/A, 05/02/6544); and Umbilical hernia repair (N/A, 08/30/2014).   Her family history includes Breast cancer in her maternal aunt and mother; Cancer in her maternal aunt and mother; Diabetes in her mother; Hypertension in her father and mother.She reports that she has never smoked. She has never used smokeless tobacco. She reports that she does not drink alcohol or use illicit drugs.  Current Outpatient Prescriptions on File Prior to Visit  Medication Sig Dispense Refill  . atenolol (TENORMIN) 25 MG tablet Take 1 tablet (25 mg total) by mouth  2 (two) times daily. 180 tablet 0  . furosemide (LASIX) 20 MG tablet Take 1 tablet (20 mg total) by mouth daily. 30 tablet 1  . potassium chloride 20 MEQ TBCR Take 10 mEq by mouth daily. 30 tablet 3   No current facility-administered medications on file prior to visit.     Objective:  Objective Physical Exam  Constitutional: She is oriented to person, place, and time. She appears well-developed and well-nourished.  HENT:  Head: Normocephalic and atraumatic.  Eyes: Conjunctivae and EOM are normal.  Neck: Normal range of motion. Neck supple. No JVD present. Carotid bruit is not present. No thyromegaly present.  Cardiovascular: Normal rate, regular rhythm and normal heart sounds.   No murmur heard. Pulmonary/Chest: Effort normal and breath sounds normal. No respiratory distress. She has no wheezes. She has no rales. She exhibits no tenderness.  Musculoskeletal: She exhibits no edema.  Neurological: She is alert and oriented to person, place, and time.  Psychiatric: She has a normal mood and affect. Her behavior is normal.  Nursing note and vitals reviewed.  BP 158/106 mmHg  Pulse 80  Temp(Src) 98.9 F (37.2 C) (Oral)  Ht 5\' 6"  (1.676 m)  Wt 219 lb 3.2 oz (99.428 kg)  BMI 35.40 kg/m2  SpO2 98%  LMP 08/13/2014 Wt Readings from Last 3 Encounters:  09/13/14 219 lb 3.2 oz (99.428 kg)  08/30/14 217 lb 4 oz (98.544 kg)  08/24/14 223 lb (101.152 kg)     Lab Results  Component Value Date   WBC 13.1* 08/31/2014   HGB 10.6* 08/31/2014   HCT 33.3* 08/31/2014   PLT 407* 08/31/2014  GLUCOSE 154* 08/31/2014   CHOL 169 08/12/2012   TRIG 152* 08/12/2012   HDL 40 08/12/2012   LDLCALC 99 08/12/2012   ALT 11 08/12/2012   AST 12 08/12/2012   NA 139 08/31/2014   K 3.6 08/31/2014   CL 106 08/31/2014   CREATININE 0.87 08/31/2014   BUN 8 08/31/2014   CO2 26 08/31/2014   TSH 2.047 08/12/2012    No results found.   Assessment & Plan:  Plan I have discontinued Ms. Belshe's  lisinopril, LORazepam, acetaminophen, ibuprofen, oxyCODONE-acetaminophen, and simethicone. I am also having her start on amLODipine. Additionally, I am having her maintain her Potassium Chloride ER, atenolol, and furosemide. We will stop administering hepatitis B vac recombinant for pediatrics.  Meds ordered this encounter  Medications  . amLODipine (NORVASC) 5 MG tablet    Sig: Take 1 tablet (5 mg total) by mouth daily.    Dispense:  90 tablet    Refill:  1    Problem List Items Addressed This Visit    Vitamin D deficiency   Relevant Orders   Vit D  25 hydroxy (rtn osteoporosis monitoring)   HTN (hypertension) - Primary   Relevant Medications   amLODipine (NORVASC) 5 MG tablet   Other Relevant Orders   Comprehensive metabolic panel   Lipid panel   Anemia   Relevant Orders   Vit D  25 hydroxy (rtn osteoporosis monitoring)    Other Visit Diagnoses    Hypertriglyceridemia        Relevant Medications    amLODipine (NORVASC) 5 MG tablet    Other Relevant Orders    Lipid panel       Follow-up: Return in about 2 weeks (around 09/27/2014), or if symptoms worsen or fail to improve, for bp check.  Garnet Koyanagi, DO

## 2014-09-28 ENCOUNTER — Ambulatory Visit: Payer: BC Managed Care – PPO | Admitting: Family Medicine

## 2014-10-02 ENCOUNTER — Encounter: Payer: Self-pay | Admitting: Family Medicine

## 2014-10-02 ENCOUNTER — Ambulatory Visit (INDEPENDENT_AMBULATORY_CARE_PROVIDER_SITE_OTHER): Payer: BC Managed Care – PPO | Admitting: Family Medicine

## 2014-10-02 VITALS — BP 132/96 | HR 68 | Temp 98.2°F | Ht 66.0 in | Wt 217.2 lb

## 2014-10-02 DIAGNOSIS — I10 Essential (primary) hypertension: Secondary | ICD-10-CM | POA: Diagnosis not present

## 2014-10-02 NOTE — Progress Notes (Signed)
Pre visit review using our clinic review tool, if applicable. No additional management support is needed unless otherwise documented below in the visit note. 

## 2014-10-02 NOTE — Progress Notes (Signed)
Subjective:    Patient ID: [name], female    DOB: May 23, 1970, 44 y.o.   MRN: [id number]  Chief Complaint  Patient presents with  . Hypertension    follow up    HPI Patient is in today for bp check.  No complaints.  Pt states bp was better at ob office but always-- 130s /  80s   Past Medical History  Diagnosis Date  . Hypertension   . Insulin resistance   . Arthritis   . Anemia   . Umbilical hernia   . Fibroids     uterine  . Menorrhagia     Past Surgical History  Procedure Laterality Date  . Novasure ablation  06-20-2008  . Hysteroscopy w/d&c  06-20-2008  . Tubal ligation  06-20-2008  . Cryotherapy  1989  . Cesarean section    . Tmj arthroplasty  1998    has screws in both side of jaw per pt  . Robotic assisted total hysterectomy N/A 08/30/2014    Procedure: ROBOTIC ASSISTED ATTEMPTED, THEN CONVERSION TO OPEN TOTAL ABDOMINAL HYSTERECTOMY WITH BILATERAL SALPINGECTOMY;  Surgeon: Servando Salina, MD;  Location: [facility];  Service: Gynecology;  Laterality: N/A;  . Umbilical hernia repair N/A 08/30/2014    Procedure: HERNIA REPAIR UMBILICAL ADULT;  Surgeon: Autumn Messing III, MD;  Location: [facility];  Service: General;  Laterality: N/A;    Family History  Problem Relation Age of Onset  . Breast cancer Mother   . Hypertension Mother   . Diabetes Mother   . Cancer Mother     breast  . Breast cancer Maternal Aunt   . Cancer Maternal Aunt     beast  . Hypertension Father     Social History   Social History  . Marital Status: Divorced    Spouse Name: N/A  . Number of Children: N/A  . Years of Education: N/A   Occupational History  . Not on file.   Social History Main Topics  . Smoking status: Never Smoker   . Smokeless tobacco: Never Used  . Alcohol Use: No  . Drug Use: No  . Sexual Activity: Yes    Birth Control/ Protection: Surgical   Other Topics Concern  . Not on file   Social History Narrative    Outpatient Prescriptions Prior to Visit    Medication Sig Dispense Refill  . amLODipine (NORVASC) 5 MG tablet Take 1 tablet (5 mg total) by mouth daily. 90 tablet 1  . atenolol (TENORMIN) 25 MG tablet Take 1 tablet (25 mg total) by mouth 2 (two) times daily. 180 tablet 0  . furosemide (LASIX) 20 MG tablet Take 1 tablet (20 mg total) by mouth daily. 30 tablet 1  . potassium chloride 20 MEQ TBCR Take 10 mEq by mouth daily. 30 tablet 3   No facility-administered medications prior to visit.    Allergies  Allergen Reactions  . Lisinopril Swelling and Other (See Comments)    Cough     Review of Systems  Constitutional: Negative for fever and malaise/fatigue.  HENT: Negative for congestion.   Eyes: Negative for discharge.  Respiratory: Negative for shortness of breath.   Cardiovascular: Negative for chest pain, palpitations and leg swelling.  Gastrointestinal: Negative for nausea and abdominal pain.  Genitourinary: Negative for dysuria.  Musculoskeletal: Negative for falls.  Skin: Negative for rash.  Neurological: Negative for loss of consciousness and headaches.  Endo/Heme/Allergies: Negative for environmental allergies.  Psychiatric/Behavioral: Negative for depression. The patient is not nervous/anxious.  Objective:    Physical Exam  Constitutional: She is oriented to person, place, and time. She appears well-developed and well-nourished.  HENT:  Head: Normocephalic and atraumatic.  Eyes: Conjunctivae and EOM are normal.  Neck: Normal range of motion. Neck supple. No JVD present. Carotid bruit is not present. No thyromegaly present.  Cardiovascular: Normal rate, regular rhythm and normal heart sounds.   No murmur heard. Pulmonary/Chest: Effort normal and breath sounds normal. No respiratory distress. She has no wheezes. She has no rales. She exhibits no tenderness.  Musculoskeletal: She exhibits no edema.  Neurological: She is alert and oriented to person, place, and time.  Psychiatric: She has a normal mood  and affect. Her behavior is normal.    BP 132/96 mmHg  Pulse 68  Temp(Src) 98.2 F (36.8 C) (Oral)  Ht 5\' 6"  (1.676 m)  Wt 217 lb 3.2 oz (98.521 kg)  BMI 35.07 kg/m2  SpO2 98%  LMP 08/13/2014 Wt Readings from Last 3 Encounters:  10/02/14 217 lb 3.2 oz (98.521 kg)  09/13/14 219 lb 3.2 oz (99.428 kg)  08/30/14 217 lb 4 oz (98.544 kg)     Lab Results  Component Value Date   WBC 13.1* 08/31/2014   HGB 10.6* 08/31/2014   HCT 33.3* 08/31/2014   PLT 407* 08/31/2014   GLUCOSE 154* 08/31/2014   CHOL 169 08/12/2012   TRIG 152* 08/12/2012   HDL 40 08/12/2012   LDLCALC 99 08/12/2012   ALT 11 08/12/2012   AST 12 08/12/2012   NA 139 08/31/2014   K 3.6 08/31/2014   CL 106 08/31/2014   CREATININE 0.87 08/31/2014   BUN 8 08/31/2014   CO2 26 08/31/2014   TSH 2.047 08/12/2012    Lab Results  Component Value Date   TSH 2.047 08/12/2012   Lab Results  Component Value Date   WBC 13.1* 08/31/2014   HGB 10.6* 08/31/2014   HCT 33.3* 08/31/2014   MCV 85.6 08/31/2014   PLT 407* 08/31/2014   Lab Results  Component Value Date   NA 139 08/31/2014   K 3.6 08/31/2014   CO2 26 08/31/2014   GLUCOSE 154* 08/31/2014   BUN 8 08/31/2014   CREATININE 0.87 08/31/2014   BILITOT 0.5 08/12/2012   ALKPHOS 54 08/12/2012   AST 12 08/12/2012   ALT 11 08/12/2012   PROT 7.3 08/12/2012   ALBUMIN 4.4 08/12/2012   CALCIUM 8.8* 08/31/2014   ANIONGAP 7 08/31/2014   Lab Results  Component Value Date   CHOL 169 08/12/2012   Lab Results  Component Value Date   HDL 40 08/12/2012   Lab Results  Component Value Date   LDLCALC 99 08/12/2012   Lab Results  Component Value Date   TRIG 152* 08/12/2012   Lab Results  Component Value Date   CHOLHDL 4.2 08/12/2012   No results found for: HGBA1C     Assessment & Plan:   Problem List Items Addressed This Visit      Unprioritized   HTN (hypertension) - Primary    con't norvasc Take atenolol bid -- pt requested to try that before adding  meds         I am having Ms. Sakurai maintain her Potassium Chloride ER, atenolol, furosemide, and amLODipine.  No orders of the defined types were placed in this encounter.  2,  Flu shot refused   Garnet Koyanagi, DO

## 2014-10-02 NOTE — Assessment & Plan Note (Signed)
con't norvasc Take atenolol bid -- pt requested to try that before adding meds

## 2014-10-02 NOTE — Patient Instructions (Signed)

## 2014-11-08 ENCOUNTER — Other Ambulatory Visit: Payer: Self-pay

## 2014-11-08 ENCOUNTER — Telehealth: Payer: Self-pay

## 2014-11-08 MED ORDER — FUROSEMIDE 20 MG PO TABS: 20.0000 mg | ORAL_TABLET | Freq: Every day | ORAL | Status: DC

## 2014-11-08 MED ORDER — AMLODIPINE BESYLATE 10 MG PO TABS: 10.0000 mg | ORAL_TABLET | Freq: Every day | ORAL | Status: DC

## 2014-11-08 NOTE — Telephone Encounter (Signed)
Patient notified.regarding Norvasc order.

## 2014-11-08 NOTE — Telephone Encounter (Signed)
Start taking 2 norvasc take for 10 mg total---- ov next week

## 2014-11-08 NOTE — Telephone Encounter (Signed)
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 States she was supposed to call and let you know. Also needs refill on Lasix(refill request completed).

## 2015-01-11 ENCOUNTER — Ambulatory Visit: Payer: BC Managed Care – PPO | Admitting: Family Medicine

## 2015-02-19 ENCOUNTER — Telehealth: Payer: Self-pay | Admitting: Family Medicine

## 2015-02-27 NOTE — Telephone Encounter (Signed)
.  mychart

## 2015-03-06 ENCOUNTER — Telehealth: Payer: Self-pay | Admitting: Family Medicine

## 2015-03-06 NOTE — Telephone Encounter (Signed)
LVM inquiring if patient received flu shot  °

## 2015-08-12 ENCOUNTER — Encounter: Payer: Self-pay | Admitting: Family Medicine

## 2015-08-12 ENCOUNTER — Ambulatory Visit (INDEPENDENT_AMBULATORY_CARE_PROVIDER_SITE_OTHER): Payer: BC Managed Care – PPO | Admitting: Family Medicine

## 2015-08-12 VITALS — BP 145/86 | HR 66 | Temp 99.1°F | Ht 66.0 in | Wt 209.8 lb

## 2015-08-12 DIAGNOSIS — F411 Generalized anxiety disorder: Secondary | ICD-10-CM | POA: Diagnosis not present

## 2015-08-12 DIAGNOSIS — I10 Essential (primary) hypertension: Secondary | ICD-10-CM | POA: Diagnosis not present

## 2015-08-12 DIAGNOSIS — F419 Anxiety disorder, unspecified: Secondary | ICD-10-CM | POA: Diagnosis not present

## 2015-08-12 DIAGNOSIS — E876 Hypokalemia: Secondary | ICD-10-CM | POA: Diagnosis not present

## 2015-08-12 MED ORDER — LORAZEPAM 1 MG PO TABS: 1.0000 mg | ORAL_TABLET | Freq: Every day | ORAL | Status: DC

## 2015-08-12 MED ORDER — POTASSIUM CHLORIDE ER 10 MEQ PO TBCR: 10.0000 meq | EXTENDED_RELEASE_TABLET | Freq: Every day | ORAL | Status: DC

## 2015-08-12 MED ORDER — FUROSEMIDE 20 MG PO TABS: 20.0000 mg | ORAL_TABLET | Freq: Every day | ORAL | Status: DC

## 2015-08-12 MED ORDER — METOPROLOL SUCCINATE ER 50 MG PO TB24: 50.0000 mg | ORAL_TABLET | Freq: Every day | ORAL | Status: DC

## 2015-08-12 MED ORDER — AMLODIPINE BESYLATE 10 MG PO TABS: 10.0000 mg | ORAL_TABLET | Freq: Every day | ORAL | Status: DC

## 2015-08-12 MED FILL — METOPROLOL SUCC ER 50 MG TA: 50 | 90 days supply | Qty: 90 | Fill #0

## 2015-08-12 MED FILL — FUROSEMIDE 20 MG TABLET: 20 | 30 days supply | Qty: 30 | Fill #0

## 2015-08-12 MED FILL — POTASSIUM CL 10 MEQ TAB SA: 10 | 90 days supply | Qty: 90 | Fill #0

## 2015-08-12 MED FILL — LORazepam 1 MG TABS: 1 | 30 days supply | Qty: 30 | Fill #0

## 2015-08-12 MED FILL — AMLODIPINE BESYLATE 10 MG T: 10 | 90 days supply | Qty: 90 | Fill #0

## 2015-08-12 NOTE — Progress Notes (Addendum)
Patient ID: [name], female    DOB: [birth year]  Age: 45 y.o. MRN: [id number]    Subjective:  Subjective HPI [name] presents for f/u bp ---  No complaints.  Pt also needs refills on ativan and potassium.   She is forgetting the second dose of atenolol so a qd med would be better.     Review of Systems  Constitutional: Negative for diaphoresis, appetite change, fatigue and unexpected weight change.  Eyes: Negative for pain, redness and visual disturbance.  Respiratory: Negative for cough, chest tightness, shortness of breath and wheezing.   Cardiovascular: Negative for chest pain, palpitations and leg swelling.  Endocrine: Negative for cold intolerance, heat intolerance, polydipsia, polyphagia and polyuria.  Genitourinary: Negative for dysuria, frequency and difficulty urinating.  Neurological: Negative for dizziness, light-headedness, numbness and headaches.    History Past Medical History  Diagnosis Date  . Hypertension   . Insulin resistance   . Arthritis   . Anemia   . Umbilical hernia   . Fibroids     uterine  . Menorrhagia     She has past surgical history that includes [place] ablation (06-20-2008); Hysteroscopy w/D&C (06-20-2008); tubal ligation (06-20-2008); Cryotherapy (1989); Cesarean section; TMJ Arthroplasty (1998); Robotic assisted total hysterectomy (N/A, [id number]); and Umbilical hernia repair (N/A, 08/30/2014).   Her family history includes Breast cancer in her maternal aunt and mother; Cancer in her maternal aunt and mother; Diabetes in her mother; Hypertension in her father and mother.She reports that she has never smoked. She has never used smokeless tobacco. She reports that she does not drink alcohol or use illicit drugs.  No current outpatient prescriptions on file prior to visit.   No current facility-administered medications on file prior to visit.     Objective:  Objective Physical Exam  Constitutional: She is oriented to person, place, and  time. She appears well-developed and well-nourished.  HENT:  Head: Normocephalic and atraumatic.  Eyes: Conjunctivae and EOM are normal.  Neck: Normal range of motion. Neck supple. No JVD present. Carotid bruit is not present. No thyromegaly present.  Cardiovascular: Normal rate, regular rhythm and normal heart sounds.   No murmur heard. Pulmonary/Chest: Effort normal and breath sounds normal. No respiratory distress. She has no wheezes. She has no rales. She exhibits no tenderness.  Musculoskeletal: She exhibits no edema.  Neurological: She is alert and oriented to person, place, and time.  Psychiatric: She has a normal mood and affect. Her behavior is normal. Judgment and thought content normal.  Nursing note and vitals reviewed.  BP 145/86 mmHg  Pulse 66  Temp(Src) 99.1 F (37.3 C) (Oral)  Ht 5\' 6"  (1.676 m)  Wt 209 lb 12.8 oz (95.165 kg)  BMI 33.88 kg/m2  SpO2 98%  LMP 08/13/2014 Wt Readings from Last 3 Encounters:  08/12/15 209 lb 12.8 oz (95.165 kg)  10/02/14 217 lb 3.2 oz (98.521 kg)  09/13/14 219 lb 3.2 oz (99.428 kg)     Lab Results  Component Value Date   WBC 13.1* 08/31/2014   HGB 10.6* 08/31/2014   HCT 33.3* 08/31/2014   PLT 407* 08/31/2014   GLUCOSE 154* 08/31/2014   CHOL 169 08/12/2012   TRIG 152* 08/12/2012   HDL 40 08/12/2012   LDLCALC 99 08/12/2012   ALT 11 08/12/2012   AST 12 08/12/2012   NA 139 08/31/2014   K 3.6 08/31/2014   CL 106 08/31/2014   CREATININE 0.87 08/31/2014   BUN 8 08/31/2014   CO2 26 08/31/2014  TSH 2.047 08/12/2012    No results found.   Assessment & Plan:  Plan I have discontinued Ms. Knoff's Potassium Chloride ER and atenolol. I am also having her start on metoprolol succinate and potassium chloride. Additionally, I am having her maintain her LORazepam, furosemide, and amLODipine.  Meds ordered this encounter  Medications  . LORazepam (ATIVAN) 1 MG tablet    Sig: Take 1 tablet (1 mg total) by mouth at bedtime.     Dispense:  30 tablet    Refill:  0  . metoprolol succinate (TOPROL-XL) 50 MG 24 hr tablet    Sig: Take 1 tablet (50 mg total) by mouth daily. Take with or immediately following a meal.    Dispense:  90 tablet    Refill:  3  . potassium chloride (K-DUR) 10 MEQ tablet    Sig: Take 1 tablet (10 mEq total) by mouth daily.    Dispense:  90 tablet    Refill:  1  . furosemide (LASIX) 20 MG tablet    Sig: Take 1 tablet (20 mg total) by mouth daily.    Dispense:  30 tablet    Refill:  1  . amLODipine (NORVASC) 10 MG tablet    Sig: Take 1 tablet (10 mg total) by mouth daily.    Dispense:  90 tablet    Refill:  3    Problem List Items Addressed This Visit      Unprioritized   Anxiety    Stable con't ativan prn      Relevant Medications   LORazepam (ATIVAN) 1 MG tablet   HTN (hypertension) - Primary    Slightly elevated but pt is forgetting second dose of bp meds Change atenolol bid to toprol qd Cont norvasc rto 2-3 weeks      Relevant Medications   metoprolol succinate (TOPROL-XL) 50 MG 24 hr tablet   furosemide (LASIX) 20 MG tablet   amLODipine (NORVASC) 10 MG tablet   Other Relevant Orders   POCT urinalysis dipstick   Microalbumin / creatinine urine ratio   Lipid panel   Comprehensive metabolic panel   Hypokalemia    Check labs and con't supplement for now      Relevant Medications   potassium chloride (K-DUR) 10 MEQ tablet    Other Visit Diagnoses    Generalized anxiety disorder        Relevant Medications    LORazepam (ATIVAN) 1 MG tablet       Follow-up: Return in about 2 weeks (around 08/26/2015), or if symptoms worsen or fail to improve, for hypertension.  Ann Held, DO

## 2015-08-12 NOTE — Progress Notes (Signed)
Pre visit review using our clinic review tool, if applicable. No additional management support is needed unless otherwise documented below in the visit note. 

## 2015-08-12 NOTE — Assessment & Plan Note (Addendum)
Slightly elevated but pt is forgetting second dose of bp meds Change atenolol bid to toprol qd Cont norvasc rto 2-3 weeks

## 2015-08-12 NOTE — Patient Instructions (Signed)
Hypertension Hypertension, commonly called high blood pressure, is when the force of blood pumping through your arteries is too strong. Your arteries are the blood vessels that carry blood from your heart throughout your body. A blood pressure reading consists of a higher number over a lower number, such as 110/72. The higher number (systolic) is the pressure inside your arteries when your heart pumps. The lower number (diastolic) is the pressure inside your arteries when your heart relaxes. Ideally you want your blood pressure below 120/80. Hypertension forces your heart to work harder to pump blood. Your arteries may become narrow or stiff. Having untreated or uncontrolled hypertension can cause heart attack, stroke, kidney disease, and other problems. RISK FACTORS Some risk factors for high blood pressure are controllable. Others are not.  Risk factors you cannot control include:   Race. You may be at higher risk if you are African American.  Age. Risk increases with age.  Gender. Men are at higher risk than women before age 45 years. After age 65, women are at higher risk than men. Risk factors you can control include:  Not getting enough exercise or physical activity.  Being overweight.  Getting too much fat, sugar, calories, or salt in your diet.  Drinking too much alcohol. SIGNS AND SYMPTOMS Hypertension does not usually cause signs or symptoms. Extremely high blood pressure (hypertensive crisis) may cause headache, anxiety, shortness of breath, and nosebleed. DIAGNOSIS To check if you have hypertension, your health care provider will measure your blood pressure while you are seated, with your arm held at the level of your heart. It should be measured at least twice using the same arm. Certain conditions can cause a difference in blood pressure between your right and left arms. A blood pressure reading that is higher than normal on one occasion does not mean that you need treatment. If  it is not clear whether you have high blood pressure, you may be asked to return on a different day to have your blood pressure checked again. Or, you may be asked to monitor your blood pressure at home for 1 or more weeks. TREATMENT Treating high blood pressure includes making lifestyle changes and possibly taking medicine. Living a healthy lifestyle can help lower high blood pressure. You may need to change some of your habits. Lifestyle changes may include:  Following the DASH diet. This diet is high in fruits, vegetables, and whole grains. It is low in salt, red meat, and added sugars.  Keep your sodium intake below 2,300 mg per day.  Getting at least 30-45 minutes of aerobic exercise at least 4 times per week.  Losing weight if necessary.  Not smoking.  Limiting alcoholic beverages.  Learning ways to reduce stress. Your health care provider may prescribe medicine if lifestyle changes are not enough to get your blood pressure under control, and if one of the following is true:  You are 18-59 years of age and your systolic blood pressure is above 140.  You are 60 years of age or older, and your systolic blood pressure is above 150.  Your diastolic blood pressure is above 90.  You have diabetes, and your systolic blood pressure is over 140 or your diastolic blood pressure is over 90.  You have kidney disease and your blood pressure is above 140/90.  You have heart disease and your blood pressure is above 140/90. Your personal target blood pressure may vary depending on your medical conditions, your age, and other factors. HOME CARE INSTRUCTIONS    Have your blood pressure rechecked as directed by your health care provider.   Take medicines only as directed by your health care provider. Follow the directions carefully. Blood pressure medicines must be taken as prescribed. The medicine does not work as well when you skip doses. Skipping doses also puts you at risk for  problems.  Do not smoke.   Monitor your blood pressure at home as directed by your health care provider. SEEK MEDICAL CARE IF:   You think you are having a reaction to medicines taken.  You have recurrent headaches or feel dizzy.  You have swelling in your ankles.  You have trouble with your vision. SEEK IMMEDIATE MEDICAL CARE IF:  You develop a severe headache or confusion.  You have unusual weakness, numbness, or feel faint.  You have severe chest or abdominal pain.  You vomit repeatedly.  You have trouble breathing. MAKE SURE YOU:   Understand these instructions.  Will watch your condition.  Will get help right away if you are not doing well or get worse.   This information is not intended to replace advice given to you by your health care provider. Make sure you discuss any questions you have with your health care provider.   Document Released: 01/12/2005 Document Revised: 05/29/2014 Document Reviewed: 11/04/2012 Elsevier Interactive Patient Education 2016 Elsevier Inc.  

## 2015-08-15 DIAGNOSIS — E876 Hypokalemia: Secondary | ICD-10-CM | POA: Insufficient documentation

## 2015-08-15 NOTE — Addendum Note (Signed)
Addended by: Roma Schanz R on: 08/15/2015 01:04 PM   Modules accepted: Miquel Dunn

## 2015-08-15 NOTE — Assessment & Plan Note (Signed)
Stable con't ativan prn

## 2015-08-15 NOTE — Assessment & Plan Note (Signed)
Check labs and con't supplement for now

## 2015-08-27 ENCOUNTER — Ambulatory Visit: Payer: BC Managed Care – PPO | Admitting: Family Medicine

## 2015-12-10 ENCOUNTER — Other Ambulatory Visit: Payer: Self-pay | Admitting: Family Medicine

## 2015-12-10 ENCOUNTER — Other Ambulatory Visit: Payer: Self-pay

## 2015-12-10 DIAGNOSIS — F411 Generalized anxiety disorder: Secondary | ICD-10-CM

## 2015-12-10 MED ORDER — LORAZEPAM 1 MG PO TABS: 1.0000 mg | ORAL_TABLET | Freq: Every day | ORAL | 0 refills | Status: DC

## 2015-12-10 NOTE — Telephone Encounter (Signed)
See refill encounter from 12/10/15.

## 2015-12-10 NOTE — Telephone Encounter (Signed)
Last seen 08/12/15 Last filled #[id number] rf 08/12/15 Sig  Take 1 tablet (1 mg total) by mouth at bedtime.        Please advise PC

## 2015-12-10 NOTE — Telephone Encounter (Signed)
Pharmacy is requesting a refill of patient's LORazepam (ATIVAN) 1 MG tablet. Please advise.    Pharmacy:  [facility], [place] [address]

## 2015-12-11 MED FILL — LORazepam 1 MG TABS: 1 | 30 days supply | Qty: 30 | Fill #0

## 2015-12-12 MED ORDER — LORAZEPAM 1 MG PO TABS: 1.0000 mg | ORAL_TABLET | Freq: Every day | ORAL | 0 refills | Status: DC

## 2015-12-12 MED FILL — FUROSEMIDE 20 MG TABLET: 20 | 30 days supply | Qty: 30 | Fill #1

## 2015-12-12 MED FILL — METOPROLOL SUCC ER 50 MG TA: 50 | 90 days supply | Qty: 90 | Fill #1

## 2015-12-12 MED FILL — AMLODIPINE BESYLATE 10 MG T: 10 | 90 days supply | Qty: 90 | Fill #1

## 2015-12-12 NOTE — Telephone Encounter (Signed)
Rx faxed [facility]

## 2015-12-12 NOTE — Addendum Note (Signed)
Addended by: Magdalene Molly A on: 12/12/2015 02:11 PM   Modules accepted: Orders

## 2016-05-01 ENCOUNTER — Ambulatory Visit (INDEPENDENT_AMBULATORY_CARE_PROVIDER_SITE_OTHER): Payer: BC Managed Care – PPO | Admitting: Family Medicine

## 2016-05-01 ENCOUNTER — Encounter: Payer: Self-pay | Admitting: Family Medicine

## 2016-05-01 VITALS — BP 148/91 | HR 79 | Temp 98.4°F | Resp 16 | Ht 66.0 in | Wt 219.0 lb

## 2016-05-01 DIAGNOSIS — F329 Major depressive disorder, single episode, unspecified: Secondary | ICD-10-CM | POA: Diagnosis not present

## 2016-05-01 DIAGNOSIS — F411 Generalized anxiety disorder: Secondary | ICD-10-CM | POA: Diagnosis not present

## 2016-05-01 DIAGNOSIS — D500 Iron deficiency anemia secondary to blood loss (chronic): Secondary | ICD-10-CM | POA: Diagnosis not present

## 2016-05-01 DIAGNOSIS — R739 Hyperglycemia, unspecified: Secondary | ICD-10-CM | POA: Diagnosis not present

## 2016-05-01 DIAGNOSIS — E876 Hypokalemia: Secondary | ICD-10-CM

## 2016-05-01 DIAGNOSIS — E559 Vitamin D deficiency, unspecified: Secondary | ICD-10-CM | POA: Diagnosis not present

## 2016-05-01 DIAGNOSIS — F32A Depression, unspecified: Secondary | ICD-10-CM

## 2016-05-01 DIAGNOSIS — Z23 Encounter for immunization: Secondary | ICD-10-CM | POA: Diagnosis not present

## 2016-05-01 DIAGNOSIS — I10 Essential (primary) hypertension: Secondary | ICD-10-CM | POA: Diagnosis not present

## 2016-05-01 LAB — COMPREHENSIVE METABOLIC PANEL
ALT: 11 U/L (ref 0–35)
AST: 13 U/L (ref 0–37)
Albumin: 4.3 g/dL (ref 3.5–5.2)
Alkaline Phosphatase: 46 U/L (ref 39–117)
BUN: 9 mg/dL (ref 6–23)
CO2: 31 mEq/L (ref 19–32)
Calcium: 9.4 mg/dL (ref 8.4–10.5)
Chloride: 102 mEq/L (ref 96–112)
Creatinine, Ser: 0.65 mg/dL (ref 0.40–1.20)
GFR: 126.67 mL/min (ref >60.00)
Glucose, Bld: 91 mg/dL (ref 70–99)
Potassium: 3.4 mEq/L — ABNORMAL LOW (ref 3.5–5.1)
Sodium: 138 mEq/L (ref 135–145)
Total Bilirubin: 0.5 mg/dL (ref 0.2–1.2)
Total Protein: 7.5 g/dL (ref 6.0–8.3)

## 2016-05-01 LAB — CBC WITH DIFFERENTIAL/PLATELET
Basophils Absolute: 0.1 10*3/uL (ref 0.0–0.1)
Basophils Relative: 0.8 % (ref 0.0–3.0)
Eosinophils Absolute: 0.1 10*3/uL (ref 0.0–0.7)
Eosinophils Relative: 1.1 % (ref 0.0–5.0)
HCT: 41.9 % (ref 36.0–46.0)
Hemoglobin: 13.8 g/dL (ref 12.0–15.0)
Lymphocytes Relative: 43.4 % (ref 12.0–46.0)
Lymphs Abs: 3.7 10*3/uL (ref 0.7–4.0)
MCHC: 32.9 g/dL (ref 30.0–36.0)
MCV: 95 fl (ref 78.0–100.0)
Monocytes Absolute: 0.4 10*3/uL (ref 0.1–1.0)
Monocytes Relative: 4.5 % (ref 3.0–12.0)
Neutro Abs: 4.3 10*3/uL (ref 1.4–7.7)
Neutrophils Relative %: 50.2 % (ref 43.0–77.0)
Platelets: 458 10*3/uL — ABNORMAL HIGH (ref 150.0–400.0)
RBC: 4.41 Mil/uL (ref 3.87–5.11)
RDW: 14.2 % (ref 11.5–15.5)
WBC: 8.6 10*3/uL (ref 4.0–10.5)

## 2016-05-01 LAB — POC URINALSYSI DIPSTICK (AUTOMATED)
Bilirubin, UA: NEGATIVE
Blood, UA: NEGATIVE
Glucose, UA: NEGATIVE
Ketones, UA: NEGATIVE
Leukocytes, UA: NEGATIVE
Nitrite, UA: NEGATIVE
Protein, UA: NEGATIVE
Spec Grav, UA: 1.02 (ref 1.030–1.035)
Urobilinogen, UA: NEGATIVE (ref ?–2.0)
pH, UA: 6 (ref 5.0–8.0)

## 2016-05-01 LAB — VITAMIN D 25 HYDROXY (VIT D DEFICIENCY, FRACTURES): VITD: 8.48 ng/mL — ABNORMAL LOW (ref 30.00–100.00)

## 2016-05-01 LAB — LIPID PANEL
Cholesterol: 184 mg/dL (ref 0–200)
HDL: 43.9 mg/dL (ref >39.00)
LDL Cholesterol: 113 mg/dL — ABNORMAL HIGH (ref 0–99)
NonHDL: 139.94
Total CHOL/HDL Ratio: 4
Triglycerides: 133 mg/dL (ref 0.0–149.0)
VLDL: 26.6 mg/dL (ref 0.0–40.0)

## 2016-05-01 LAB — TSH: TSH: 1.63 u[IU]/mL (ref 0.35–4.50)

## 2016-05-01 MED ORDER — POTASSIUM CHLORIDE ER 10 MEQ PO TBCR: 10.0000 meq | EXTENDED_RELEASE_TABLET | Freq: Every day | ORAL | 1 refills | Status: DC

## 2016-05-01 MED ORDER — FUROSEMIDE 20 MG PO TABS: 20.0000 mg | ORAL_TABLET | Freq: Every day | ORAL | 1 refills | Status: DC

## 2016-05-01 MED ORDER — METOPROLOL SUCCINATE ER 100 MG PO TB24: 100.0000 mg | ORAL_TABLET | Freq: Every day | ORAL | 2 refills | Status: DC

## 2016-05-01 MED ORDER — AMLODIPINE BESYLATE 10 MG PO TABS: 10.0000 mg | ORAL_TABLET | Freq: Every day | ORAL | 1 refills | Status: DC

## 2016-05-01 MED ORDER — ESCITALOPRAM OXALATE 10 MG PO TABS: 10.0000 mg | ORAL_TABLET | Freq: Every day | ORAL | 2 refills | Status: DC

## 2016-05-01 MED ORDER — LORAZEPAM 1 MG PO TABS: 1.0000 mg | ORAL_TABLET | Freq: Every day | ORAL | 0 refills | Status: DC

## 2016-05-01 MED FILL — POTASSIUM CL 10 MEQ TAB SA: 10 | 90 days supply | Qty: 90 | Fill #0

## 2016-05-01 MED FILL — AMLODIPINE BESYLATE 10 MG T: 10 | 90 days supply | Qty: 90 | Fill #0

## 2016-05-01 MED FILL — METOPROLOL SUCC ER 100 MG T: 100 | 30 days supply | Qty: 30 | Fill #0

## 2016-05-01 MED FILL — FUROSEMIDE 20 MG TABLET: 20 | 90 days supply | Qty: 90 | Fill #0

## 2016-05-01 MED FILL — ESCITALOPRAM 10 MG TABLET: 10 | 30 days supply | Qty: 30 | Fill #0

## 2016-05-01 NOTE — Patient Instructions (Signed)

## 2016-05-01 NOTE — Progress Notes (Signed)
Pre visit review using our clinic review tool, if applicable. No additional management support is needed unless otherwise documented below in the visit note. 

## 2016-05-01 NOTE — Progress Notes (Signed)
Patient ID: [name], female   DOB: 07-02-1970, 46 y.o.   MRN: [id number]     Subjective:  I acted as a Education administrator for Dr. Carollee Herter.  Guerry Bruin, [place]   Patient ID: [name], female    DOB: [birth year], 46 y.o.   MRN: [id number]  Chief Complaint  Patient presents with  . Hypertension  . Anxiety    HPI  Patient is in today for follow up blood pressure and anxiety.  Also she would like to get 3rd hep B.  She has had some depression and has not been taking care of herself.  She teaches ninth grade.  Has not been taking blood pressure medications consistantly until the past month.  She has had her blood pressure checked at work and it ranges  125-140 over 85-90.    Patient Care Team: Ann Held, DO as PCP - General (Family Medicine)   Past Medical History:  Diagnosis Date  . Anemia   . Arthritis   . Fibroids    uterine  . Hypertension   . Insulin resistance   . Menorrhagia   . Umbilical hernia     Past Surgical History:  Procedure Laterality Date  . CESAREAN SECTION    . CRYOTHERAPY  1989  . HYSTEROSCOPY W/D&C  06-20-2008  . [place] ABLATION  06-20-2008  . ROBOTIC ASSISTED TOTAL HYSTERECTOMY N/A 08/30/2014   Procedure: ROBOTIC ASSISTED ATTEMPTED, THEN CONVERSION TO OPEN TOTAL ABDOMINAL HYSTERECTOMY WITH BILATERAL SALPINGECTOMY;  Surgeon: Servando Salina, MD;  Location: [facility];  Service: Gynecology;  Laterality: N/A;  . TMJ ARTHROPLASTY  1998   has screws in both side of jaw per pt  . tubal ligation  06-20-2008  . UMBILICAL HERNIA REPAIR N/A 08/30/2014   Procedure: HERNIA REPAIR UMBILICAL ADULT;  Surgeon: Autumn Messing III, MD;  Location: [facility];  Service: General;  Laterality: N/A;    Family History  Problem Relation Age of Onset  . Breast cancer Mother   . Hypertension Mother   . Diabetes Mother   . Cancer Mother     breast  . Breast cancer Maternal Aunt   . Cancer Maternal Aunt     beast  . Hypertension Father     Social History   Social History  .  Marital status: Divorced    Spouse name: N/A  . Number of children: N/A  . Years of education: N/A   Occupational History  . Not on file.   Social History Main Topics  . Smoking status: Never Smoker  . Smokeless tobacco: Never Used  . Alcohol use No  . Drug use: No  . Sexual activity: Yes    Birth control/ protection: Surgical   Other Topics Concern  . Not on file   Social History Narrative  . No narrative on file    Outpatient Medications Prior to Visit  Medication Sig Dispense Refill  . amLODipine (NORVASC) 10 MG tablet Take 1 tablet (10 mg total) by mouth daily. 90 tablet 3  . furosemide (LASIX) 20 MG tablet Take 1 tablet (20 mg total) by mouth daily. 30 tablet 1  . LORazepam (ATIVAN) 1 MG tablet Take 1 tablet (1 mg total) by mouth at bedtime. 30 tablet 0  . metoprolol succinate (TOPROL-XL) 50 MG 24 hr tablet Take 1 tablet (50 mg total) by mouth daily. Take with or immediately following a meal. 90 tablet 3  . potassium chloride (K-DUR) 10 MEQ tablet Take 1 tablet (10 mEq total) by mouth  daily. 90 tablet 1  . LORazepam (ATIVAN) 1 MG tablet Take 1 tablet (1 mg total) by mouth at bedtime. 30 tablet 0   No facility-administered medications prior to visit.     Allergies  Allergen Reactions  . Lisinopril Swelling and Other (See Comments)    Cough     Review of Systems  Constitutional: Negative for fever and malaise/fatigue.  HENT: Negative for congestion.   Eyes: Negative for blurred vision.  Respiratory: Negative for cough and shortness of breath.   Cardiovascular: Negative for chest pain, palpitations and leg swelling.  Gastrointestinal: Negative for vomiting.  Musculoskeletal: Negative for back pain.  Skin: Negative for rash.  Neurological: Negative for loss of consciousness and headaches.       Objective:    Physical Exam  Constitutional: She is oriented to person, place, and time. She appears well-developed and well-nourished. No distress.  HENT:  Head:  Normocephalic and atraumatic.  Eyes: Conjunctivae are normal.  Neck: Normal range of motion. No thyromegaly present.  Cardiovascular: Normal rate and regular rhythm.   Pulmonary/Chest: Effort normal and breath sounds normal. She has no wheezes.  Abdominal: Soft. Bowel sounds are normal. There is no tenderness.  Musculoskeletal: Normal range of motion. She exhibits no edema or deformity.  Neurological: She is alert and oriented to person, place, and time.  Skin: Skin is warm and dry. She is not diaphoretic.  Psychiatric: She has a normal mood and affect.    BP (!) 148/91   Pulse 79   Temp 98.4 F (36.9 C) (Oral)   Resp 16   Ht 5\' 6"  (1.676 m)   Wt 219 lb (99.3 kg)   LMP 08/17/2014 (Exact Date)   SpO2 98%   BMI 35.35 kg/m  Wt Readings from Last 3 Encounters:  05/01/16 219 lb (99.3 kg)  08/12/15 209 lb 12.8 oz (95.2 kg)  10/02/14 217 lb 3.2 oz (98.5 kg)   BP Readings from Last 3 Encounters:  05/01/16 (!) 148/91  08/12/15 (!) 145/86  10/02/14 (!) 132/96     Immunization History  Administered Date(s) Administered  . Hepatitis B, adult 09/06/2013, 10/11/2013, 05/01/2016  . MMRV 09/12/2013  . Tdap 09/06/2013    Health Maintenance  Topic Date Due  . HIV Screening  03/03/1986  . INFLUENZA VACCINE  08/26/2016  . PAP SMEAR  05/26/2017  . TETANUS/TDAP  09/07/2023    Lab Results  Component Value Date   WBC 8.6 05/01/2016   HGB 13.8 05/01/2016   HCT 41.9 05/01/2016   PLT 458.0 (H) 05/01/2016   GLUCOSE 91 05/01/2016   CHOL 184 05/01/2016   TRIG 133.0 05/01/2016   HDL 43.90 05/01/2016   LDLCALC 113 (H) 05/01/2016   ALT 11 05/01/2016   AST 13 05/01/2016   NA 138 05/01/2016   K 3.4 (L) 05/01/2016   CL 102 05/01/2016   CREATININE 0.65 05/01/2016   BUN 9 05/01/2016   CO2 31 05/01/2016   TSH 1.63 05/01/2016   MICROALBUR 0.6 05/01/2016    Lab Results  Component Value Date   TSH 1.63 05/01/2016   Lab Results  Component Value Date   WBC 8.6 05/01/2016   HGB 13.8  05/01/2016   HCT 41.9 05/01/2016   MCV 95.0 05/01/2016   PLT 458.0 (H) 05/01/2016   Lab Results  Component Value Date   NA 138 05/01/2016   K 3.4 (L) 05/01/2016   CO2 31 05/01/2016   GLUCOSE 91 05/01/2016   BUN 9 05/01/2016   CREATININE  0.65 05/01/2016   BILITOT 0.5 05/01/2016   ALKPHOS 46 05/01/2016   AST 13 05/01/2016   ALT 11 05/01/2016   PROT 7.5 05/01/2016   ALBUMIN 4.3 05/01/2016   CALCIUM 9.4 05/01/2016   ANIONGAP 7 08/31/2014   GFR 126.67 05/01/2016   Lab Results  Component Value Date   CHOL 184 05/01/2016   Lab Results  Component Value Date   HDL 43.90 05/01/2016   Lab Results  Component Value Date   LDLCALC 113 (H) 05/01/2016   Lab Results  Component Value Date   TRIG 133.0 05/01/2016   Lab Results  Component Value Date   CHOLHDL 4 05/01/2016   No results found for: HGBA1C       Assessment & Plan:   Problem List Items Addressed This Visit      Unprioritized   Anemia   Relevant Orders   CBC with Differential/Platelet (Completed)   Vitamin D deficiency   Relevant Orders   VITAMIN D 25 Hydroxy (Vit-D Deficiency, Fractures) (Completed)   HTN (hypertension) - Primary   Relevant Medications   amLODipine (NORVASC) 10 MG tablet   furosemide (LASIX) 20 MG tablet   metoprolol succinate (TOPROL XL) 100 MG 24 hr tablet   Other Relevant Orders   POCT Urinalysis Dipstick (Automated) (Completed)   Comprehensive metabolic panel (Completed)   Lipid panel (Completed)   Microalbumin / creatinine urine ratio (Completed)   Hypokalemia   Relevant Medications   potassium chloride (K-DUR) 10 MEQ tablet   Other Relevant Orders   Microalbumin / creatinine urine ratio (Completed)    Other Visit Diagnoses    Generalized anxiety disorder       Relevant Medications   LORazepam (ATIVAN) 1 MG tablet   Other Relevant Orders   Microalbumin / creatinine urine ratio (Completed)   TSH (Completed)   Need for hepatitis B vaccination       Relevant Orders    Hepatitis B vaccine adult IM (Completed)   Hyperglycemia       Depression, unspecified depression type       Relevant Medications   LORazepam (ATIVAN) 1 MG tablet   escitalopram (LEXAPRO) 10 MG tablet      I have discontinued Ms. Forcier's metoprolol succinate. I am also having her start on metoprolol succinate and escitalopram. Additionally, I am having her maintain her LORazepam, potassium chloride, amLODipine, and furosemide.  Meds ordered this encounter  Medications  . LORazepam (ATIVAN) 1 MG tablet    Sig: Take 1 tablet (1 mg total) by mouth at bedtime.    Dispense:  30 tablet    Refill:  0  . potassium chloride (K-DUR) 10 MEQ tablet    Sig: Take 1 tablet (10 mEq total) by mouth daily.    Dispense:  90 tablet    Refill:  1  . amLODipine (NORVASC) 10 MG tablet    Sig: Take 1 tablet (10 mg total) by mouth daily.    Dispense:  90 tablet    Refill:  1  . furosemide (LASIX) 20 MG tablet    Sig: Take 1 tablet (20 mg total) by mouth daily.    Dispense:  90 tablet    Refill:  1  . metoprolol succinate (TOPROL XL) 100 MG 24 hr tablet    Sig: Take 1 tablet (100 mg total) by mouth daily. Take with or immediately following a meal.    Dispense:  30 tablet    Refill:  2  . escitalopram (LEXAPRO) 10 MG tablet  Sig: Take 1 tablet (10 mg total) by mouth daily.    Dispense:  30 tablet    Refill:  2    CMA served as scribe during this visit. History, Physical and Plan performed by medical provider. Documentation and orders reviewed and attested to.  Ann Held, DO

## 2016-05-02 LAB — MICROALBUMIN / CREATININE URINE RATIO
Creatinine, Urine: 119 mg/dL (ref 20–320)
Microalb Creat Ratio: 5 mcg/mg creat (ref <30)
Microalb, Ur: 0.6 mg/dL

## 2016-05-04 ENCOUNTER — Other Ambulatory Visit: Payer: Self-pay | Admitting: Family Medicine

## 2016-05-04 DIAGNOSIS — E559 Vitamin D deficiency, unspecified: Secondary | ICD-10-CM

## 2016-05-04 MED ORDER — VITAMIN D (ERGOCALCIFEROL) 1.25 MG (50000 UNIT) PO CAPS: 50000.0000 [IU] | ORAL_CAPSULE | ORAL | 4 refills | Status: DC

## 2016-05-04 MED FILL — VIT D2 1.25 MG (50,000 UNIT: 1.25 MG | 28 days supply | Qty: 4 | Fill #0

## 2016-06-05 ENCOUNTER — Ambulatory Visit: Payer: BC Managed Care – PPO | Admitting: Family Medicine

## 2016-06-18 ENCOUNTER — Ambulatory Visit: Payer: BC Managed Care – PPO | Admitting: Family Medicine

## 2016-06-25 ENCOUNTER — Telehealth: Payer: Self-pay | Admitting: Family Medicine

## 2016-06-25 ENCOUNTER — Ambulatory Visit: Payer: BC Managed Care – PPO | Admitting: Family Medicine

## 2016-06-25 NOTE — Telephone Encounter (Signed)
Patient lvm at 9:05am cancelling 4:15pm appointment due to school conflict patient will call back to [facility], charge or no charge

## 2016-06-25 NOTE — Telephone Encounter (Signed)
No charge. 

## 2016-08-13 ENCOUNTER — Encounter: Payer: Self-pay | Admitting: Family Medicine

## 2016-08-13 ENCOUNTER — Ambulatory Visit (INDEPENDENT_AMBULATORY_CARE_PROVIDER_SITE_OTHER): Payer: BC Managed Care – PPO | Admitting: Family Medicine

## 2016-08-13 VITALS — BP 149/100 | HR 84 | Temp 98.6°F | Ht 67.0 in | Wt 225.0 lb

## 2016-08-13 DIAGNOSIS — E559 Vitamin D deficiency, unspecified: Secondary | ICD-10-CM

## 2016-08-13 DIAGNOSIS — F411 Generalized anxiety disorder: Secondary | ICD-10-CM | POA: Diagnosis not present

## 2016-08-13 DIAGNOSIS — E876 Hypokalemia: Secondary | ICD-10-CM

## 2016-08-13 DIAGNOSIS — F329 Major depressive disorder, single episode, unspecified: Secondary | ICD-10-CM | POA: Diagnosis not present

## 2016-08-13 DIAGNOSIS — I10 Essential (primary) hypertension: Secondary | ICD-10-CM | POA: Diagnosis not present

## 2016-08-13 DIAGNOSIS — F32A Depression, unspecified: Secondary | ICD-10-CM

## 2016-08-13 MED ORDER — METOPROLOL SUCCINATE ER 50 MG PO TB24: ORAL_TABLET | ORAL | 2 refills | Status: DC

## 2016-08-13 MED ORDER — POTASSIUM CHLORIDE ER 10 MEQ PO TBCR: 10.0000 meq | EXTENDED_RELEASE_TABLET | Freq: Every day | ORAL | 1 refills | Status: DC

## 2016-08-13 MED ORDER — AMLODIPINE BESYLATE 10 MG PO TABS: 10.0000 mg | ORAL_TABLET | Freq: Every day | ORAL | 1 refills | Status: DC

## 2016-08-13 MED ORDER — ESCITALOPRAM OXALATE 10 MG PO TABS: 10.0000 mg | ORAL_TABLET | Freq: Every day | ORAL | 2 refills | Status: DC

## 2016-08-13 MED ORDER — VITAMIN D (ERGOCALCIFEROL) 1.25 MG (50000 UNIT) PO CAPS: 50000.0000 [IU] | ORAL_CAPSULE | ORAL | 4 refills | Status: DC

## 2016-08-13 MED ORDER — FUROSEMIDE 20 MG PO TABS: 20.0000 mg | ORAL_TABLET | Freq: Every day | ORAL | 1 refills | Status: DC

## 2016-08-13 MED ORDER — LORAZEPAM 1 MG PO TABS: 1.0000 mg | ORAL_TABLET | Freq: Every day | ORAL | 0 refills | Status: DC

## 2016-08-13 MED FILL — POTASSIUM CL 10 MEQ TAB SA: 10 | 90 days supply | Qty: 90 | Fill #0

## 2016-08-13 MED FILL — LORazepam 1 MG TABS: 1 | 30 days supply | Qty: 30 | Fill #0

## 2016-08-13 MED FILL — FUROSEMIDE 20 MG TAB: 20 | 90 days supply | Qty: 90 | Fill #0

## 2016-08-13 MED FILL — VIT D2 1.25 MG (50,000 UNIT: 1.25 MG | 28 days supply | Qty: 4 | Fill #0

## 2016-08-13 MED FILL — ESCITALOPRAM 10 MG TABLET: 10 | 30 days supply | Qty: 30 | Fill #0

## 2016-08-13 MED FILL — METOPROLOL SUCC ER 50 MG TA: 50 | 30 days supply | Qty: 90 | Fill #0

## 2016-08-13 MED FILL — AMLODIPINE BESYLATE 10 MG T: 10 | 90 days supply | Qty: 90 | Fill #0

## 2016-08-13 NOTE — Progress Notes (Signed)
Patient ID: [name], female    DOB: [birth year]  Age: 46 y.o. MRN: [id number]    Subjective:  Subjective  HPI [name] presents for f/u bp--- she missed her med yesterday.  Not headaches, no cp or sob.     Review of Systems  Constitutional: Negative for appetite change, diaphoresis, fatigue and unexpected weight change.  Eyes: Negative for pain, redness and visual disturbance.  Respiratory: Negative for cough, chest tightness, shortness of breath and wheezing.   Cardiovascular: Negative for chest pain, palpitations and leg swelling.  Endocrine: Negative for cold intolerance, heat intolerance, polydipsia, polyphagia and polyuria.  Genitourinary: Negative for difficulty urinating, dysuria and frequency.  Neurological: Negative for dizziness, light-headedness, numbness and headaches.    History Past Medical History:  Diagnosis Date  . Anemia   . Arthritis   . Fibroids    uterine  . Hypertension   . Insulin resistance   . Menorrhagia   . Umbilical hernia     She has a past surgical history that includes Novasure ablation (06-20-2008); Hysteroscopy w/D&C (06-20-2008); tubal ligation (06-20-2008); Cryotherapy (1989); Cesarean section; TMJ Arthroplasty (1998); Robotic assisted total hysterectomy (N/A, 09/28/8099); and Umbilical hernia repair (N/A, 08/30/2014).   Her family history includes Breast cancer in her maternal aunt and mother; Cancer in her maternal aunt and mother; Diabetes in her mother; Hypertension in her father and mother.She reports that she has never smoked. She has never used smokeless tobacco. She reports that she does not drink alcohol or use drugs.  No current outpatient prescriptions on file prior to visit.   No current facility-administered medications on file prior to visit.      Objective:  Objective  Physical Exam  Constitutional: She is oriented to person, place, and time. She appears well-developed and well-nourished.  HENT:  Head: Normocephalic and  atraumatic.  Eyes: Conjunctivae and EOM are normal.  Neck: Normal range of motion. Neck supple. No JVD present. Carotid bruit is not present. No thyromegaly present.  Cardiovascular: Normal rate, regular rhythm and normal heart sounds.   No murmur heard. Pulmonary/Chest: Effort normal and breath sounds normal. No respiratory distress. She has no wheezes. She has no rales. She exhibits no tenderness.  Musculoskeletal: She exhibits no edema.  Neurological: She is alert and oriented to person, place, and time.  Psychiatric: She has a normal mood and affect. Her behavior is normal. Judgment and thought content normal.  Nursing note and vitals reviewed.  BP (!) 149/100 (BP Location: Right Arm, Patient Position: Sitting, Cuff Size: Large)   Pulse 84   Temp 98.6 F (37 C) (Oral)   Ht 5\' 7"  (1.702 m)   Wt 225 lb (102.1 kg)   LMP 08/17/2014 (Exact Date)   SpO2 100%   BMI 35.24 kg/m  Wt Readings from Last 3 Encounters:  08/13/16 225 lb (102.1 kg)  05/01/16 219 lb (99.3 kg)  08/12/15 209 lb 12.8 oz (95.2 kg)     Lab Results  Component Value Date   WBC 8.6 05/01/2016   HGB 13.8 05/01/2016   HCT 41.9 05/01/2016   PLT 458.0 (H) 05/01/2016   GLUCOSE 91 05/01/2016   CHOL 184 05/01/2016   TRIG 133.0 05/01/2016   HDL 43.90 05/01/2016   LDLCALC 113 (H) 05/01/2016   ALT 11 05/01/2016   AST 13 05/01/2016   NA 138 05/01/2016   K 3.4 (L) 05/01/2016   CL 102 05/01/2016   CREATININE 0.65 05/01/2016   BUN 9 05/01/2016   CO2 31  05/01/2016   TSH 1.63 05/01/2016   MICROALBUR 0.6 05/01/2016    No results found.   Assessment & Plan:  Plan  I have discontinued Ms. Ebbs's metoprolol succinate. I am also having her start on metoprolol succinate. Additionally, I am having her maintain her amLODipine, escitalopram, furosemide, LORazepam, potassium chloride, and Vitamin D (Ergocalciferol).  Meds ordered this encounter  Medications  . metoprolol succinate (TOPROL XL) 50 MG 24 hr tablet    Sig:  3 po qd    Dispense:  90 tablet    Refill:  2  . amLODipine (NORVASC) 10 MG tablet    Sig: Take 1 tablet (10 mg total) by mouth daily.    Dispense:  90 tablet    Refill:  1  . escitalopram (LEXAPRO) 10 MG tablet    Sig: Take 1 tablet (10 mg total) by mouth daily.    Dispense:  30 tablet    Refill:  2  . furosemide (LASIX) 20 MG tablet    Sig: Take 1 tablet (20 mg total) by mouth daily.    Dispense:  90 tablet    Refill:  1  . LORazepam (ATIVAN) 1 MG tablet    Sig: Take 1 tablet (1 mg total) by mouth at bedtime.    Dispense:  30 tablet    Refill:  0  . potassium chloride (K-DUR) 10 MEQ tablet    Sig: Take 1 tablet (10 mEq total) by mouth daily.    Dispense:  90 tablet    Refill:  1  . Vitamin D, Ergocalciferol, (DRISDOL) 50000 units CAPS capsule    Sig: Take 1 capsule (50,000 Units total) by mouth every 7 (seven) days.    Dispense:  4 capsule    Refill:  4    Problem List Items Addressed This Visit      Unprioritized   Vitamin D deficiency   Relevant Medications   Vitamin D, Ergocalciferol, (DRISDOL) 50000 units CAPS capsule   Hypokalemia   Relevant Medications   potassium chloride (K-DUR) 10 MEQ tablet   Depression   Relevant Medications   escitalopram (LEXAPRO) 10 MG tablet   LORazepam (ATIVAN) 1 MG tablet   Essential hypertension - Primary    Poorly controlled will alter medications, encouraged DASH diet, minimize caffeine and obtain adequate sleep. Report concerning symptoms and follow up as directed and as needed      Relevant Medications   metoprolol succinate (TOPROL XL) 50 MG 24 hr tablet   amLODipine (NORVASC) 10 MG tablet   furosemide (LASIX) 20 MG tablet   Other Relevant Orders   CBC with Differential/Platelet   Lipid panel   Comprehensive metabolic panel   Generalized anxiety disorder    stable      Relevant Medications   LORazepam (ATIVAN) 1 MG tablet      Follow-up: Return in about 3 weeks (around 09/03/2016) for hypertension.  Ann Held, DO

## 2016-08-13 NOTE — Progress Notes (Signed)
Pre visit review using our clinic review tool, if applicable. No additional management support is needed unless otherwise documented below in the visit note. 

## 2016-08-13 NOTE — Patient Instructions (Signed)
DASH Eating Plan DASH stands for "Dietary Approaches to Stop Hypertension." The DASH eating plan is a healthy eating plan that has been shown to reduce high blood pressure (hypertension). It may also reduce your risk for type 2 diabetes, heart disease, and stroke. The DASH eating plan may also help with weight loss. What are tips for following this plan? General guidelines  Avoid eating more than 2,300 mg (milligrams) of salt (sodium) a day. If you have hypertension, you may need to reduce your sodium intake to 1,500 mg a day.  Limit alcohol intake to no more than 1 drink a day for nonpregnant women and 2 drinks a day for men. One drink equals 12 oz of beer, 5 oz of wine, or 1 oz of hard liquor.  Work with your health care provider to maintain a healthy body weight or to lose weight. Ask what an ideal weight is for you.  Get at least 30 minutes of exercise that causes your heart to beat faster (aerobic exercise) most days of the week. Activities may include walking, swimming, or biking.  Work with your health care provider or diet and nutrition specialist (dietitian) to adjust your eating plan to your individual calorie needs. Reading food labels  Check food labels for the amount of sodium per serving. Choose foods with less than 5 percent of the Daily Value of sodium. Generally, foods with less than 300 mg of sodium per serving fit into this eating plan.  To find whole grains, look for the word "whole" as the first word in the ingredient list. Shopping  Buy products labeled as "low-sodium" or "no salt added."  Buy fresh foods. Avoid canned foods and premade or frozen meals. Cooking  Avoid adding salt when cooking. Use salt-free seasonings or herbs instead of table salt or sea salt. Check with your health care provider or pharmacist before using salt substitutes.  Do not fry foods. Cook foods using healthy methods such as baking, boiling, grilling, and broiling instead.  Cook with  heart-healthy oils, such as olive, canola, soybean, or sunflower oil. Meal planning   Eat a balanced diet that includes: ? 5 or more servings of fruits and vegetables each day. At each meal, try to fill half of your plate with fruits and vegetables. ? Up to 6-8 servings of whole grains each day. ? Less than 6 oz of lean meat, poultry, or fish each day. A 3-oz serving of meat is about the same size as a deck of cards. One egg equals 1 oz. ? 2 servings of low-fat dairy each day. ? A serving of nuts, seeds, or beans 5 times each week. ? Heart-healthy fats. Healthy fats called Omega-3 fatty acids are found in foods such as flaxseeds and coldwater fish, like sardines, salmon, and mackerel.  Limit how much you eat of the following: ? Canned or prepackaged foods. ? Food that is high in trans fat, such as fried foods. ? Food that is high in saturated fat, such as fatty meat. ? Sweets, desserts, sugary drinks, and other foods with added sugar. ? Full-fat dairy products.  Do not salt foods before eating.  Try to eat at least 2 vegetarian meals each week.  Eat more home-cooked food and less restaurant, buffet, and fast food.  When eating at a restaurant, ask that your food be prepared with less salt or no salt, if possible. What foods are recommended? The items listed may not be a complete list. Talk with your dietitian about what   dietary choices are best for you. Grains Whole-grain or whole-wheat bread. Whole-grain or whole-wheat pasta. Brown rice. Oatmeal. Quinoa. Bulgur. Whole-grain and low-sodium cereals. Pita bread. Low-fat, low-sodium crackers. Whole-wheat flour tortillas. Vegetables Fresh or frozen vegetables (raw, steamed, roasted, or grilled). Low-sodium or reduced-sodium tomato and vegetable juice. Low-sodium or reduced-sodium tomato sauce and tomato paste. Low-sodium or reduced-sodium canned vegetables. Fruits All fresh, dried, or frozen fruit. Canned fruit in natural juice (without  added sugar). Meat and other protein foods Skinless chicken or turkey. Ground chicken or turkey. Pork with fat trimmed off. Fish and seafood. Egg whites. Dried beans, peas, or lentils. Unsalted nuts, nut butters, and seeds. Unsalted canned beans. Lean cuts of beef with fat trimmed off. Low-sodium, lean deli meat. Dairy Low-fat (1%) or fat-free (skim) milk. Fat-free, low-fat, or reduced-fat cheeses. Nonfat, low-sodium ricotta or cottage cheese. Low-fat or nonfat yogurt. Low-fat, low-sodium cheese. Fats and oils Soft margarine without trans fats. Vegetable oil. Low-fat, reduced-fat, or light mayonnaise and salad dressings (reduced-sodium). Canola, safflower, olive, soybean, and sunflower oils. Avocado. Seasoning and other foods Herbs. Spices. Seasoning mixes without salt. Unsalted popcorn and pretzels. Fat-free sweets. What foods are not recommended? The items listed may not be a complete list. Talk with your dietitian about what dietary choices are best for you. Grains Baked goods made with fat, such as croissants, muffins, or some breads. Dry pasta or rice meal packs. Vegetables Creamed or fried vegetables. Vegetables in a cheese sauce. Regular canned vegetables (not low-sodium or reduced-sodium). Regular canned tomato sauce and paste (not low-sodium or reduced-sodium). Regular tomato and vegetable juice (not low-sodium or reduced-sodium). Pickles. Olives. Fruits Canned fruit in a light or heavy syrup. Fried fruit. Fruit in cream or butter sauce. Meat and other protein foods Fatty cuts of meat. Ribs. Fried meat. Bacon. Sausage. Bologna and other processed lunch meats. Salami. Fatback. Hotdogs. Bratwurst. Salted nuts and seeds. Canned beans with added salt. Canned or smoked fish. Whole eggs or egg yolks. Chicken or turkey with skin. Dairy Whole or 2% milk, cream, and half-and-half. Whole or full-fat cream cheese. Whole-fat or sweetened yogurt. Full-fat cheese. Nondairy creamers. Whipped toppings.  Processed cheese and cheese spreads. Fats and oils Butter. Stick margarine. Lard. Shortening. Ghee. Bacon fat. Tropical oils, such as coconut, palm kernel, or palm oil. Seasoning and other foods Salted popcorn and pretzels. Onion salt, garlic salt, seasoned salt, table salt, and sea salt. Worcestershire sauce. Tartar sauce. Barbecue sauce. Teriyaki sauce. Soy sauce, including reduced-sodium. Steak sauce. Canned and packaged gravies. Fish sauce. Oyster sauce. Cocktail sauce. Horseradish that you find on the shelf. Ketchup. Mustard. Meat flavorings and tenderizers. Bouillon cubes. Hot sauce and Tabasco sauce. Premade or packaged marinades. Premade or packaged taco seasonings. Relishes. Regular salad dressings. Where to find more information:  National Heart, Lung, and Blood Institute: www.nhlbi.nih.gov  American Heart Association: www.heart.org Summary  The DASH eating plan is a healthy eating plan that has been shown to reduce high blood pressure (hypertension). It may also reduce your risk for type 2 diabetes, heart disease, and stroke.  With the DASH eating plan, you should limit salt (sodium) intake to 2,300 mg a day. If you have hypertension, you may need to reduce your sodium intake to 1,500 mg a day.  When on the DASH eating plan, aim to eat more fresh fruits and vegetables, whole grains, lean proteins, low-fat dairy, and heart-healthy fats.  Work with your health care provider or diet and nutrition specialist (dietitian) to adjust your eating plan to your individual   calorie needs. This information is not intended to replace advice given to you by your health care provider. Make sure you discuss any questions you have with your health care provider. Document Released: 01/01/2011 Document Revised: 01/06/2016 Document Reviewed: 01/06/2016 Elsevier Interactive Patient Education  2017 [employer]  

## 2016-08-16 DIAGNOSIS — F329 Major depressive disorder, single episode, unspecified: Secondary | ICD-10-CM | POA: Insufficient documentation

## 2016-08-16 DIAGNOSIS — F411 Generalized anxiety disorder: Secondary | ICD-10-CM | POA: Insufficient documentation

## 2016-08-16 DIAGNOSIS — F32A Depression, unspecified: Secondary | ICD-10-CM | POA: Insufficient documentation

## 2016-08-16 NOTE — Assessment & Plan Note (Addendum)
Poorly controlled will alter medications, encouraged DASH diet, minimize caffeine and obtain adequate sleep. Report concerning symptoms and follow up as directed and as needed 

## 2016-08-16 NOTE — Assessment & Plan Note (Signed)
stable °

## 2016-09-10 ENCOUNTER — Ambulatory Visit: Payer: BC Managed Care – PPO | Admitting: Family Medicine

## 2016-10-05 ENCOUNTER — Ambulatory Visit (INDEPENDENT_AMBULATORY_CARE_PROVIDER_SITE_OTHER): Payer: BC Managed Care – PPO | Admitting: Family Medicine

## 2016-10-05 ENCOUNTER — Encounter: Payer: Self-pay | Admitting: Family Medicine

## 2016-10-05 VITALS — BP 132/84 | HR 73 | Temp 97.9°F | Ht 67.0 in | Wt 226.6 lb

## 2016-10-05 DIAGNOSIS — I10 Essential (primary) hypertension: Secondary | ICD-10-CM

## 2016-10-05 NOTE — Assessment & Plan Note (Signed)
Well controlled, no changes to meds. Encouraged heart healthy diet such as the DASH diet and exercise as tolerated.  °

## 2016-10-05 NOTE — Progress Notes (Signed)
Patient ID: [name], female    DOB: [birth year]  Age: 46 y.o. MRN: [id number]    Subjective:  Subjective  HPI [name] presents for f/u bp.  Pt is struggling with diet but has an appointment with Dr Leafy Ro coming up.  She is taking black seed oil and apple / grape vinegar juice.  No other new complaints.    Review of Systems  Constitutional: Negative for activity change, appetite change, fatigue and unexpected weight change.  Respiratory: Negative for cough and shortness of breath.   Cardiovascular: Negative for chest pain and palpitations.  Psychiatric/Behavioral: Negative for behavioral problems and dysphoric mood. The patient is not nervous/anxious.     History Past Medical History:  Diagnosis Date  . Anemia   . Arthritis   . Fibroids    uterine  . Hypertension   . Insulin resistance   . Menorrhagia   . Umbilical hernia     She has a past surgical history that includes Novasure ablation (06-20-2008); Hysteroscopy w/D&C (06-20-2008); tubal ligation (06-20-2008); Cryotherapy (1989); Cesarean section; TMJ Arthroplasty (1998); Robotic assisted total hysterectomy (N/A, 05/26/256); and Umbilical hernia repair (N/A, 08/30/2014).   Her family history includes Breast cancer in her maternal aunt and mother; Cancer in her maternal aunt and mother; Diabetes in her mother; Hypertension in her father and mother.She reports that she has never smoked. She has never used smokeless tobacco. She reports that she does not drink alcohol or use drugs.  Current Outpatient Prescriptions on File Prior to Visit  Medication Sig Dispense Refill  . amLODipine (NORVASC) 10 MG tablet Take 1 tablet (10 mg total) by mouth daily. 90 tablet 1  . escitalopram (LEXAPRO) 10 MG tablet Take 1 tablet (10 mg total) by mouth daily. 30 tablet 2  . furosemide (LASIX) 20 MG tablet Take 1 tablet (20 mg total) by mouth daily. 90 tablet 1  . LORazepam (ATIVAN) 1 MG tablet Take 1 tablet (1 mg total) by mouth at bedtime.  30 tablet 0  . metoprolol succinate (TOPROL XL) 50 MG 24 hr tablet 3 po qd 90 tablet 2  . potassium chloride (K-DUR) 10 MEQ tablet Take 1 tablet (10 mEq total) by mouth daily. 90 tablet 1  . Vitamin D, Ergocalciferol, (DRISDOL) 50000 units CAPS capsule Take 1 capsule (50,000 Units total) by mouth every 7 (seven) days. 4 capsule 4   No current facility-administered medications on file prior to visit.      Objective:  Objective  Physical Exam  Constitutional: She is oriented to person, place, and time. She appears well-developed and well-nourished.  HENT:  Head: Normocephalic and atraumatic.  Eyes: Conjunctivae and EOM are normal.  Neck: Normal range of motion. Neck supple. No JVD present. Carotid bruit is not present. No thyromegaly present.  Cardiovascular: Normal rate, regular rhythm and normal heart sounds.   No murmur heard. Pulmonary/Chest: Effort normal and breath sounds normal. No respiratory distress. She has no wheezes. She has no rales. She exhibits no tenderness.  Musculoskeletal: She exhibits no edema.  Neurological: She is alert and oriented to person, place, and time.  Psychiatric: She has a normal mood and affect.  Nursing note and vitals reviewed.  BP 132/84 (BP Location: Left Arm, Patient Position: Sitting, Cuff Size: Large)   Pulse 73   Temp 97.9 F (36.6 C) (Oral)   Ht 5\' 7"  (1.702 m)   Wt 226 lb 9.6 oz (102.8 kg)   LMP 08/17/2014 (Exact Date)   SpO2 99%  BMI 35.49 kg/m  Wt Readings from Last 3 Encounters:  10/05/16 226 lb 9.6 oz (102.8 kg)  08/13/16 225 lb (102.1 kg)  05/01/16 219 lb (99.3 kg)     Lab Results  Component Value Date   WBC 8.6 05/01/2016   HGB 13.8 05/01/2016   HCT 41.9 05/01/2016   PLT 458.0 (H) 05/01/2016   GLUCOSE 91 05/01/2016   CHOL 184 05/01/2016   TRIG 133.0 05/01/2016   HDL 43.90 05/01/2016   LDLCALC 113 (H) 05/01/2016   ALT 11 05/01/2016   AST 13 05/01/2016   NA 138 05/01/2016   K 3.4 (L) 05/01/2016   CL 102 05/01/2016    CREATININE 0.65 05/01/2016   BUN 9 05/01/2016   CO2 31 05/01/2016   TSH 1.63 05/01/2016   MICROALBUR 0.6 05/01/2016    No results found.   Assessment & Plan:  Plan  I am having [name] maintain her metoprolol succinate, amLODipine, escitalopram, furosemide, LORazepam, potassium chloride, and Vitamin D (Ergocalciferol).  No orders of the defined types were placed in this encounter.   Problem List Items Addressed This Visit      Unprioritized   Essential hypertension - Primary    Well controlled, no changes to meds. Encouraged heart healthy diet such as the DASH diet and exercise as tolerated.        Relevant Orders   Lipid panel   Comprehensive metabolic panel   CBC with Differential/Platelet      Follow-up: Return in about 3 months (around 01/04/2017) for hypertension.  Ann Held, DO

## 2016-10-05 NOTE — Patient Instructions (Signed)

## 2016-10-06 LAB — CBC WITH DIFFERENTIAL/PLATELET
Basophils Absolute: 0.1 10*3/uL (ref 0.0–0.1)
Basophils Relative: 1.2 % (ref 0.0–3.0)
Eosinophils Absolute: 0.1 10*3/uL (ref 0.0–0.7)
Eosinophils Relative: 1.5 % (ref 0.0–5.0)
HCT: 41.8 % (ref 36.0–46.0)
Hemoglobin: 13.8 g/dL (ref 12.0–15.0)
Lymphocytes Relative: 43.1 % (ref 12.0–46.0)
Lymphs Abs: 3.8 10*3/uL (ref 0.7–4.0)
MCHC: 33 g/dL (ref 30.0–36.0)
MCV: 95.8 fl (ref 78.0–100.0)
Monocytes Absolute: 0.5 10*3/uL (ref 0.1–1.0)
Monocytes Relative: 6.2 % (ref 3.0–12.0)
Neutro Abs: 4.3 10*3/uL (ref 1.4–7.7)
Neutrophils Relative %: 48 % (ref 43.0–77.0)
Platelets: 449 10*3/uL — ABNORMAL HIGH (ref 150.0–400.0)
RBC: 4.36 Mil/uL (ref 3.87–5.11)
RDW: 13.7 % (ref 11.5–15.5)
WBC: 8.9 10*3/uL (ref 4.0–10.5)

## 2016-10-06 LAB — COMPREHENSIVE METABOLIC PANEL
ALT: 15 U/L (ref 0–35)
AST: 18 U/L (ref 0–37)
Albumin: 4.1 g/dL (ref 3.5–5.2)
Alkaline Phosphatase: 41 U/L (ref 39–117)
BUN: 12 mg/dL (ref 6–23)
CO2: 28 mEq/L (ref 19–32)
Calcium: 9.5 mg/dL (ref 8.4–10.5)
Chloride: 103 mEq/L (ref 96–112)
Creatinine, Ser: 0.64 mg/dL (ref 0.40–1.20)
GFR: 128.71 mL/min (ref >60.00)
Glucose, Bld: 86 mg/dL (ref 70–99)
Potassium: 4.3 mEq/L (ref 3.5–5.1)
Sodium: 137 mEq/L (ref 135–145)
Total Bilirubin: 0.3 mg/dL (ref 0.2–1.2)
Total Protein: 7 g/dL (ref 6.0–8.3)

## 2016-10-06 LAB — LIPID PANEL
Cholesterol: 178 mg/dL (ref 0–200)
HDL: 46.7 mg/dL (ref >39.00)
LDL Cholesterol: 109 mg/dL — ABNORMAL HIGH (ref 0–99)
NonHDL: 131.29
Total CHOL/HDL Ratio: 4
Triglycerides: 109 mg/dL (ref 0.0–149.0)
VLDL: 21.8 mg/dL (ref 0.0–40.0)

## 2016-10-20 ENCOUNTER — Encounter (INDEPENDENT_AMBULATORY_CARE_PROVIDER_SITE_OTHER): Payer: Self-pay

## 2016-11-05 ENCOUNTER — Encounter (INDEPENDENT_AMBULATORY_CARE_PROVIDER_SITE_OTHER): Payer: Self-pay | Admitting: Family Medicine

## 2016-11-05 ENCOUNTER — Ambulatory Visit (INDEPENDENT_AMBULATORY_CARE_PROVIDER_SITE_OTHER): Payer: BC Managed Care – PPO | Admitting: Family Medicine

## 2016-11-05 VITALS — BP 146/99 | HR 89 | Temp 98.3°F | Ht 66.0 in | Wt 230.0 lb

## 2016-11-05 DIAGNOSIS — R0602 Shortness of breath: Secondary | ICD-10-CM | POA: Diagnosis not present

## 2016-11-05 DIAGNOSIS — R739 Hyperglycemia, unspecified: Secondary | ICD-10-CM

## 2016-11-05 DIAGNOSIS — E559 Vitamin D deficiency, unspecified: Secondary | ICD-10-CM

## 2016-11-05 DIAGNOSIS — R5383 Other fatigue: Secondary | ICD-10-CM | POA: Diagnosis not present

## 2016-11-05 DIAGNOSIS — Z1331 Encounter for screening for depression: Secondary | ICD-10-CM

## 2016-11-05 DIAGNOSIS — I10 Essential (primary) hypertension: Secondary | ICD-10-CM | POA: Diagnosis not present

## 2016-11-05 DIAGNOSIS — Z0289 Encounter for other administrative examinations: Secondary | ICD-10-CM

## 2016-11-05 DIAGNOSIS — Z6837 Body mass index (BMI) 37.0-37.9, adult: Secondary | ICD-10-CM | POA: Diagnosis not present

## 2016-11-06 LAB — COMPREHENSIVE METABOLIC PANEL
ALT: 11 IU/L (ref 0–32)
AST: 11 IU/L (ref 0–40)
Albumin/Globulin Ratio: 1.6 (ref 1.2–2.2)
Albumin: 4 g/dL (ref 3.5–5.5)
Alkaline Phosphatase: 51 IU/L (ref 39–117)
BUN/Creatinine Ratio: 12 (ref 9–23)
BUN: 7 mg/dL (ref 6–24)
Bilirubin Total: 0.3 mg/dL (ref 0.0–1.2)
CO2: 24 mmol/L (ref 20–29)
Calcium: 8.7 mg/dL (ref 8.7–10.2)
Chloride: 102 mmol/L (ref 96–106)
Creatinine, Ser: 0.58 mg/dL (ref 0.57–1.00)
GFR calc Af Amer: 129 mL/min/{1.73_m2} (ref >59)
GFR calc non Af Amer: 112 mL/min/{1.73_m2} (ref >59)
Globulin, Total: 2.5 g/dL (ref 1.5–4.5)
Glucose: 87 mg/dL (ref 65–99)
Potassium: 4.5 mmol/L (ref 3.5–5.2)
Sodium: 140 mmol/L (ref 134–144)
Total Protein: 6.5 g/dL (ref 6.0–8.5)

## 2016-11-06 LAB — LIPID PANEL WITH LDL/HDL RATIO
Cholesterol, Total: 168 mg/dL (ref 100–199)
HDL: 53 mg/dL (ref >39)
LDL Calculated: 101 mg/dL — ABNORMAL HIGH (ref 0–99)
LDl/HDL Ratio: 1.9 ratio (ref 0.0–3.2)
Triglycerides: 71 mg/dL (ref 0–149)
VLDL Cholesterol Cal: 14 mg/dL (ref 5–40)

## 2016-11-06 LAB — INSULIN, RANDOM: INSULIN: 12.9 u[IU]/mL (ref 2.6–24.9)

## 2016-11-06 LAB — CBC WITH DIFFERENTIAL
Basophils Absolute: 0 10*3/uL (ref 0.0–0.2)
Basos: 1 %
EOS (ABSOLUTE): 0.1 10*3/uL (ref 0.0–0.4)
Eos: 1 %
Hematocrit: 40.1 % (ref 34.0–46.6)
Hemoglobin: 13.4 g/dL (ref 11.1–15.9)
Immature Grans (Abs): 0 10*3/uL (ref 0.0–0.1)
Immature Granulocytes: 0 %
Lymphocytes Absolute: 3 10*3/uL (ref 0.7–3.1)
Lymphs: 38 %
MCH: 31.1 pg (ref 26.6–33.0)
MCHC: 33.4 g/dL (ref 31.5–35.7)
MCV: 93 fL (ref 79–97)
Monocytes Absolute: 0.4 10*3/uL (ref 0.1–0.9)
Monocytes: 5 %
Neutrophils Absolute: 4.5 10*3/uL (ref 1.4–7.0)
Neutrophils: 55 %
RBC: 4.31 x10E6/uL (ref 3.77–5.28)
RDW: 14.5 % (ref 12.3–15.4)
WBC: 8.1 10*3/uL (ref 3.4–10.8)

## 2016-11-06 LAB — FOLATE: Folate: 9.3 ng/mL (ref >3.0)

## 2016-11-06 LAB — T4, FREE: Free T4: 0.91 ng/dL (ref 0.82–1.77)

## 2016-11-06 LAB — VITAMIN B12: Vitamin B-12: 264 pg/mL (ref 232–1245)

## 2016-11-06 LAB — VITAMIN D 25 HYDROXY (VIT D DEFICIENCY, FRACTURES): Vit D, 25-Hydroxy: 14.7 ng/mL — ABNORMAL LOW (ref 30.0–100.0)

## 2016-11-06 LAB — T3: T3, Total: 111 ng/dL (ref 71–180)

## 2016-11-06 LAB — TSH: TSH: 1.57 u[IU]/mL (ref 0.450–4.500)

## 2016-11-06 LAB — HEMOGLOBIN A1C
Est. average glucose Bld gHb Est-mCnc: 120 mg/dL
Hgb A1c MFr Bld: 5.8 % — ABNORMAL HIGH (ref 4.8–5.6)

## 2016-11-09 NOTE — Progress Notes (Signed)
Office: [phone]  /  Fax: [phone]   Dear Roma Schanz, DO,   Thank you for referring HONESTI SEABERG to our clinic. The following note includes my evaluation and treatment recommendations.  HPI:   Chief Complaint: OBESITY    LENISHA LACAP has been referred by Roma Schanz, DO for consultation regarding her obesity and obesity related comorbidities.    ANWYN KRIEGEL (MR# [id number]) is a 46 y.o. female who presents on 11/09/2016 for obesity evaluation and treatment. Current BMI is Body mass index is 37.12 kg/m.Marland Kitchen Ashyah has been struggling with her weight for many years and has been unsuccessful in either losing weight, maintaining weight loss, or reaching her healthy weight goal.     Rodena Piety attended our information session and states she is currently in the action stage of change and ready to dedicate time achieving and maintaining a healthier weight. Rihana is interested in becoming our patient and working on intensive lifestyle modifications including (but not limited to) diet, exercise and weight loss.    Poonam states her family eats meals together she thinks her family will eat healthier with her her desired weight loss is 60 lbs she started gaining weight during marriage her heaviest weight ever was 245 lbs. she has significant food cravings issues  she skips meals frequently she is frequently drinking liquids with calories she has problems with excessive hunger  she struggles with emotional eating    Fatigue Asha feels her energy is lower than it should be. This has worsened with weight gain and has not worsened recently. Sherle admits to daytime somnolence and  admits to waking up still tired. Patient is at risk for obstructive sleep apnea. Patent has a history of symptoms of daytime fatigue and morning fatigue. Patient generally gets 5 hours of sleep per night, and states they generally have difficulty falling asleep. Snoring is not present. Apneic episodes is  present. Epworth Sleepiness Score is 14  Dyspnea on exertion Rodena Piety notes increasing shortness of breath with exercising and seems to be worsening over time with weight gain. She notes getting out of breath sooner with activity than she used to. This has not gotten worse recently. Kallyn denies orthopnea.  Hypertension ANEYAH LORTZ is a 46 y.o. female with hypertension.  Lyman Bishop denies chest pain. She is working weight loss to help control her blood pressure with the goal of decreasing her risk of heart attack and stroke. Maryjean is on medication but blood pressure is elevated today. She states her blood pressure is normally controlled.  Vitamin D deficiency Jacqueline has a diagnosis of vitamin D deficiency. She is currently on prescription Vit D, she notes fatigue but denies nausea, vomiting or muscle weakness.  Hyperglycemia Kamoni has a history of some elevated blood glucose readings without a diagnosis of diabetes. She has hyperglycemia with a family history of Diabetes Mellitus. She admits to polyphagia.  Depression Screen Shantanique's Food and Mood (modified PHQ-9) score was  Depression screen PHQ 2/9 11/05/2016  Decreased Interest 0  Down, Depressed, Hopeless 1  PHQ - 2 Score 1  Altered sleeping 0  Tired, decreased energy 2  Change in appetite 2  Feeling bad or failure about yourself  0  Trouble concentrating 0  Moving slowly or fidgety/restless 0  Suicidal thoughts 0  PHQ-9 Score 5  Difficult doing work/chores Not difficult at all    ALLERGIES: Allergies  Allergen Reactions  . Lisinopril Swelling and Other (See Comments)    Cough  MEDICATIONS: Current Outpatient Prescriptions on File Prior to Visit  Medication Sig Dispense Refill  . amLODipine (NORVASC) 10 MG tablet Take 1 tablet (10 mg total) by mouth daily. 90 tablet 1  . escitalopram (LEXAPRO) 10 MG tablet Take 1 tablet (10 mg total) by mouth daily. 30 tablet 2  . furosemide (LASIX) 20 MG tablet Take 1 tablet (20 mg  total) by mouth daily. 90 tablet 1  . LORazepam (ATIVAN) 1 MG tablet Take 1 tablet (1 mg total) by mouth at bedtime. 30 tablet 0  . metoprolol succinate (TOPROL XL) 50 MG 24 hr tablet 3 po qd 90 tablet 2  . potassium chloride (K-DUR) 10 MEQ tablet Take 1 tablet (10 mEq total) by mouth daily. 90 tablet 1   No current facility-administered medications on file prior to visit.     PAST MEDICAL HISTORY: Past Medical History:  Diagnosis Date  . Anemia   . Anxiety   . Arthritis   . Depression   . Fatigue   . Fibroids    uterine  . Hypertension   . Insulin resistance   . Joint pain   . Menorrhagia   . Pre-diabetes   . Umbilical hernia   . Vitamin D deficiency     PAST SURGICAL HISTORY: Past Surgical History:  Procedure Laterality Date  . CESAREAN SECTION    . CRYOTHERAPY  1989  . HYSTEROSCOPY W/D&C  06-20-2008  . [place] ABLATION  06-20-2008  . ROBOTIC ASSISTED TOTAL HYSTERECTOMY N/A 08/30/2014   Procedure: ROBOTIC ASSISTED ATTEMPTED, THEN CONVERSION TO OPEN TOTAL ABDOMINAL HYSTERECTOMY WITH BILATERAL SALPINGECTOMY;  Surgeon: Servando Salina, MD;  Location: [facility];  Service: Gynecology;  Laterality: N/A;  . TMJ ARTHROPLASTY  1998   has screws in both side of jaw per pt  . tubal ligation  06-20-2008  . UMBILICAL HERNIA REPAIR N/A 08/30/2014   Procedure: HERNIA REPAIR UMBILICAL ADULT;  Surgeon: Autumn Messing III, MD;  Location: [facility];  Service: General;  Laterality: N/A;    SOCIAL HISTORY: Social History  Substance Use Topics  . Smoking status: Never Smoker  . Smokeless tobacco: Never Used  . Alcohol use No    FAMILY HISTORY: Family History  Problem Relation Age of Onset  . Breast cancer Mother   . Hypertension Mother   . Diabetes Mother   . Cancer Mother        breast  . Hyperlipidemia Mother   . Obesity Mother   . Hypertension Father   . Hyperlipidemia Father   . Anxiety disorder Father   . Breast cancer Maternal Aunt   . Cancer Maternal Aunt        beast     ROS: Review of Systems  Constitutional: Positive for malaise/fatigue. Negative for weight loss.       Trouble sleeping Hold/cold intolerance  Eyes:       Vision changes Wear glasses or contacts  Respiratory:       Negative orthopnea  Cardiovascular:       Calf/leg pain with walking  Gastrointestinal: Negative for nausea and vomiting.  Musculoskeletal:       Negative muscle weakness Muscle or joint pain  Endo/Heme/Allergies:       Negative muscle weakness Positive polyphagia   Psychiatric/Behavioral: Positive for depression.       Stress     PHYSICAL EXAM: Blood pressure (!) 146/99, pulse 89, temperature 98.3 F (36.8 C), temperature source Oral, height 5\' 6"  (1.676 m), weight 230 lb (104.3 kg), last menstrual period  08/17/2014, SpO2 99 %. Body mass index is 37.12 kg/m. Physical Exam  Constitutional: She is oriented to person, place, and time. She appears well-developed and well-nourished.  Cardiovascular: Normal rate.   Pulmonary/Chest: Effort normal.  Musculoskeletal: Normal range of motion.  Neurological: She is oriented to person, place, and time.  Skin: Skin is warm and dry.  Psychiatric: She has a normal mood and affect.  Vitals reviewed.   RECENT LABS AND TESTS: BMET    Component Value Date/Time   NA 140 11/05/2016 0948   K 4.5 11/05/2016 0948   CL 102 11/05/2016 0948   CO2 24 11/05/2016 0948   GLUCOSE 87 11/05/2016 0948   GLUCOSE 86 10/05/2016 1703   BUN 7 11/05/2016 0948   CREATININE 0.58 11/05/2016 0948   CREATININE 0.81 05/01/2013 1105   CALCIUM 8.7 11/05/2016 0948   GFRNONAA 112 11/05/2016 0948   GFRAA 129 11/05/2016 0948   Lab Results  Component Value Date   HGBA1C 5.8 (H) 11/05/2016   Lab Results  Component Value Date   INSULIN 12.9 11/05/2016   CBC    Component Value Date/Time   WBC 8.1 11/05/2016 0948   WBC 8.9 10/05/2016 1703   RBC 4.31 11/05/2016 0948   RBC 4.36 10/05/2016 1703   HGB 13.4 11/05/2016 0948   HCT 40.1  11/05/2016 0948   PLT 449.0 (H) 10/05/2016 1703   MCV 93 11/05/2016 0948   MCH 31.1 11/05/2016 0948   MCH 27.2 08/31/2014 0440   MCHC 33.4 11/05/2016 0948   MCHC 33.0 10/05/2016 1703   RDW 14.5 11/05/2016 0948   LYMPHSABS 3.0 11/05/2016 0948   MONOABS 0.5 10/05/2016 1703   EOSABS 0.1 11/05/2016 0948   BASOSABS 0.0 11/05/2016 0948   Iron/TIBC/Ferritin/ %Sat No results found for: IRON, TIBC, FERRITIN, IRONPCTSAT Lipid Panel     Component Value Date/Time   CHOL 168 11/05/2016 0948   TRIG 71 11/05/2016 0948   HDL 53 11/05/2016 0948   CHOLHDL 4 10/05/2016 1703   VLDL 21.8 10/05/2016 1703   LDLCALC 101 (H) 11/05/2016 0948   Hepatic Function Panel     Component Value Date/Time   PROT 6.5 11/05/2016 0948   ALBUMIN 4.0 11/05/2016 0948   AST 11 11/05/2016 0948   ALT 11 11/05/2016 0948   ALKPHOS 51 11/05/2016 0948   BILITOT 0.3 11/05/2016 0948      Component Value Date/Time   TSH 1.570 11/05/2016 0948   TSH 1.63 05/01/2016 1358   TSH 2.047 08/12/2012 0836    ECG  shows NSR with a rate of 85 BPM INDIRECT CALORIMETER done today shows a VO2 of 285 and a REE of 1982.  Her calculated basal metabolic rate is 0737 thus her basal metabolic rate is better than expected.    ASSESSMENT AND PLAN: Other fatigue - Plan: EKG 12-Lead, Vitamin B12, CBC With Differential, Folate, Lipid Panel With LDL/HDL Ratio, T3, T4, free, TSH  Shortness of breath on exertion  Essential hypertension - Plan: Comprehensive metabolic panel, Lipid Panel With LDL/HDL Ratio  Vitamin D deficiency - Plan: VITAMIN D 25 Hydroxy (Vit-D Deficiency, Fractures)  Hyperglycemia - Plan: Comprehensive metabolic panel, Hemoglobin A1c, Insulin, random  Depression screening  Class 2 severe obesity with serious comorbidity and body mass index (BMI) of 37.0 to 37.9 in adult, unspecified obesity type (HCC)  PLAN: Fatigue Chinaza was informed that her fatigue may be related to obesity, depression or many other causes. Labs  will be ordered, and in the meanwhile Suad has agreed to  work on diet, exercise and weight loss to help with fatigue. Proper sleep hygiene was discussed including the need for 7-8 hours of quality sleep each night. A sleep study was not ordered based on symptoms and Epworth score.  Dyspnea on exertion Janissa's shortness of breath appears to be obesity related and exercise induced. She has agreed to work on weight loss and gradually increase exercise to treat her exercise induced shortness of breath. If Melania follows our instructions and loses weight without improvement of her shortness of breath, we will plan to refer to pulmonology. We will monitor this condition regularly. Faigy agrees to this plan.  Hypertension We discussed sodium restriction, working on healthy weight loss, and a regular exercise program as the means to achieve improved blood pressure control. Mikaelyn agreed with this plan and agreed to follow up as directed. We will check labs and continue to monitor her blood pressure as well as her progress with the above lifestyle modifications. She will continue her medications as prescribed and will watch for signs of hypotension as she continues her lifestyle modifications.  Vitamin D Deficiency Zalea was informed that low vitamin D levels contributes to fatigue and are associated with obesity, breast, and colon cancer. She agrees to contiue taking prescription Vit D @50 ,000 IU every week and will follow up for routine testing of vitamin D, at least 2-3 times per year. She was informed of the risk of over-replacement of vitamin D and agrees to not increase her dose unless he discusses this with [name] first. We will check labs and she will follow up with our clinic as directed.  Hyperglycemia Fasting labs will be obtained and results with be discussed with Rodena Piety in 2 weeks at her follow up visit. In the meanwhile Brylei was started on a lower simple carbohydrate diet and will work on exercise and  weight loss efforts.  Depression Screen Tameyah had a mildly positive depression screening. Depression is commonly associated with obesity and often results in emotional eating behaviors. We will monitor this closely and work on CBT to help improve the non-hunger eating patterns. Referral to Psychology may be required if no improvement is seen as she continues in our clinic.  Obesity Ellora is currently in the action stage of change and her goal is to continue with weight loss efforts. I recommend Zena begin the structured treatment plan as follows:  She has agreed to follow the Category 3 plan Ketara has been instructed to eventually work up to a goal of 150 minutes of combined cardio and strengthening exercise per week for weight loss and overall health benefits. We discussed the following Behavioral Modification Strategies today: increasing lean protein intake, decreasing simple carbohydrates  and work on meal planning and easy cooking plans   She was informed of the importance of frequent follow up visits to maximize her success with intensive lifestyle modifications for her multiple health conditions. She was informed we would discuss her lab results at her next visit unless there is a critical issue that needs to be addressed sooner. Ardith agreed to keep her next visit at the agreed upon time to discuss these results.  I, Trixie Dredge, am acting as transcriptionist for  Dennard Nip, MD  I have reviewed the above documentation for accuracy and completeness, and I agree with the above. -Dennard Nip, MD     Today's visit was # 1 out of 22.  Starting weight: 230 lbs Starting date: 11/05/16 Today's weight : 230 lbs Today's date: 11/05/2016  Total lbs lost to date: 0 (Patients must lose 7 lbs in the first 6 months to continue with counseling)   ASK: We discussed the diagnosis of obesity with Lyman Bishop today and Rabecca agreed to give [name] permission to discuss obesity behavioral  modification therapy today.  ASSESS: Aireonna has the diagnosis of obesity and her BMI today is 37.14 Ashleyann is in the action stage of change   ADVISE: Maribella was educated on the multiple health risks of obesity as well as the benefit of weight loss to improve her health. She was advised of the need for long term treatment and the importance of lifestyle modifications.  AGREE: Multiple dietary modification options and treatment options were discussed and  Ingri agreed to follow the Category 3 plan We discussed the following Behavioral Modification Strategies today: increasing lean protein intake, decreasing simple carbohydrates  and work on meal planning and easy cooking plans

## 2016-11-09 NOTE — Progress Notes (Deleted)
Office: [phone]  /  Fax: [phone]   HPI:   Chief Complaint: OBESITY [name] is here to discuss her progress with her obesity treatment plan. She is on the  {MWMwtlossportion/plan#2:210800006} and is following her eating plan approximately *** % of the time. She states she is exercising *** minutes *** times per week. [name] is currently struggling with ***.  Her weight is 230 lb (104.3 kg) today and {misc; weight loss:12477} since her last visit. She has lost *** lbs since starting treatment with [name].   ALLERGIES: Allergies  Allergen Reactions  . Lisinopril Swelling and Other (See Comments)    Cough     MEDICATIONS: Current Outpatient Prescriptions on File Prior to Visit  Medication Sig Dispense Refill  . amLODipine (NORVASC) 10 MG tablet Take 1 tablet (10 mg total) by mouth daily. 90 tablet 1  . escitalopram (LEXAPRO) 10 MG tablet Take 1 tablet (10 mg total) by mouth daily. 30 tablet 2  . furosemide (LASIX) 20 MG tablet Take 1 tablet (20 mg total) by mouth daily. 90 tablet 1  . LORazepam (ATIVAN) 1 MG tablet Take 1 tablet (1 mg total) by mouth at bedtime. 30 tablet 0  . metoprolol succinate (TOPROL XL) 50 MG 24 hr tablet 3 po qd 90 tablet 2  . potassium chloride (K-DUR) 10 MEQ tablet Take 1 tablet (10 mEq total) by mouth daily. 90 tablet 1   No current facility-administered medications on file prior to visit.     PAST MEDICAL HISTORY: Past Medical History:  Diagnosis Date  . Anemia   . Anxiety   . Arthritis   . Depression   . Fatigue   . Fibroids    uterine  . Hypertension   . Insulin resistance   . Joint pain   . Menorrhagia   . Pre-diabetes   . Umbilical hernia   . Vitamin D deficiency     PAST SURGICAL HISTORY: Past Surgical History:  Procedure Laterality Date  . CESAREAN SECTION    . CRYOTHERAPY  1989  . HYSTEROSCOPY W/D&C  06-20-2008  . [place] ABLATION  06-20-2008  . ROBOTIC ASSISTED TOTAL HYSTERECTOMY N/A 08/30/2014   Procedure: ROBOTIC  ASSISTED ATTEMPTED, THEN CONVERSION TO OPEN TOTAL ABDOMINAL HYSTERECTOMY WITH BILATERAL SALPINGECTOMY;  Surgeon: Servando Salina, MD;  Location: [facility];  Service: Gynecology;  Laterality: N/A;  . TMJ ARTHROPLASTY  1998   has screws in both side of jaw per pt  . tubal ligation  06-20-2008  . UMBILICAL HERNIA REPAIR N/A 08/30/2014   Procedure: HERNIA REPAIR UMBILICAL ADULT;  Surgeon: Autumn Messing III, MD;  Location: [facility];  Service: General;  Laterality: N/A;    SOCIAL HISTORY: Social History  Substance Use Topics  . Smoking status: Never Smoker  . Smokeless tobacco: Never Used  . Alcohol use No    FAMILY HISTORY: Family History  Problem Relation Age of Onset  . Breast cancer Mother   . Hypertension Mother   . Diabetes Mother   . Cancer Mother        breast  . Hyperlipidemia Mother   . Obesity Mother   . Hypertension Father   . Hyperlipidemia Father   . Anxiety disorder Father   . Breast cancer Maternal Aunt   . Cancer Maternal Aunt        beast    ROS: ROS  PHYSICAL EXAM: Blood pressure (!) 146/99, pulse 89, temperature 98.3 F (36.8 C), temperature source Oral, height 5\' 6"  (1.676 m), weight 230 lb (104.3  kg), last menstrual period 08/17/2014, SpO2 99 %. Body mass index is 37.12 kg/m. Physical Exam  RECENT LABS AND TESTS: BMET    Component Value Date/Time   NA 140 11/05/2016 0948   K 4.5 11/05/2016 0948   CL 102 11/05/2016 0948   CO2 24 11/05/2016 0948   GLUCOSE 87 11/05/2016 0948   GLUCOSE 86 10/05/2016 1703   BUN 7 11/05/2016 0948   CREATININE 0.58 11/05/2016 0948   CREATININE 0.81 05/01/2013 1105   CALCIUM 8.7 11/05/2016 0948   GFRNONAA 112 11/05/2016 0948   GFRAA 129 11/05/2016 0948   Lab Results  Component Value Date   HGBA1C 5.8 (H) 11/05/2016   Lab Results  Component Value Date   INSULIN 12.9 11/05/2016   CBC    Component Value Date/Time   WBC 8.1 11/05/2016 0948   WBC 8.9 10/05/2016 1703   RBC 4.31 11/05/2016 0948   RBC 4.36 10/05/2016  1703   HGB 13.4 11/05/2016 0948   HCT 40.1 11/05/2016 0948   PLT 449.0 (H) 10/05/2016 1703   MCV 93 11/05/2016 0948   MCH 31.1 11/05/2016 0948   MCH 27.2 08/31/2014 0440   MCHC 33.4 11/05/2016 0948   MCHC 33.0 10/05/2016 1703   RDW 14.5 11/05/2016 0948   LYMPHSABS 3.0 11/05/2016 0948   MONOABS 0.5 10/05/2016 1703   EOSABS 0.1 11/05/2016 0948   BASOSABS 0.0 11/05/2016 0948   Iron/TIBC/Ferritin/ %Sat No results found for: IRON, TIBC, FERRITIN, IRONPCTSAT Lipid Panel     Component Value Date/Time   CHOL 168 11/05/2016 0948   TRIG 71 11/05/2016 0948   HDL 53 11/05/2016 0948   CHOLHDL 4 10/05/2016 1703   VLDL 21.8 10/05/2016 1703   LDLCALC 101 (H) 11/05/2016 0948   Hepatic Function Panel     Component Value Date/Time   PROT 6.5 11/05/2016 0948   ALBUMIN 4.0 11/05/2016 0948   AST 11 11/05/2016 0948   ALT 11 11/05/2016 0948   ALKPHOS 51 11/05/2016 0948   BILITOT 0.3 11/05/2016 0948      Component Value Date/Time   TSH 1.570 11/05/2016 0948   TSH 1.63 05/01/2016 1358   TSH 2.047 08/12/2012 0836    ASSESSMENT AND PLAN: Other fatigue - Plan: EKG 12-Lead, Vitamin B12, CBC With Differential, Folate, Lipid Panel With LDL/HDL Ratio, T3, T4, free, TSH  Shortness of breath on exertion  Essential hypertension - Plan: Comprehensive metabolic panel, Lipid Panel With LDL/HDL Ratio  Vitamin D deficiency - Plan: VITAMIN D 25 Hydroxy (Vit-D Deficiency, Fractures)  Hyperglycemia - Plan: Comprehensive metabolic panel, Hemoglobin A1c, Insulin, random  Depression screening  Class 2 severe obesity with serious comorbidity and body mass index (BMI) of 37.0 to 37.9 in adult, unspecified obesity type (HCC)  PLAN: [name] {CHL AMB IS/IS NOT:210130109} currently in the action stage of change. As such, her goal is to {MWMwtloss#1:210800005} She has agreed to {MWMwtlossportion/plan#2:210800006} [name] has been instructed to work up to a goal of 150 minutes of combined cardio and  strengthening exercise per week for weight loss and overall health benefits. We discussed the following Behavioral Modification Stratagies today: {MWMwtlossdietstrategies#3:210800007} We discussed various medication options to help [name] with her weight loss efforts and we both agreed to ***  [name] has agreed to follow up with our clinic in {NUMBER 1-10:22536} weeks. She was informed of the importance of frequent follow up visits to maximize her success with intensive lifestyle modifications for her multiple health conditions.  [name], am acting as Location manager for [employer],  MD  I have reviewed the above documentation for accuracy and completeness, and I agree with the above. -Dennard Nip, MD     Today's visit was # 1 out of 22.  Starting weight: 230 lbs  Starting date: 11/05/16 Today's weight : 230 lbs Today's date: 11/05/2016 Total lbs lost to date: 0 (Patients must lose 7 lbs in the first 6 months to continue with counseling)   ASK: We discussed the diagnosis of obesity with [name] today and [name] agreed to give [name] permission to discuss obesity behavioral modification therapy today.  ASSESS: Marshal has the diagnosis of obesity and her BMI today is 37.14 Shykeria is in the action stage of change   ADVISE: Andjela was educated on the multiple health risks of obesity as well as the benefit of weight loss to improve her health. She was advised of the need for long term treatment and the importance of lifestyle modifications.  AGREE: Multiple dietary modification options and treatment options were discussed and  Celestial agreed to {MWMwtlossportion/plan#2:210800006} We discussed the following Behavioral Modification Stratagies today: {MWMwtlossdietstrategies#3:210800007}

## 2016-11-18 ENCOUNTER — Ambulatory Visit (INDEPENDENT_AMBULATORY_CARE_PROVIDER_SITE_OTHER): Payer: BC Managed Care – PPO | Admitting: Family Medicine

## 2016-11-18 VITALS — BP 163/97 | HR 84 | Temp 98.0°F | Ht 66.0 in | Wt 223.0 lb

## 2016-11-18 DIAGNOSIS — Z6836 Body mass index (BMI) 36.0-36.9, adult: Secondary | ICD-10-CM

## 2016-11-18 DIAGNOSIS — R7303 Prediabetes: Secondary | ICD-10-CM

## 2016-11-18 DIAGNOSIS — E559 Vitamin D deficiency, unspecified: Secondary | ICD-10-CM | POA: Diagnosis not present

## 2016-11-18 MED ORDER — METFORMIN HCL 500 MG PO TABS: 500.0000 mg | ORAL_TABLET | Freq: Every day | ORAL | 0 refills | Status: DC

## 2016-11-18 MED ORDER — VITAMIN D (ERGOCALCIFEROL) 1.25 MG (50000 UNIT) PO CAPS: 50000.0000 [IU] | ORAL_CAPSULE | ORAL | 0 refills | Status: DC

## 2016-11-19 NOTE — Progress Notes (Signed)
Office: [phone]  /  Fax: [phone]   HPI:   Chief Complaint: OBESITY [name] is here to discuss her progress with her obesity treatment plan. She is on the Category 3 plan and is following her eating plan approximately 80 % of the time. She states she is exercising 0 minutes 0 times per week. [name] did well with weight loss on the Category 3 plan. She worked to plan meals ahead of time and still ate out some but made better choices. She states her hunger was mostly controlled on diet prescription.  Her weight is 223 lb (101.2 kg) today and has had a weight loss of 7 pounds over a period of 2 weeks since her last visit. She has lost 7 lbs since starting treatment with [name].  Vitamin D deficiency [name] has a new diagnosis of vitamin D deficiency. She is not currently taking vit D, she notes fatigue and denies nausea, vomiting or muscle weakness.  Pre-Diabetes [name] has a diagnosis of pre-diabetes based on her elevated Hgb A1c and was informed this puts her at greater risk of developing diabetes. She has a family history of diabetes mellitus, A1c is elevated. She is not taking metformin currently and continues to work on diet and exercise to decrease risk of diabetes. She admits polyphagia and denies nausea or hypoglycemia.  ALLERGIES: Allergies  Allergen Reactions  . Lisinopril Swelling and Other (See Comments)    Cough     MEDICATIONS: Current Outpatient Prescriptions on File Prior to Visit  Medication Sig Dispense Refill  . amLODipine (NORVASC) 10 MG tablet Take 1 tablet (10 mg total) by mouth daily. 90 tablet 1  . escitalopram (LEXAPRO) 10 MG tablet Take 1 tablet (10 mg total) by mouth daily. 30 tablet 2  . furosemide (LASIX) 20 MG tablet Take 1 tablet (20 mg total) by mouth daily. 90 tablet 1  . LORazepam (ATIVAN) 1 MG tablet Take 1 tablet (1 mg total) by mouth at bedtime. 30 tablet 0  . metoprolol succinate (TOPROL XL) 50 MG 24 hr tablet 3 po qd 90 tablet 2  . potassium  chloride (K-DUR) 10 MEQ tablet Take 1 tablet (10 mEq total) by mouth daily. 90 tablet 1   No current facility-administered medications on file prior to visit.     PAST MEDICAL HISTORY: Past Medical History:  Diagnosis Date  . Anemia   . Anxiety   . Arthritis   . Depression   . Fatigue   . Fibroids    uterine  . Hypertension   . Insulin resistance   . Joint pain   . Menorrhagia   . Pre-diabetes   . Umbilical hernia   . Vitamin D deficiency     PAST SURGICAL HISTORY: Past Surgical History:  Procedure Laterality Date  . CESAREAN SECTION    . CRYOTHERAPY  1989  . HYSTEROSCOPY W/D&C  06-20-2008  . [place] ABLATION  06-20-2008  . ROBOTIC ASSISTED TOTAL HYSTERECTOMY N/A 08/30/2014   Procedure: ROBOTIC ASSISTED ATTEMPTED, THEN CONVERSION TO OPEN TOTAL ABDOMINAL HYSTERECTOMY WITH BILATERAL SALPINGECTOMY;  Surgeon: Servando Salina, MD;  Location: [facility];  Service: Gynecology;  Laterality: N/A;  . TMJ ARTHROPLASTY  1998   has screws in both side of jaw per pt  . tubal ligation  06-20-2008  . UMBILICAL HERNIA REPAIR N/A 08/30/2014   Procedure: HERNIA REPAIR UMBILICAL ADULT;  Surgeon: Autumn Messing III, MD;  Location: [facility];  Service: General;  Laterality: N/A;    SOCIAL HISTORY: Social History  Substance  Use Topics  . Smoking status: Never Smoker  . Smokeless tobacco: Never Used  . Alcohol use No    FAMILY HISTORY: Family History  Problem Relation Age of Onset  . Breast cancer Mother   . Hypertension Mother   . Diabetes Mother   . Cancer Mother        breast  . Hyperlipidemia Mother   . Obesity Mother   . Hypertension Father   . Hyperlipidemia Father   . Anxiety disorder Father   . Breast cancer Maternal Aunt   . Cancer Maternal Aunt        beast    ROS: Review of Systems  Constitutional: Positive for malaise/fatigue and weight loss.  Gastrointestinal: Negative for nausea and vomiting.  Musculoskeletal:       Negative muscle weakness  Endo/Heme/Allergies:        Positive polyphagia Negative hypoglycemia    PHYSICAL EXAM: Blood pressure (!) 163/97, pulse 84, temperature 98 F (36.7 C), temperature source Oral, height 5\' 6"  (1.676 m), weight 223 lb (101.2 kg), last menstrual period 08/17/2014, SpO2 98 %. Body mass index is 35.99 kg/m. Physical Exam  Constitutional: She is oriented to person, place, and time. She appears well-developed and well-nourished.  Cardiovascular: Normal rate.   Pulmonary/Chest: Effort normal.  Musculoskeletal: Normal range of motion.  Neurological: She is oriented to person, place, and time.  Skin: Skin is warm and dry.  Psychiatric: She has a normal mood and affect. Her behavior is normal.  Vitals reviewed.   RECENT LABS AND TESTS: BMET    Component Value Date/Time   NA 140 11/05/2016 0948   K 4.5 11/05/2016 0948   CL 102 11/05/2016 0948   CO2 24 11/05/2016 0948   GLUCOSE 87 11/05/2016 0948   GLUCOSE 86 10/05/2016 1703   BUN 7 11/05/2016 0948   CREATININE 0.58 11/05/2016 0948   CREATININE 0.81 05/01/2013 1105   CALCIUM 8.7 11/05/2016 0948   GFRNONAA 112 11/05/2016 0948   GFRAA 129 11/05/2016 0948   Lab Results  Component Value Date   HGBA1C 5.8 (H) 11/05/2016   Lab Results  Component Value Date   INSULIN 12.9 11/05/2016   CBC    Component Value Date/Time   WBC 8.1 11/05/2016 0948   WBC 8.9 10/05/2016 1703   RBC 4.31 11/05/2016 0948   RBC 4.36 10/05/2016 1703   HGB 13.4 11/05/2016 0948   HCT 40.1 11/05/2016 0948   PLT 449.0 (H) 10/05/2016 1703   MCV 93 11/05/2016 0948   MCH 31.1 11/05/2016 0948   MCH 27.2 08/31/2014 0440   MCHC 33.4 11/05/2016 0948   MCHC 33.0 10/05/2016 1703   RDW 14.5 11/05/2016 0948   LYMPHSABS 3.0 11/05/2016 0948   MONOABS 0.5 10/05/2016 1703   EOSABS 0.1 11/05/2016 0948   BASOSABS 0.0 11/05/2016 0948   Iron/TIBC/Ferritin/ %Sat No results found for: IRON, TIBC, FERRITIN, IRONPCTSAT Lipid Panel     Component Value Date/Time   CHOL 168 11/05/2016 0948   TRIG  71 11/05/2016 0948   HDL 53 11/05/2016 0948   CHOLHDL 4 10/05/2016 1703   VLDL 21.8 10/05/2016 1703   LDLCALC 101 (H) 11/05/2016 0948   Hepatic Function Panel     Component Value Date/Time   PROT 6.5 11/05/2016 0948   ALBUMIN 4.0 11/05/2016 0948   AST 11 11/05/2016 0948   ALT 11 11/05/2016 0948   ALKPHOS 51 11/05/2016 0948   BILITOT 0.3 11/05/2016 0948      Component Value Date/Time  TSH 1.570 11/05/2016 0948   TSH 1.63 05/01/2016 1358   TSH 2.047 08/12/2012 0836    ASSESSMENT AND PLAN: Vitamin D deficiency - Plan: Vitamin D, Ergocalciferol, (DRISDOL) 50000 units CAPS capsule  Prediabetes - Plan: metFORMIN (GLUCOPHAGE) 500 MG tablet  Class 2 severe obesity with serious comorbidity and body mass index (BMI) of 36.0 to 36.9 in adult, unspecified obesity type ([name])  PLAN:  Vitamin D Deficiency [name] was informed that low vitamin D levels contributes to fatigue and are associated with obesity, breast, and colon cancer. [name] agrees to start prescription Vit D @50 ,000 IU every week #4 with no refills and will follow up for routine testing of vitamin D, at least 2-3 times per year. She was informed of the risk of over-replacement of vitamin D and agrees to not increase her dose unless he discusses this with [name] first. [name] agrees to follow up with our clinic in 2 weeks.  Pre-Diabetes [name] will continue to work on weight loss, diet, exercise, and decreasing simple carbohydrates in her diet to help decrease the risk of diabetes. We dicussed metformin including benefits and risks. She was informed that eating too many simple carbohydrates or too many calories at one sitting increases the likelihood of GI side effects. [name] agrees to start metformin 500 mg q AM #30 with no refills.  [name] agrees to follow up with our clinic in 2 weeks as directed to monitor her progress.  Obesity [name] is currently in the action stage of change. As such, her goal is to continue with weight loss  efforts She has agreed to follow the Category 3 plan [name] has been instructed to work up to a goal of 150 minutes of combined cardio and strengthening exercise per week for weight loss and overall health benefits. We discussed the following Behavioral Modification Strategies today: increasing lean protein intake, decreasing simple carbohydrates, work on meal planning and easy cooking plans, avoiding temptations, and planning for success   [name] has agreed to follow up with our clinic in 2 weeks. She was informed of the importance of frequent follow up visits to maximize her success with intensive lifestyle modifications for her multiple health conditions.  I, Trixie Dredge, am acting as transcriptionist for Dennard Nip, MD  I have reviewed the above documentation for accuracy and completeness, and I agree with the above. -Dennard Nip, MD     Today's visit was # 2 out of 31.  Starting weight: 230 lbs Starting date: 11/05/16 Today's weight : 223 lbs  Today's date: 11/18/2016 Total lbs lost to date: 7 (Patients must lose 7 lbs in the first 6 months to continue with counseling)   ASK: We discussed the diagnosis of obesity with [name] today and [name] agreed to give [name] permission to discuss obesity behavioral modification therapy today.  ASSESS: [name] has the diagnosis of obesity and her BMI today is 36.01 [name] is in the action stage of change   ADVISE: [name] was educated on the multiple health risks of obesity as well as the benefit of weight loss to improve her health. She was advised of the need for long term treatment and the importance of lifestyle modifications.  AGREE: Multiple dietary modification options and treatment options were discussed and  [name] agreed to follow the Category 3 plan We discussed the following Behavioral Modification Strategies today: increasing lean protein intake, decreasing simple carbohydrates, work on meal planning and easy cooking plans,  avoiding temptations, and planning for success.

## 2016-12-02 ENCOUNTER — Ambulatory Visit (INDEPENDENT_AMBULATORY_CARE_PROVIDER_SITE_OTHER): Payer: BC Managed Care – PPO | Admitting: Family Medicine

## 2016-12-02 VITALS — BP 151/96 | HR 93 | Temp 98.1°F | Ht 66.0 in | Wt 222.0 lb

## 2016-12-02 DIAGNOSIS — Z6835 Body mass index (BMI) 35.0-35.9, adult: Secondary | ICD-10-CM

## 2016-12-02 DIAGNOSIS — R7303 Prediabetes: Secondary | ICD-10-CM | POA: Diagnosis not present

## 2016-12-02 DIAGNOSIS — I10 Essential (primary) hypertension: Secondary | ICD-10-CM | POA: Diagnosis not present

## 2016-12-02 MED ORDER — METFORMIN HCL 500 MG PO TABS: 500.0000 mg | ORAL_TABLET | Freq: Every day | ORAL | 0 refills | Status: DC

## 2016-12-03 NOTE — Progress Notes (Signed)
Office: [phone]  /  Fax: [phone]   HPI:   Chief Complaint: OBESITY [name] is here to discuss her progress with her obesity treatment plan. She is on the Category 3 plan and is following her eating plan approximately 80 % of the time. She states she is exercising 0 minutes 0 times per week. [name] continues to do well with weight loss. She did have some celebration eating but was able to get back on track pretty quickly. She is worried about Thanksgiving weight gain.  Her weight is 222 lb (100.7 kg) today and has had a weight loss of 1 pound over a period of 2 weeks since her last visit. She has lost 8 lbs since starting treatment with [name].  Pre-Diabetes [name] has a diagnosis of pre-diabetes based on her elevated Hgb A1c and was informed this puts her at greater risk of developing diabetes. [name] started metformin and she is doing well. She notes mild GI upset but this has improved. She continues to work on diet and exercise to decrease risk of diabetes. She denies nausea or hypoglycemia.  Hypertension [name] is a 46 y.o. female with hypertension. She is on lasix daily, metoprolol, and amlodipine. She states her blood pressure at home runs lower.   [name] denies chest pain or headaches. She is working weight loss to help control her blood pressure with the goal of decreasing her risk of heart attack and stroke.   ALLERGIES: Allergies  Allergen Reactions  . Lisinopril Swelling and Other (See Comments)    Cough     MEDICATIONS: Current Outpatient Medications on File Prior to Visit  Medication Sig Dispense Refill  . amLODipine (NORVASC) 10 MG tablet Take 1 tablet (10 mg total) by mouth daily. 90 tablet 1  . escitalopram (LEXAPRO) 10 MG tablet Take 1 tablet (10 mg total) by mouth daily. 30 tablet 2  . furosemide (LASIX) 20 MG tablet Take 1 tablet (20 mg total) by mouth daily. 90 tablet 1  . LORazepam (ATIVAN) 1 MG tablet Take 1 tablet (1 mg total) by mouth at bedtime. 30  tablet 0  . metoprolol succinate (TOPROL XL) 50 MG 24 hr tablet 3 po qd 90 tablet 2  . potassium chloride (K-DUR) 10 MEQ tablet Take 1 tablet (10 mEq total) by mouth daily. 90 tablet 1  . Vitamin D, Ergocalciferol, (DRISDOL) 50000 units CAPS capsule Take 1 capsule (50,000 Units total) by mouth every 7 (seven) days. 4 capsule 0   No current facility-administered medications on file prior to visit.     PAST MEDICAL HISTORY: Past Medical History:  Diagnosis Date  . Anemia   . Anxiety   . Arthritis   . Depression   . Fatigue   . Fibroids    uterine  . Hypertension   . Insulin resistance   . Joint pain   . Menorrhagia   . Pre-diabetes   . Umbilical hernia   . Vitamin D deficiency     PAST SURGICAL HISTORY: Past Surgical History:  Procedure Laterality Date  . CESAREAN SECTION    . CRYOTHERAPY  1989  . HYSTEROSCOPY W/D&C  06-20-2008  . [place] ABLATION  06-20-2008  . TMJ ARTHROPLASTY  1998   has screws in both side of jaw per pt  . tubal ligation  06-20-2008    SOCIAL HISTORY: Social History   Tobacco Use  . Smoking status: Never Smoker  . Smokeless tobacco: Never Used  Substance Use Topics  . Alcohol use: No  .  Drug use: No    FAMILY HISTORY: Family History  Problem Relation Age of Onset  . Breast cancer Mother   . Hypertension Mother   . Diabetes Mother   . Cancer Mother        breast  . Hyperlipidemia Mother   . Obesity Mother   . Hypertension Father   . Hyperlipidemia Father   . Anxiety disorder Father   . Breast cancer Maternal Aunt   . Cancer Maternal Aunt        beast    ROS: Review of Systems  Constitutional: Positive for weight loss.  Cardiovascular: Negative for chest pain.  Gastrointestinal: Negative for nausea.  Neurological: Negative for headaches.  Endo/Heme/Allergies:       Negative hypoglycemia    PHYSICAL EXAM: Blood pressure (!) 151/96, pulse 93, temperature 98.1 F (36.7 C), temperature source Oral, height 5\' 6"  (1.676 m),  weight 222 lb (100.7 kg), last menstrual period 08/17/2014, SpO2 99 %. Body mass index is 35.83 kg/m. Physical Exam  Constitutional: She is oriented to person, place, and time. She appears well-developed and well-nourished.  Cardiovascular: Normal rate.  Pulmonary/Chest: Effort normal.  Musculoskeletal: Normal range of motion.  Neurological: She is oriented to person, place, and time.  Skin: Skin is warm and dry.  Psychiatric: She has a normal mood and affect. Her behavior is normal.  Vitals reviewed.   RECENT LABS AND TESTS: BMET    Component Value Date/Time   NA 140 11/05/2016 0948   K 4.5 11/05/2016 0948   CL 102 11/05/2016 0948   CO2 24 11/05/2016 0948   GLUCOSE 87 11/05/2016 0948   GLUCOSE 86 10/05/2016 1703   BUN 7 11/05/2016 0948   CREATININE 0.58 11/05/2016 0948   CREATININE 0.81 05/01/2013 1105   CALCIUM 8.7 11/05/2016 0948   GFRNONAA 112 11/05/2016 0948   GFRAA 129 11/05/2016 0948   Lab Results  Component Value Date   HGBA1C 5.8 (H) 11/05/2016   Lab Results  Component Value Date   INSULIN 12.9 11/05/2016   CBC    Component Value Date/Time   WBC 8.1 11/05/2016 0948   WBC 8.9 10/05/2016 1703   RBC 4.31 11/05/2016 0948   RBC 4.36 10/05/2016 1703   HGB 13.4 11/05/2016 0948   HCT 40.1 11/05/2016 0948   PLT 449.0 (H) 10/05/2016 1703   MCV 93 11/05/2016 0948   MCH 31.1 11/05/2016 0948   MCH 27.2 08/31/2014 0440   MCHC 33.4 11/05/2016 0948   MCHC 33.0 10/05/2016 1703   RDW 14.5 11/05/2016 0948   LYMPHSABS 3.0 11/05/2016 0948   MONOABS 0.5 10/05/2016 1703   EOSABS 0.1 11/05/2016 0948   BASOSABS 0.0 11/05/2016 0948   Iron/TIBC/Ferritin/ %Sat No results found for: IRON, TIBC, FERRITIN, IRONPCTSAT Lipid Panel     Component Value Date/Time   CHOL 168 11/05/2016 0948   TRIG 71 11/05/2016 0948   HDL 53 11/05/2016 0948   CHOLHDL 4 10/05/2016 1703   VLDL 21.8 10/05/2016 1703   LDLCALC 101 (H) 11/05/2016 0948   Hepatic Function Panel     Component  Value Date/Time   PROT 6.5 11/05/2016 0948   ALBUMIN 4.0 11/05/2016 0948   AST 11 11/05/2016 0948   ALT 11 11/05/2016 0948   ALKPHOS 51 11/05/2016 0948   BILITOT 0.3 11/05/2016 0948      Component Value Date/Time   TSH 1.570 11/05/2016 0948   TSH 1.63 05/01/2016 1358   TSH 2.047 08/12/2012 0836    ASSESSMENT AND PLAN: Prediabetes -  Plan: metFORMIN (GLUCOPHAGE) 500 MG tablet  Essential hypertension  Class 2 severe obesity with serious comorbidity and body mass index (BMI) of 35.0 to 35.9 in adult, unspecified obesity type ([place])  PLAN:  Pre-Diabetes [name] will continue to work on weight loss, exercise, and decreasing simple carbohydrates in her diet to help decrease the risk of diabetes. We dicussed metformin including benefits and risks. She was informed that eating too many simple carbohydrates or too many calories at one sitting increases the likelihood of GI side effects. [name] agrees to continue taking metformin 500 mg q AM #30 and we will refill for 1 month. [name] agrees to follow up with our clinic in 3 weeks as directed to monitor her progress.  Hypertension We discussed sodium restriction, and to continue working on healthy weight loss, diet, and a regular exercise program as the means to achieve improved blood pressure control. [name] agreed with this plan and agreed to follow up as directed. She likely has white coat hypertension and we will continue to monitor her blood pressure as well as her progress with the above lifestyle modifications. She will continue her medications as prescribed and will watch for signs of hypotension as she continues her lifestyle modifications. [name] agrees to follow up with our clinic in 3 weeks.  Obesity [name] is currently in the action stage of change. As such, her goal is to continue with weight loss efforts She has agreed to follow the Category 3 plan [name] has been instructed to work up to a goal of 150 minutes of combined cardio and  strengthening exercise per week for weight loss and overall health benefits. We discussed the following Behavioral Modification Strategies today: work on meal planning and easy cooking plans, holiday eating strategies, and celebration eating strategies.    [name] has agreed to follow up with our clinic in 3 weeks. She was informed of the importance of frequent follow up visits to maximize her success with intensive lifestyle modifications for her multiple health conditions.  I, [name], am acting as transcriptionist for Gail Nip, MD  I have reviewed the above documentation for accuracy and completeness, and I agree with the above. -Gail Nip, MD      Today's visit was # 3 out of 22.  Starting weight: 230 lbs Starting date: 11/05/16 Today's weight : 222 lbs  Today's date: 12/02/2016 Total lbs lost to date: 8 (Patients must lose 7 lbs in the first 6 months to continue with counseling)   ASK: We discussed the diagnosis of obesity with [name] today and [name] agreed to give [name] permission to discuss obesity behavioral modification therapy today.  ASSESS: [name] has the diagnosis of obesity and her BMI today is 35.85 [name] is in the action stage of change   ADVISE: [name] was educated on the multiple health risks of obesity as well as the benefit of weight loss to improve her health. She was advised of the need for long term treatment and the importance of lifestyle modifications.  AGREE: Multiple dietary modification options and treatment options were discussed and  [name] agreed to follow the Category 3 plan We discussed the following Behavioral Modification Strategies today: work on meal planning and easy cooking plans, holiday eating strategies, and celebration eating strategies.

## 2016-12-23 ENCOUNTER — Ambulatory Visit (INDEPENDENT_AMBULATORY_CARE_PROVIDER_SITE_OTHER): Payer: BC Managed Care – PPO | Admitting: Family Medicine

## 2016-12-23 VITALS — BP 142/89 | HR 92 | Temp 98.5°F | Ht 66.0 in | Wt 218.0 lb

## 2016-12-23 DIAGNOSIS — Z6841 Body Mass Index (BMI) 40.0 and over, adult: Secondary | ICD-10-CM

## 2016-12-23 DIAGNOSIS — R7303 Prediabetes: Secondary | ICD-10-CM

## 2016-12-23 DIAGNOSIS — E559 Vitamin D deficiency, unspecified: Secondary | ICD-10-CM

## 2016-12-23 MED ORDER — VITAMIN D (ERGOCALCIFEROL) 1.25 MG (50000 UNIT) PO CAPS: 50000.0000 [IU] | ORAL_CAPSULE | ORAL | 0 refills | Status: DC

## 2016-12-23 MED ORDER — METFORMIN HCL 500 MG PO TABS: 500.0000 mg | ORAL_TABLET | Freq: Every day | ORAL | 0 refills | Status: DC

## 2016-12-23 NOTE — Progress Notes (Signed)
Office: [phone]  /  Fax: [phone]   HPI:   Chief Complaint: OBESITY [name] is here to discuss her progress with her obesity treatment plan. She is on the Category 3 plan and is following her eating plan approximately 75 % of the time. She states she is walking for 30 minutes 5 times per week. [name] continues to do well with weight loss on the Category 3 plan, but really misses eating pasta. Her weight is 218 lb (98.9 kg) today and has had a weight loss of 4 pounds over a period of 3 weeks since her last visit. She has lost 12 lbs since starting treatment with [name].  Pre-Diabetes [name] has a diagnosis of pre-diabetes based on her elevated Hgb A1c and was informed this puts her at greater risk of developing diabetes. She is stable on metformin, no GI upset, and doing well with diet and weight loss. She denies nausea or hypoglycemia.  Vitamin D deficiency [name] has a diagnosis of vitamin D deficiency. She is stable on prescription Vit D, but not yet at goal. She denies nausea, vomiting or muscle weakness.  ALLERGIES: Allergies  Allergen Reactions  . Lisinopril Swelling and Other (See Comments)    Cough     MEDICATIONS: Current Outpatient Medications on File Prior to Visit  Medication Sig Dispense Refill  . amLODipine (NORVASC) 10 MG tablet Take 1 tablet (10 mg total) by mouth daily. 90 tablet 1  . escitalopram (LEXAPRO) 10 MG tablet Take 1 tablet (10 mg total) by mouth daily. 30 tablet 2  . furosemide (LASIX) 20 MG tablet Take 1 tablet (20 mg total) by mouth daily. 90 tablet 1  . LORazepam (ATIVAN) 1 MG tablet Take 1 tablet (1 mg total) by mouth at bedtime. 30 tablet 0  . metoprolol succinate (TOPROL XL) 50 MG 24 hr tablet 3 po qd 90 tablet 2  . potassium chloride (K-DUR) 10 MEQ tablet Take 1 tablet (10 mEq total) by mouth daily. 90 tablet 1   No current facility-administered medications on file prior to visit.     PAST MEDICAL HISTORY: Past Medical History:  Diagnosis  Date  . Anemia   . Anxiety   . Arthritis   . Depression   . Fatigue   . Fibroids    uterine  . Hypertension   . Insulin resistance   . Joint pain   . Menorrhagia   . Pre-diabetes   . Umbilical hernia   . Vitamin D deficiency     PAST SURGICAL HISTORY: Past Surgical History:  Procedure Laterality Date  . CESAREAN SECTION    . CRYOTHERAPY  1989  . HYSTEROSCOPY W/D&C  06-20-2008  . [place] ABLATION  06-20-2008  . ROBOTIC ASSISTED TOTAL HYSTERECTOMY N/A 08/30/2014   Procedure: ROBOTIC ASSISTED ATTEMPTED, THEN CONVERSION TO OPEN TOTAL ABDOMINAL HYSTERECTOMY WITH BILATERAL SALPINGECTOMY;  Surgeon: Servando Salina, MD;  Location: [facility];  Service: Gynecology;  Laterality: N/A;  . TMJ ARTHROPLASTY  1998   has screws in both side of jaw per pt  . tubal ligation  06-20-2008  . UMBILICAL HERNIA REPAIR N/A 08/30/2014   Procedure: HERNIA REPAIR UMBILICAL ADULT;  Surgeon: Autumn Messing III, MD;  Location: [facility];  Service: General;  Laterality: N/A;    SOCIAL HISTORY: Social History   Tobacco Use  . Smoking status: Never Smoker  . Smokeless tobacco: Never Used  Substance Use Topics  . Alcohol use: No  . Drug use: No    FAMILY HISTORY: Family History  Problem Relation Age of Onset  . Breast cancer Mother   . Hypertension Mother   . Diabetes Mother   . Cancer Mother        breast  . Hyperlipidemia Mother   . Obesity Mother   . Hypertension Father   . Hyperlipidemia Father   . Anxiety disorder Father   . Breast cancer Maternal Aunt   . Cancer Maternal Aunt        beast    ROS: Review of Systems  Constitutional: Positive for weight loss.  Gastrointestinal: Negative for nausea and vomiting.  Musculoskeletal:       Negative muscle weakness  Endo/Heme/Allergies:       Negative hypoglycemia    PHYSICAL EXAM: Blood pressure (!) 142/89, pulse 92, temperature 98.5 F (36.9 C), temperature source Oral, height 5\' 6"  (1.676 m), weight 218 lb (98.9 kg), last menstrual period  08/17/2014, SpO2 98 %. Body mass index is 35.19 kg/m. Physical Exam  Constitutional: She is oriented to person, place, and time. She appears well-developed and well-nourished.  Cardiovascular: Normal rate.  Pulmonary/Chest: Effort normal.  Musculoskeletal: Normal range of motion.  Neurological: She is oriented to person, place, and time.  Skin: Skin is warm and dry.  Psychiatric: She has a normal mood and affect. Her behavior is normal.  Vitals reviewed.   RECENT LABS AND TESTS: BMET    Component Value Date/Time   NA 140 11/05/2016 0948   K 4.5 11/05/2016 0948   CL 102 11/05/2016 0948   CO2 24 11/05/2016 0948   GLUCOSE 87 11/05/2016 0948   GLUCOSE 86 10/05/2016 1703   BUN 7 11/05/2016 0948   CREATININE 0.58 11/05/2016 0948   CREATININE 0.81 05/01/2013 1105   CALCIUM 8.7 11/05/2016 0948   GFRNONAA 112 11/05/2016 0948   GFRAA 129 11/05/2016 0948   Lab Results  Component Value Date   HGBA1C 5.8 (H) 11/05/2016   Lab Results  Component Value Date   INSULIN 12.9 11/05/2016   CBC    Component Value Date/Time   WBC 8.1 11/05/2016 0948   WBC 8.9 10/05/2016 1703   RBC 4.31 11/05/2016 0948   RBC 4.36 10/05/2016 1703   HGB 13.4 11/05/2016 0948   HCT 40.1 11/05/2016 0948   PLT 449.0 (H) 10/05/2016 1703   MCV 93 11/05/2016 0948   MCH 31.1 11/05/2016 0948   MCH 27.2 08/31/2014 0440   MCHC 33.4 11/05/2016 0948   MCHC 33.0 10/05/2016 1703   RDW 14.5 11/05/2016 0948   LYMPHSABS 3.0 11/05/2016 0948   MONOABS 0.5 10/05/2016 1703   EOSABS 0.1 11/05/2016 0948   BASOSABS 0.0 11/05/2016 0948   Iron/TIBC/Ferritin/ %Sat No results found for: IRON, TIBC, FERRITIN, IRONPCTSAT Lipid Panel     Component Value Date/Time   CHOL 168 11/05/2016 0948   TRIG 71 11/05/2016 0948   HDL 53 11/05/2016 0948   CHOLHDL 4 10/05/2016 1703   VLDL 21.8 10/05/2016 1703   LDLCALC 101 (H) 11/05/2016 0948   Hepatic Function Panel     Component Value Date/Time   PROT 6.5 11/05/2016 0948    ALBUMIN 4.0 11/05/2016 0948   AST 11 11/05/2016 0948   ALT 11 11/05/2016 0948   ALKPHOS 51 11/05/2016 0948   BILITOT 0.3 11/05/2016 0948      Component Value Date/Time   TSH 1.570 11/05/2016 0948   TSH 1.63 05/01/2016 1358   TSH 2.047 08/12/2012 0836    ASSESSMENT AND PLAN: Prediabetes - Plan: metFORMIN (GLUCOPHAGE) 500 MG tablet  Vitamin  D deficiency - Plan: Vitamin D, Ergocalciferol, (DRISDOL) 50000 units CAPS capsule  Class 3 severe obesity with serious comorbidity and body mass index (BMI) of 50.0 to 59.9 in adult, unspecified obesity type ([place])  PLAN:  Pre-Diabetes [name] will continue to work on weight loss, exercise, and decreasing simple carbohydrates in her diet to help decrease the risk of diabetes. We dicussed metformin including benefits and risks. She was informed that eating too many simple carbohydrates or too many calories at one sitting increases the likelihood of GI side effects. [name] agrees to continue taking metformin 500 mg q AM #30 and we will refill for 1 month. [name] agrees to follow up with our clinic in 3 weeks as directed to monitor her progress.  Vitamin D Deficiency [name] was informed that low vitamin D levels contributes to fatigue and are associated with obesity, breast, and colon cancer. [name] agrees to continue taking prescription Vit D @50 ,000 IU every week #4 and we will refill for 1 month. She will follow up for routine testing of vitamin D, at least 2-3 times per year. She was informed of the risk of over-replacement of vitamin D and agrees to not increase her dose unless he discusses this with [name] first. [name] agrees to follow up with our clinic in 3 weeks.  Obesity [name] is currently in the action stage of change. As such, her goal is to continue with weight loss efforts She has agreed to follow the Category 3 plan + higher protein pasta recipe for dinner 1 time per week [name] has been instructed to work up to a goal of 150 minutes of combined  cardio and strengthening exercise per week for weight loss and overall health benefits. We discussed the following Behavioral Modification Strategies today: work on meal planning and easy cooking plans and holiday eating strategies and no skipping meals   [name] has agreed to follow up with our clinic in 3 weeks. She was informed of the importance of frequent follow up visits to maximize her success with intensive lifestyle modifications for her multiple health conditions.  I, Trixie Dredge, am acting as transcriptionist for Dennard Nip, MD  I have reviewed the above documentation for accuracy and completeness, and I agree with the above. -Dennard Nip, MD     Today's visit was # 4 out of 22.  Starting weight: 230 lbs Starting date: 11/05/16 Today's weight : 218 lbs  Today's date: 12/23/2016 Total lbs lost to date: 12 (Patients must lose 7 lbs in the first 6 months to continue with counseling)   ASK: We discussed the diagnosis of obesity with [name] today and [name] agreed to give [name] permission to discuss obesity behavioral modification therapy today.  ASSESS: [name] has the diagnosis of obesity and her BMI today is 35.2 [name] is in the action stage of change   ADVISE: [name] was educated on the multiple health risks of obesity as well as the benefit of weight loss to improve her health. She was advised of the need for long term treatment and the importance of lifestyle modifications.  AGREE: Multiple dietary modification options and treatment options were discussed and  [name] agreed to follow the Category 3 plan + higher protein pasta recipe for dinner 1 time per week We discussed the following Behavioral Modification Strategies today: work on meal planning and easy cooking plans and holiday eating strategies and no skipping meals

## 2017-01-13 ENCOUNTER — Ambulatory Visit (INDEPENDENT_AMBULATORY_CARE_PROVIDER_SITE_OTHER): Payer: BC Managed Care – PPO | Admitting: Family Medicine

## 2017-02-01 ENCOUNTER — Ambulatory Visit (INDEPENDENT_AMBULATORY_CARE_PROVIDER_SITE_OTHER): Payer: BC Managed Care – PPO | Admitting: Family Medicine

## 2017-02-01 VITALS — BP 140/98 | HR 75 | Temp 98.3°F | Ht 66.0 in | Wt 222.0 lb

## 2017-02-01 DIAGNOSIS — R7303 Prediabetes: Secondary | ICD-10-CM | POA: Diagnosis not present

## 2017-02-01 DIAGNOSIS — E559 Vitamin D deficiency, unspecified: Secondary | ICD-10-CM

## 2017-02-01 DIAGNOSIS — Z6835 Body mass index (BMI) 35.0-35.9, adult: Secondary | ICD-10-CM | POA: Diagnosis not present

## 2017-02-01 MED ORDER — VITAMIN D (ERGOCALCIFEROL) 1.25 MG (50000 UNIT) PO CAPS: 50000.0000 [IU] | ORAL_CAPSULE | ORAL | 0 refills | Status: DC

## 2017-02-01 MED ORDER — METFORMIN HCL 500 MG PO TABS: 500.0000 mg | ORAL_TABLET | Freq: Every day | ORAL | 0 refills | Status: DC

## 2017-02-02 NOTE — Progress Notes (Signed)
Office: [phone]  /  Fax: [phone]   HPI:   Chief Complaint: OBESITY [name] is here to discuss her progress with her obesity treatment plan. She is on the Category 3 plan and is following her eating plan approximately 75 % of the time. She states she is exercising 5,000 steps per day 5 times per week. [name] got off track with her diet over the holidays. She noted increased emotional eating, but is ready to get back on track . [name] is bored with the category 3 plan. Her weight is 222 lb (100.7 kg) today and has had a weight gain of 4 pounds over a period of 5 weeks since her last visit. She has lost 8 lbs since starting treatment with [name].  Vitamin D deficiency [name] has a diagnosis of vitamin D deficiency. She is on prescription vit D and is not yet at goal. [name] denies nausea, vomiting or muscle weakness.   Ref. Range 11/05/2016 09:48  Vitamin D, 25-Hydroxy Latest Ref Range: 30.0 - 100.0 ng/mL 14.7 (L)   Pre-Diabetes Virgina has a diagnosis of pre-diabetes based on her elevated Hgb A1c and was informed this puts her at greater risk of developing diabetes. She is stable on metformin but she struggled to follow her diet prescription. She still notes polyphagia and denies nausea or hypoglycemia. [name] continues to work on diet and exercise to decrease risk of diabetes.   ALLERGIES: Allergies  Allergen Reactions  . Lisinopril Swelling and Other (See Comments)    Cough     MEDICATIONS: Current Outpatient Medications on File Prior to Visit  Medication Sig Dispense Refill  . amLODipine (NORVASC) 10 MG tablet Take 1 tablet (10 mg total) by mouth daily. 90 tablet 1  . escitalopram (LEXAPRO) 10 MG tablet Take 1 tablet (10 mg total) by mouth daily. 30 tablet 2  . furosemide (LASIX) 20 MG tablet Take 1 tablet (20 mg total) by mouth daily. 90 tablet 1  . LORazepam (ATIVAN) 1 MG tablet Take 1 tablet (1 mg total) by mouth at bedtime. 30 tablet 0  . metoprolol succinate (TOPROL XL) 50 MG  24 hr tablet 3 po qd 90 tablet 2  . potassium chloride (K-DUR) 10 MEQ tablet Take 1 tablet (10 mEq total) by mouth daily. 90 tablet 1   No current facility-administered medications on file prior to visit.     PAST MEDICAL HISTORY: Past Medical History:  Diagnosis Date  . Anemia   . Anxiety   . Arthritis   . Depression   . Fatigue   . Fibroids    uterine  . Hypertension   . Insulin resistance   . Joint pain   . Menorrhagia   . Pre-diabetes   . Umbilical hernia   . Vitamin D deficiency     PAST SURGICAL HISTORY: Past Surgical History:  Procedure Laterality Date  . CESAREAN SECTION    . CRYOTHERAPY  1989  . HYSTEROSCOPY W/D&C  06-20-2008  . [place] ABLATION  06-20-2008  . ROBOTIC ASSISTED TOTAL HYSTERECTOMY N/A 08/30/2014   Procedure: ROBOTIC ASSISTED ATTEMPTED, THEN CONVERSION TO OPEN TOTAL ABDOMINAL HYSTERECTOMY WITH BILATERAL SALPINGECTOMY;  Surgeon: Servando Salina, MD;  Location: [facility];  Service: Gynecology;  Laterality: N/A;  . TMJ ARTHROPLASTY  1998   has screws in both side of jaw per pt  . tubal ligation  06-20-2008  . UMBILICAL HERNIA REPAIR N/A 08/30/2014   Procedure: HERNIA REPAIR UMBILICAL ADULT;  Surgeon: Autumn Messing III, MD;  Location: [facility];  Service: General;  Laterality: N/A;    SOCIAL HISTORY: Social History   Tobacco Use  . Smoking status: Never Smoker  . Smokeless tobacco: Never Used  Substance Use Topics  . Alcohol use: No  . Drug use: No    FAMILY HISTORY: Family History  Problem Relation Age of Onset  . Breast cancer Mother   . Hypertension Mother   . Diabetes Mother   . Cancer Mother        breast  . Hyperlipidemia Mother   . Obesity Mother   . Hypertension Father   . Hyperlipidemia Father   . Anxiety disorder Father   . Breast cancer Maternal Aunt   . Cancer Maternal Aunt        beast    ROS: Review of Systems  Constitutional: Negative for weight loss.  Gastrointestinal: Negative for nausea and vomiting.    Musculoskeletal:       Negative muscle weakness  Endo/Heme/Allergies:       Positive polyphagia Negative hypoglycemia    PHYSICAL EXAM: Blood pressure (!) 140/98, pulse 75, temperature 98.3 F (36.8 C), temperature source Oral, height 5\' 6"  (1.676 m), weight 222 lb (100.7 kg), last menstrual period 08/17/2014, SpO2 99 %. Body mass index is 35.83 kg/m. Physical Exam  Constitutional: She is oriented to person, place, and time. She appears well-developed and well-nourished.  Cardiovascular: Normal rate.  Pulmonary/Chest: Effort normal.  Musculoskeletal: Normal range of motion.  Neurological: She is oriented to person, place, and time.  Skin: Skin is warm and dry.  Psychiatric: She has a normal mood and affect. Her behavior is normal.  Vitals reviewed.   RECENT LABS AND TESTS: BMET    Component Value Date/Time   NA 140 11/05/2016 0948   K 4.5 11/05/2016 0948   CL 102 11/05/2016 0948   CO2 24 11/05/2016 0948   GLUCOSE 87 11/05/2016 0948   GLUCOSE 86 10/05/2016 1703   BUN 7 11/05/2016 0948   CREATININE 0.58 11/05/2016 0948   CREATININE 0.81 05/01/2013 1105   CALCIUM 8.7 11/05/2016 0948   GFRNONAA 112 11/05/2016 0948   GFRAA 129 11/05/2016 0948   Lab Results  Component Value Date   HGBA1C 5.8 (H) 11/05/2016   Lab Results  Component Value Date   INSULIN 12.9 11/05/2016   CBC    Component Value Date/Time   WBC 8.1 11/05/2016 0948   WBC 8.9 10/05/2016 1703   RBC 4.31 11/05/2016 0948   RBC 4.36 10/05/2016 1703   HGB 13.4 11/05/2016 0948   HCT 40.1 11/05/2016 0948   PLT 449.0 (H) 10/05/2016 1703   MCV 93 11/05/2016 0948   MCH 31.1 11/05/2016 0948   MCH 27.2 08/31/2014 0440   MCHC 33.4 11/05/2016 0948   MCHC 33.0 10/05/2016 1703   RDW 14.5 11/05/2016 0948   LYMPHSABS 3.0 11/05/2016 0948   MONOABS 0.5 10/05/2016 1703   EOSABS 0.1 11/05/2016 0948   BASOSABS 0.0 11/05/2016 0948   Iron/TIBC/Ferritin/ %Sat No results found for: IRON, TIBC, FERRITIN,  IRONPCTSAT Lipid Panel     Component Value Date/Time   CHOL 168 11/05/2016 0948   TRIG 71 11/05/2016 0948   HDL 53 11/05/2016 0948   CHOLHDL 4 10/05/2016 1703   VLDL 21.8 10/05/2016 1703   LDLCALC 101 (H) 11/05/2016 0948   Hepatic Function Panel     Component Value Date/Time   PROT 6.5 11/05/2016 0948   ALBUMIN 4.0 11/05/2016 0948   AST 11 11/05/2016 0948   ALT 11 11/05/2016 0948  ALKPHOS 51 11/05/2016 0948   BILITOT 0.3 11/05/2016 0948      Component Value Date/Time   TSH 1.570 11/05/2016 0948   TSH 1.63 05/01/2016 1358   TSH 2.047 08/12/2012 0836     Ref. Range 11/05/2016 09:48  Vitamin D, 25-Hydroxy Latest Ref Range: 30.0 - 100.0 ng/mL 14.7 (L)    ASSESSMENT AND PLAN: Vitamin D deficiency - Plan: Vitamin D, Ergocalciferol, (DRISDOL) 50000 units CAPS capsule  Prediabetes - Plan: metFORMIN (GLUCOPHAGE) 500 MG tablet  Class 2 severe obesity with serious comorbidity and body mass index (BMI) of 35.0 to 35.9 in adult, unspecified obesity type ([place])  PLAN:  Vitamin D Deficiency Ares was informed that low vitamin D levels contributes to fatigue and are associated with obesity, breast, and colon cancer. She agrees to continue to take prescription Vit D @50 ,000 IU every week #4 with no refills. We will check labs at next visit and will follow up for routine testing of vitamin D, at least 2-3 times per year. She was informed of the risk of over-replacement of vitamin D and agrees to not increase her dose unless he discusses this with [name] first. [name] agrees to follow up with our clinic in 2 weeks.  Pre-Diabetes [name] will continue to work on weight loss, exercise, and decreasing simple carbohydrates in her diet to help decrease the risk of diabetes. We dicussed metformin including benefits and risks. She was informed that eating too many simple carbohydrates or too many calories at one sitting increases the likelihood of GI side effects. [name] requested metformin for now and a  prescription was written today for 1 month refill. We will check labs at next visit and [name] agreed to follow up with [name] as directed to monitor her progress.  Obesity [name] is currently in the action stage of change. As such, her goal is to continue with weight loss efforts She has agreed to change to follow a lower carbohydrate, vegetable and lean protein rich diet plan [name] has been instructed to work up to a goal of 150 minutes of combined cardio and strengthening exercise per week for weight loss and overall health benefits. We discussed the following Behavioral Modification Strategies today: increasing lean protein intake, decreasing simple carbohydrates  and work on meal planning and easy cooking plans  [name] has agreed to follow up with our clinic in 2 weeks fasting. She was informed of the importance of frequent follow up visits to maximize her success with intensive lifestyle modifications for her multiple health conditions.   OBESITY BEHAVIORAL INTERVENTION VISIT  Today's visit was # 5 out of 22.  Starting weight: 230 lbs Starting date: 11/05/16 Today's weight : 222 lbs Today's date: 02/01/2017 Total lbs lost to date: 8 (Patients must lose 7 lbs in the first 6 months to continue with counseling)   ASK: We discussed the diagnosis of obesity with Lyman Bishop today and [place] agreed to give [name] permission to discuss obesity behavioral modification therapy today.  ASSESS: [name] has the diagnosis of obesity and her BMI today is 35.85 Ersa is in the action stage of change   ADVISE: [name] was educated on the multiple health risks of obesity as well as the benefit of weight loss to improve her health. She was advised of the need for long term treatment and the importance of lifestyle modifications.  AGREE: Multiple dietary modification options and treatment options were discussed and  [name] agreed to the above obesity treatment plan.  Corey Skains, am  acting as  transcriptionist for Dennard Nip, MD  I have reviewed the above documentation for accuracy and completeness, and I agree with the above. -Dennard Nip, MD

## 2017-02-16 ENCOUNTER — Encounter (INDEPENDENT_AMBULATORY_CARE_PROVIDER_SITE_OTHER): Payer: Self-pay

## 2017-02-16 ENCOUNTER — Ambulatory Visit (INDEPENDENT_AMBULATORY_CARE_PROVIDER_SITE_OTHER): Payer: BC Managed Care – PPO | Admitting: Physician Assistant

## 2017-03-12 LAB — HM MAMMOGRAPHY

## 2017-03-17 ENCOUNTER — Encounter: Payer: Self-pay | Admitting: *Deleted

## 2017-04-05 ENCOUNTER — Encounter: Payer: Self-pay | Admitting: Family Medicine

## 2017-04-05 ENCOUNTER — Ambulatory Visit: Payer: BC Managed Care – PPO | Admitting: Family Medicine

## 2017-04-05 DIAGNOSIS — E559 Vitamin D deficiency, unspecified: Secondary | ICD-10-CM | POA: Diagnosis not present

## 2017-04-05 DIAGNOSIS — F329 Major depressive disorder, single episode, unspecified: Secondary | ICD-10-CM | POA: Diagnosis not present

## 2017-04-05 DIAGNOSIS — I10 Essential (primary) hypertension: Secondary | ICD-10-CM

## 2017-04-05 DIAGNOSIS — F32A Depression, unspecified: Secondary | ICD-10-CM

## 2017-04-05 DIAGNOSIS — F411 Generalized anxiety disorder: Secondary | ICD-10-CM

## 2017-04-05 DIAGNOSIS — E876 Hypokalemia: Secondary | ICD-10-CM | POA: Diagnosis not present

## 2017-04-05 DIAGNOSIS — R7303 Prediabetes: Secondary | ICD-10-CM | POA: Diagnosis not present

## 2017-04-05 MED ORDER — POTASSIUM CHLORIDE ER 10 MEQ PO TBCR: 10.0000 meq | EXTENDED_RELEASE_TABLET | Freq: Every day | ORAL | 1 refills | Status: DC

## 2017-04-05 MED ORDER — METOPROLOL SUCCINATE ER 50 MG PO TB24: ORAL_TABLET | ORAL | 2 refills | Status: DC

## 2017-04-05 MED ORDER — AMLODIPINE BESYLATE 10 MG PO TABS: 10.0000 mg | ORAL_TABLET | Freq: Every day | ORAL | 1 refills | Status: DC

## 2017-04-05 MED ORDER — VITAMIN D (ERGOCALCIFEROL) 1.25 MG (50000 UNIT) PO CAPS: 50000.0000 [IU] | ORAL_CAPSULE | ORAL | 0 refills | Status: DC

## 2017-04-05 MED ORDER — FUROSEMIDE 20 MG PO TABS: 20.0000 mg | ORAL_TABLET | Freq: Every day | ORAL | 1 refills | Status: DC

## 2017-04-05 MED ORDER — LORAZEPAM 1 MG PO TABS: 1.0000 mg | ORAL_TABLET | Freq: Every day | ORAL | 0 refills | Status: DC

## 2017-04-05 MED ORDER — ESCITALOPRAM OXALATE 10 MG PO TABS: 10.0000 mg | ORAL_TABLET | Freq: Every day | ORAL | 2 refills | Status: DC

## 2017-04-05 MED ORDER — METFORMIN HCL 500 MG PO TABS: 500.0000 mg | ORAL_TABLET | Freq: Every day | ORAL | 0 refills | Status: DC

## 2017-04-05 NOTE — Patient Instructions (Signed)

## 2017-04-05 NOTE — Progress Notes (Signed)
Subjective:  I acted as a Education administrator for [employer]. [name], [place]   Patient ID: [name], female    DOB: [birth year], 47 y.o.   MRN: [id number]  Chief Complaint  Patient presents with  . Follow-up    HPI  Patient is in today for follow up on essential hypertension.  Pt has missed several doses of medication. 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  She is very upset about this.     Patient Care Team: Carollee Herter, Alferd Apa, DO as PCP - General (Family Medicine)   Past Medical History:  Diagnosis Date  . Anemia   . Anxiety   . Arthritis   . Depression   . Fatigue   . Fibroids    uterine  . Hypertension   . Insulin resistance   . Joint pain   . Menorrhagia   . Pre-diabetes   . Umbilical hernia   . Vitamin D deficiency     Past Surgical History:  Procedure Laterality Date  . CESAREAN SECTION    . CRYOTHERAPY  1989  . HYSTEROSCOPY W/D&C  06-20-2008  . [place] [place] ABLATION  06-20-2008  . ROBOTIC ASSISTED TOTAL HYSTERECTOMY N/A 08/30/2014   Procedure: ROBOTIC ASSISTED ATTEMPTED, THEN CONVERSION TO OPEN TOTAL ABDOMINAL HYSTERECTOMY WITH BILATERAL SALPINGECTOMY;  Surgeon: Servando Salina, MD;  Location: [facility];  Service: Gynecology;  Laterality: N/A;  . TMJ ARTHROPLASTY  1998   has screws in both side of jaw per pt  . tubal ligation  06-20-2008  . UMBILICAL HERNIA REPAIR N/A 08/30/2014   Procedure: HERNIA REPAIR UMBILICAL ADULT;  Surgeon: Autumn Messing III, MD;  Location: [facility];  Service: General;  Laterality: N/A;    Family History  Problem Relation Age of Onset  . Breast cancer Mother   . Hypertension Mother   . Diabetes Mother   . Cancer Mother        breast  . Hyperlipidemia Mother   . Obesity Mother   . Hypertension Father   . Hyperlipidemia Father   . Anxiety disorder Father   . Breast cancer Maternal Aunt   . Cancer Maternal Aunt        beast    Social History   Socioeconomic History  . Marital  status: Divorced    Spouse name: Not on file  . Number of children: 2  . Years of education: Not on file  . Highest education level: Not on file  Social Needs  . Financial resource strain: Not on file  . Food insecurity - worry: Not on file  . Food insecurity - inability: Not on file  . Transportation needs - medical: Not on file  . Transportation needs - non-medical: Not on file  Occupational History  . Occupation: Product manager: [employer]  Tobacco Use  . Smoking status: Never Smoker  . Smokeless tobacco: Never Used  Substance and Sexual Activity  . Alcohol use: No  . Drug use: No  . Sexual activity: Yes    Birth control/protection: Surgical  Other Topics Concern  . Not on file  Social History Narrative  . Not on file    Outpatient Medications Prior to Visit  Medication Sig Dispense Refill  . amLODipine (NORVASC) 10 MG tablet Take 1 tablet (10 mg total) by mouth daily. 90 tablet 1  . escitalopram (LEXAPRO) 10 MG tablet Take 1 tablet (10 mg total) by mouth  daily. 30 tablet 2  . furosemide (LASIX) 20 MG tablet Take 1 tablet (20 mg total) by mouth daily. 90 tablet 1  . LORazepam (ATIVAN) 1 MG tablet Take 1 tablet (1 mg total) by mouth at bedtime. 30 tablet 0  . metFORMIN (GLUCOPHAGE) 500 MG tablet Take 1 tablet (500 mg total) by mouth daily with breakfast. 30 tablet 0  . metoprolol succinate (TOPROL XL) 50 MG 24 hr tablet 3 po qd 90 tablet 2  . potassium chloride (K-DUR) 10 MEQ tablet Take 1 tablet (10 mEq total) by mouth daily. 90 tablet 1  . Vitamin D, Ergocalciferol, (DRISDOL) 50000 units CAPS capsule Take 1 capsule (50,000 Units total) by mouth every 7 (seven) days. 4 capsule 0   No facility-administered medications prior to visit.     Allergies  Allergen Reactions  . Lisinopril Swelling and Other (See Comments)    Cough     Review of Systems  Constitutional: Negative for chills, fever and malaise/fatigue.  HENT: Negative for congestion and  hearing loss.   Eyes: Negative for discharge.  Respiratory: Negative for cough, sputum production and shortness of breath.   Cardiovascular: Negative for chest pain, palpitations and leg swelling.  Gastrointestinal: Negative for abdominal pain, blood in stool, constipation, diarrhea, heartburn, nausea and vomiting.  Genitourinary: Negative for dysuria, frequency, hematuria and urgency.  Musculoskeletal: Negative for back pain, falls and myalgias.  Skin: Negative for rash.  Neurological: Negative for dizziness, sensory change, loss of consciousness, weakness and headaches.  Endo/Heme/Allergies: Negative for environmental allergies. Does not bruise/bleed easily.  Psychiatric/Behavioral: Negative for depression and suicidal ideas. The patient is not nervous/anxious and does not have insomnia.        Objective:    Physical Exam  Constitutional: She is oriented to person, place, and time. She appears well-developed and well-nourished.  HENT:  Head: Normocephalic and atraumatic.  Eyes: Conjunctivae and EOM are normal.  Neck: Normal range of motion. Neck supple. No JVD present. Carotid bruit is not present. No thyromegaly present.  Cardiovascular: Normal rate, regular rhythm and normal heart sounds.  No murmur heard. Pulmonary/Chest: Effort normal and breath sounds normal. No respiratory distress. She has no wheezes. She has no rales. She exhibits no tenderness.  Musculoskeletal: She exhibits no edema.  Neurological: She is alert and oriented to person, place, and time.  Psychiatric: She has a normal mood and affect.  Nursing note and vitals reviewed.   BP (!) 154/86 (BP Location: Left Arm, Patient Position: Sitting, Cuff Size: Large)   Pulse 83   Temp 98 F (36.7 C) (Oral)   Resp 16   Ht 5' 6.14" (1.68 m)   Wt 230 lb (104.3 kg)   LMP 08/17/2014 (Exact Date)   SpO2 98%   BMI 36.96 kg/m  Wt Readings from Last 3 Encounters:  04/05/17 230 lb (104.3 kg)  02/01/17 222 lb (100.7 kg)    12/23/16 218 lb (98.9 kg)   BP Readings from Last 3 Encounters:  04/05/17 (!) 154/86  02/01/17 (!) 140/98  12/23/16 (!) 142/89     Immunization History  Administered Date(s) Administered  . Hepatitis B, adult 09/06/2013, 10/11/2013, 05/01/2016  . MMRV 09/12/2013  . Tdap 09/06/2013    Health Maintenance  Topic Date Due  . HIV Screening  03/03/1986  . INFLUENZA VACCINE  04/25/2017 (Originally 08/26/2016)  . PAP SMEAR  05/26/2017  . MAMMOGRAM  03/12/2018  . TETANUS/TDAP  09/07/2023    Lab Results  Component Value Date   WBC 8.1  11/05/2016   HGB 13.4 11/05/2016   HCT 40.1 11/05/2016   PLT 449.0 (H) 10/05/2016   GLUCOSE 87 11/05/2016   CHOL 168 11/05/2016   TRIG 71 11/05/2016   HDL 53 11/05/2016   LDLCALC 101 (H) 11/05/2016   ALT 11 11/05/2016   AST 11 11/05/2016   NA 140 11/05/2016   K 4.5 11/05/2016   CL 102 11/05/2016   CREATININE 0.58 11/05/2016   BUN 7 11/05/2016   CO2 24 11/05/2016   TSH 1.570 11/05/2016   HGBA1C 5.8 (H) 11/05/2016   MICROALBUR 0.6 05/01/2016    Lab Results  Component Value Date   TSH 1.570 11/05/2016   Lab Results  Component Value Date   WBC 8.1 11/05/2016   HGB 13.4 11/05/2016   HCT 40.1 11/05/2016   MCV 93 11/05/2016   PLT 449.0 (H) 10/05/2016   Lab Results  Component Value Date   NA 140 11/05/2016   K 4.5 11/05/2016   CO2 24 11/05/2016   GLUCOSE 87 11/05/2016   BUN 7 11/05/2016   CREATININE 0.58 11/05/2016   BILITOT 0.3 11/05/2016   ALKPHOS 51 11/05/2016   AST 11 11/05/2016   ALT 11 11/05/2016   PROT 6.5 11/05/2016   ALBUMIN 4.0 11/05/2016   CALCIUM 8.7 11/05/2016   ANIONGAP 7 08/31/2014   GFR 128.71 10/05/2016   Lab Results  Component Value Date   CHOL 168 11/05/2016   Lab Results  Component Value Date   HDL 53 11/05/2016   Lab Results  Component Value Date   LDLCALC 101 (H) 11/05/2016   Lab Results  Component Value Date   TRIG 71 11/05/2016   Lab Results  Component Value Date   CHOLHDL 4 10/05/2016    Lab Results  Component Value Date   HGBA1C 5.8 (H) 11/05/2016         Assessment & Plan:   Problem List Items Addressed This Visit      Unprioritized   Depression   Relevant Medications   LORazepam (ATIVAN) 1 MG tablet   escitalopram (LEXAPRO) 10 MG tablet   Essential hypertension    Poorly controlled will alter medications, encouraged DASH diet, minimize caffeine and obtain adequate sleep. Report concerning symptoms and follow up as directed and as needed--- pt has missed sep doses of meds  Take meds daily and f/u 2-3 weeks      Relevant Medications   amLODipine (NORVASC) 10 MG tablet   furosemide (LASIX) 20 MG tablet   metoprolol succinate (TOPROL XL) 50 MG 24 hr tablet   Generalized anxiety disorder   Relevant Medications   LORazepam (ATIVAN) 1 MG tablet   escitalopram (LEXAPRO) 10 MG tablet   Hypokalemia   Relevant Medications   potassium chloride (K-DUR) 10 MEQ tablet   Prediabetes   Relevant Medications   metFORMIN (GLUCOPHAGE) 500 MG tablet   Vitamin D deficiency   Relevant Medications   Vitamin D, Ergocalciferol, (DRISDOL) 50000 units CAPS capsule      I am having Rodena Piety D. Natzke maintain her amLODipine, Vitamin D (Ergocalciferol), potassium chloride, LORazepam, furosemide, escitalopram, metoprolol succinate, and metFORMIN.  Meds ordered this encounter  Medications  . amLODipine (NORVASC) 10 MG tablet    Sig: Take 1 tablet (10 mg total) by mouth daily.    Dispense:  90 tablet    Refill:  1  . Vitamin D, Ergocalciferol, (DRISDOL) 50000 units CAPS capsule    Sig: Take 1 capsule (50,000 Units total) by mouth every 7 (seven) days.  Dispense:  4 capsule    Refill:  0  . potassium chloride (K-DUR) 10 MEQ tablet    Sig: Take 1 tablet (10 mEq total) by mouth daily.    Dispense:  90 tablet    Refill:  1  . LORazepam (ATIVAN) 1 MG tablet    Sig: Take 1 tablet (1 mg total) by mouth at bedtime.    Dispense:  30 tablet    Refill:  0  . furosemide (LASIX)  20 MG tablet    Sig: Take 1 tablet (20 mg total) by mouth daily.    Dispense:  90 tablet    Refill:  1  . escitalopram (LEXAPRO) 10 MG tablet    Sig: Take 1 tablet (10 mg total) by mouth daily.    Dispense:  30 tablet    Refill:  2  . metoprolol succinate (TOPROL XL) 50 MG 24 hr tablet    Sig: 3 po qd    Dispense:  90 tablet    Refill:  2  . metFORMIN (GLUCOPHAGE) 500 MG tablet    Sig: Take 1 tablet (500 mg total) by mouth daily with breakfast.    Dispense:  30 tablet    Refill:  0    CMA served as scribe during this visit. History, Physical and Plan performed by medical provider. Documentation and orders reviewed and attested to.  Ann Held, DO

## 2017-04-05 NOTE — Assessment & Plan Note (Signed)
Poorly controlled will alter medications, encouraged DASH diet, minimize caffeine and obtain adequate sleep. Report concerning symptoms and follow up as directed and as needed--- pt has missed sep doses of meds  Take meds daily and f/u 2-3 weeks

## 2017-04-28 ENCOUNTER — Ambulatory Visit: Payer: BC Managed Care – PPO

## 2017-05-20 ENCOUNTER — Ambulatory Visit (INDEPENDENT_AMBULATORY_CARE_PROVIDER_SITE_OTHER): Payer: BC Managed Care – PPO

## 2017-05-20 ENCOUNTER — Telehealth: Payer: Self-pay | Admitting: *Deleted

## 2017-05-20 VITALS — BP 150/98 | HR 77

## 2017-05-20 DIAGNOSIS — I10 Essential (primary) hypertension: Secondary | ICD-10-CM | POA: Diagnosis not present

## 2017-05-20 MED ORDER — METOPROLOL SUCCINATE ER 200 MG PO TB24: 200.0000 mg | ORAL_TABLET | Freq: Every day | ORAL | 1 refills | Status: DC

## 2017-05-20 MED ORDER — LEVOCETIRIZINE DIHYDROCHLORIDE 5 MG PO TABS: 5.0000 mg | ORAL_TABLET | Freq: Every evening | ORAL | 1 refills | Status: DC

## 2017-05-20 MED FILL — LEVOCETIRIZINE 5 MG TABLET: 5 | 90 days supply | Qty: 90 | Fill #0

## 2017-05-20 MED FILL — METOPROLOL SUCCINATE ER 200: 200 | 90 days supply | Qty: 90 | Fill #0

## 2017-05-20 NOTE — Progress Notes (Signed)
Patient here today for a blood pressure check. She currently takes amlodipine 10mg  daily   Missed doses: None   Last dosage: Today    Blood Pressure: 152/102  Pulse:82    Second check: 150/98 Pulse: 77  Add: Toprol-xl 200 once a day added Take lasix everyday.    Patient was advised by PCP to ad 50 mg of Toprol, making it 200mg  daily.  Sent rx to pharmacy patient made aware.  Patient has appointment with PCP next month to follow up

## 2017-05-20 NOTE — Telephone Encounter (Signed)
Patient came in for BP check and also asked about this at her appointment  Copied from [place]. Topic: General - Other >> May 20, 2017 10:15 AM Yvette Rack wrote: Reason for CRM: pt calling stating that she has allergies due to the pollen runny eyes and sneezing and would like a RX called into her pharmacy

## 2017-05-20 NOTE — Patient Instructions (Addendum)
Take your lasix everyday with  Amlodipine 10mg  daily  Add Metoprolol 200mg  Daily   Hypertension Hypertension, commonly called high blood pressure, is when the force of blood pumping through the arteries is too strong. The arteries are the blood vessels that carry blood from the heart throughout the body. Hypertension forces the heart to work harder to pump blood and may cause arteries to become narrow or stiff. Having untreated or uncontrolled hypertension can cause heart attacks, strokes, kidney disease, and other problems. A blood pressure reading consists of a higher number over a lower number. Ideally, your blood pressure should be below 120/80. The first ("top") number is called the systolic pressure. It is a measure of the pressure in your arteries as your heart beats. The second ("bottom") number is called the diastolic pressure. It is a measure of the pressure in your arteries as the heart relaxes. What are the causes? The cause of this condition is not known. What increases the risk? Some risk factors for high blood pressure are under your control. Others are not. Factors you can change  Smoking.  Having type 2 diabetes mellitus, high cholesterol, or both.  Not getting enough exercise or physical activity.  Being overweight.  Having too much fat, sugar, calories, or salt (sodium) in your diet.  Drinking too much alcohol. Factors that are difficult or impossible to change  Having chronic kidney disease.  Having a family history of high blood pressure.  Age. Risk increases with age.  Race. You may be at higher risk if you are African-American.  Gender. Men are at higher risk than women before age 53. After age 28, women are at higher risk than men.  Having obstructive sleep apnea.  Stress. What are the signs or symptoms? Extremely high blood pressure (hypertensive crisis) may cause:  Headache.  Anxiety.  Shortness of breath.  Nosebleed.  Nausea and  vomiting.  Severe chest pain.  Jerky movements you cannot control (seizures).  How is this diagnosed? This condition is diagnosed by measuring your blood pressure while you are seated, with your arm resting on a surface. The cuff of the blood pressure monitor will be placed directly against the skin of your upper arm at the level of your heart. It should be measured at least twice using the same arm. Certain conditions can cause a difference in blood pressure between your right and left arms. Certain factors can cause blood pressure readings to be lower or higher than normal (elevated) for a short period of time:  When your blood pressure is higher when you are in a health care provider's office than when you are at home, this is called white coat hypertension. Most people with this condition do not need medicines.  When your blood pressure is higher at home than when you are in a health care provider's office, this is called masked hypertension. Most people with this condition may need medicines to control blood pressure.  If you have a high blood pressure reading during one visit or you have normal blood pressure with other risk factors:  You may be asked to return on a different day to have your blood pressure checked again.  You may be asked to monitor your blood pressure at home for 1 week or longer.  If you are diagnosed with hypertension, you may have other blood or imaging tests to help your health care provider understand your overall risk for other conditions. How is this treated? This condition is treated by making  healthy lifestyle changes, such as eating healthy foods, exercising more, and reducing your alcohol intake. Your health care provider may prescribe medicine if lifestyle changes are not enough to get your blood pressure under control, and if:  Your systolic blood pressure is above 130.  Your diastolic blood pressure is above 80.  Your personal target blood pressure  may vary depending on your medical conditions, your age, and other factors. Follow these instructions at home: Eating and drinking  Eat a diet that is high in fiber and potassium, and low in sodium, added sugar, and fat. An example eating plan is called the DASH (Dietary Approaches to Stop Hypertension) diet. To eat this way: ? Eat plenty of fresh fruits and vegetables. Try to fill half of your plate at each meal with fruits and vegetables. ? Eat whole grains, such as whole wheat pasta, brown rice, or whole grain bread. Fill about one quarter of your plate with whole grains. ? Eat or drink low-fat dairy products, such as skim milk or low-fat yogurt. ? Avoid fatty cuts of meat, processed or cured meats, and poultry with skin. Fill about one quarter of your plate with lean proteins, such as fish, chicken without skin, beans, eggs, and tofu. ? Avoid premade and processed foods. These tend to be higher in sodium, added sugar, and fat.  Reduce your daily sodium intake. Most people with hypertension should eat less than 1,500 mg of sodium a day.  Limit alcohol intake to no more than 1 drink a day for nonpregnant women and 2 drinks a day for men. One drink equals 12 oz of beer, 5 oz of wine, or 1 oz of hard liquor. Lifestyle  Work with your health care provider to maintain a healthy body weight or to lose weight. Ask what an ideal weight is for you.  Get at least 30 minutes of exercise that causes your heart to beat faster (aerobic exercise) most days of the week. Activities may include walking, swimming, or biking.  Include exercise to strengthen your muscles (resistance exercise), such as pilates or lifting weights, as part of your weekly exercise routine. Try to do these types of exercises for 30 minutes at least 3 days a week.  Do not use any products that contain nicotine or tobacco, such as cigarettes and e-cigarettes. If you need help quitting, ask your health care provider.  Monitor your  blood pressure at home as told by your health care provider.  Keep all follow-up visits as told by your health care provider. This is important. Medicines  Take over-the-counter and prescription medicines only as told by your health care provider. Follow directions carefully. Blood pressure medicines must be taken as prescribed.  Do not skip doses of blood pressure medicine. Doing this puts you at risk for problems and can make the medicine less effective.  Ask your health care provider about side effects or reactions to medicines that you should watch for. Contact a health care provider if:  You think you are having a reaction to a medicine you are taking.  You have headaches that keep coming back (recurring).  You feel dizzy.  You have swelling in your ankles.  You have trouble with your vision. Get help right away if:  You develop a severe headache or confusion.  You have unusual weakness or numbness.  You feel faint.  You have severe pain in your chest or abdomen.  You vomit repeatedly.  You have trouble breathing. Summary  Hypertension is when  the force of blood pumping through your arteries is too strong. If this condition is not controlled, it may put you at risk for serious complications.  Your personal target blood pressure may vary depending on your medical conditions, your age, and other factors. For most people, a normal blood pressure is less than 120/80.  Hypertension is treated with lifestyle changes, medicines, or a combination of both. Lifestyle changes include weight loss, eating a healthy, low-sodium diet, exercising more, and limiting alcohol. This information is not intended to replace advice given to you by your health care provider. Make sure you discuss any questions you have with your health care provider. Document Released: 01/12/2005 Document Revised: 12/11/2015 Document Reviewed: 12/11/2015 Elsevier Interactive Patient Education  2018 [employer]  

## 2017-05-21 ENCOUNTER — Ambulatory Visit: Payer: BC Managed Care – PPO | Admitting: Family Medicine

## 2017-06-01 ENCOUNTER — Ambulatory Visit (INDEPENDENT_AMBULATORY_CARE_PROVIDER_SITE_OTHER): Payer: BC Managed Care – PPO | Admitting: Physician Assistant

## 2017-06-01 VITALS — BP 112/75 | HR 86 | Temp 99.1°F | Ht 66.0 in | Wt 223.0 lb

## 2017-06-01 DIAGNOSIS — E559 Vitamin D deficiency, unspecified: Secondary | ICD-10-CM | POA: Diagnosis not present

## 2017-06-01 DIAGNOSIS — Z9189 Other specified personal risk factors, not elsewhere classified: Secondary | ICD-10-CM | POA: Diagnosis not present

## 2017-06-01 DIAGNOSIS — Z6836 Body mass index (BMI) 36.0-36.9, adult: Secondary | ICD-10-CM

## 2017-06-01 DIAGNOSIS — R7303 Prediabetes: Secondary | ICD-10-CM | POA: Diagnosis not present

## 2017-06-01 DIAGNOSIS — F3289 Other specified depressive episodes: Secondary | ICD-10-CM

## 2017-06-01 DIAGNOSIS — E66812 Obesity, class 2: Secondary | ICD-10-CM

## 2017-06-02 ENCOUNTER — Other Ambulatory Visit (INDEPENDENT_AMBULATORY_CARE_PROVIDER_SITE_OTHER): Payer: Self-pay | Admitting: Physician Assistant

## 2017-06-02 DIAGNOSIS — R7303 Prediabetes: Secondary | ICD-10-CM

## 2017-06-02 DIAGNOSIS — E559 Vitamin D deficiency, unspecified: Secondary | ICD-10-CM

## 2017-06-02 MED ORDER — METFORMIN HCL 500 MG PO TABS
500.0000 mg | ORAL_TABLET | Freq: Every day | ORAL | 0 refills | Status: DC
Start: 2017-06-02 — End: 2017-07-15

## 2017-06-02 MED ORDER — BUPROPION HCL ER (SR) 150 MG PO TB12: 150.0000 mg | ORAL_TABLET | Freq: Every day | ORAL | 0 refills | Status: DC

## 2017-06-02 MED ORDER — VITAMIN D (ERGOCALCIFEROL) 1.25 MG (50000 UNIT) PO CAPS: 50000.0000 [IU] | ORAL_CAPSULE | ORAL | 0 refills | Status: DC

## 2017-06-02 NOTE — Progress Notes (Signed)
Office: [phone]  /  Fax: [phone]   HPI:   Chief Complaint: OBESITY [name] is here to discuss her progress with her obesity treatment plan. She is on the lower carbohydrate, vegetable and lean protein rich diet plan and is following her eating plan approximately 30 % of the time. She states she is exercising 0 minutes 0 times per week. [name]'s last follow up appointment was on 02-01-17 and she states, she had increased work related stress and found it hard to stay on track with her eating. She has noticed an increase in cravings. Her weight is 223 lb (101.2 kg) today and has had a weight gain of 1 pound over a period of 4 months since her last visit. She has lost 7 lbs since starting treatment with [name].  Vitamin D deficiency [name] has a diagnosis of vitamin D deficiency. She is currently taking vit D and denies nausea, vomiting or muscle weakness.  Pre-Diabetes [place] has a diagnosis of prediabetes based on her elevated Hgb A1c and was informed this puts her at greater risk of developing diabetes. She is taking metformin currently and continues to work on diet and exercise to decrease risk of diabetes. She denies nausea, polyphagia or hypoglycemia.   Depression with emotional eating behaviors [name] is struggling with emotional eating and using food for comfort to the extent that it is negatively impacting her health. She often snacks when she is not hungry. [name] sometimes feels she is out of control and then feels guilty that she made poor food choices. She has been working on behavior modification techniques to help reduce her emotional eating and has been somewhat successful. She shows no sign of suicidal or homicidal ideations.  Depression screen PHQ 2/9 11/05/2016  Decreased Interest 0  Down, Depressed, Hopeless 1  PHQ - 2 Score 1  Altered sleeping 0  Tired, decreased energy 2  Change in appetite 2  Feeling bad or failure about yourself  0  Trouble concentrating 0  Moving  slowly or fidgety/restless 0  Suicidal thoughts 0  PHQ-9 Score 5  Difficult doing work/chores Not difficult at all     At risk for diabetes [name] is at higher than average risk for developing diabetes due to her obesity and pre-diabetes. She currently denies polyuria or polydipsia.  ALLERGIES: Allergies  Allergen Reactions  . Lisinopril Swelling and Other (See Comments)    Cough     MEDICATIONS: Current Outpatient Medications on File Prior to Visit  Medication Sig Dispense Refill  . amLODipine (NORVASC) 10 MG tablet Take 1 tablet (10 mg total) by mouth daily. 90 tablet 1  . escitalopram (LEXAPRO) 10 MG tablet Take 1 tablet (10 mg total) by mouth daily. 30 tablet 2  . furosemide (LASIX) 20 MG tablet Take 1 tablet (20 mg total) by mouth daily. 90 tablet 1  . levocetirizine (XYZAL) 5 MG tablet Take 1 tablet (5 mg total) by mouth every evening. 90 tablet 1  . LORazepam (ATIVAN) 1 MG tablet Take 1 tablet (1 mg total) by mouth at bedtime. 30 tablet 0  . metFORMIN (GLUCOPHAGE) 500 MG tablet Take 1 tablet (500 mg total) by mouth daily with breakfast. 30 tablet 0  . metoprolol (TOPROL-XL) 200 MG 24 hr tablet Take 1 tablet (200 mg total) by mouth daily. 90 tablet 1  . potassium chloride (K-DUR) 10 MEQ tablet Take 1 tablet (10 mEq total) by mouth daily. 90 tablet 1  . Vitamin D, Ergocalciferol, (DRISDOL) 50000 units CAPS capsule Take  1 capsule (50,000 Units total) by mouth every 7 (seven) days. 4 capsule 0   No current facility-administered medications on file prior to visit.     PAST MEDICAL HISTORY: Past Medical History:  Diagnosis Date  . Anemia   . Anxiety   . Arthritis   . Depression   . Fatigue   . Fibroids    uterine  . Hypertension   . Insulin resistance   . Joint pain   . Menorrhagia   . Pre-diabetes   . Umbilical hernia   . Vitamin D deficiency     PAST SURGICAL HISTORY: Past Surgical History:  Procedure Laterality Date  . CESAREAN SECTION    . CRYOTHERAPY   1989  . HYSTEROSCOPY W/D&C  06-20-2008  . [place] ABLATION  06-20-2008  . ROBOTIC ASSISTED TOTAL HYSTERECTOMY N/A 08/30/2014   Procedure: ROBOTIC ASSISTED ATTEMPTED, THEN CONVERSION TO OPEN TOTAL ABDOMINAL HYSTERECTOMY WITH BILATERAL SALPINGECTOMY;  Surgeon: Servando Salina, MD;  Location: [facility];  Service: Gynecology;  Laterality: N/A;  . TMJ ARTHROPLASTY  1998   has screws in both side of jaw per pt  . tubal ligation  06-20-2008  . UMBILICAL HERNIA REPAIR N/A 08/30/2014   Procedure: HERNIA REPAIR UMBILICAL ADULT;  Surgeon: Autumn Messing III, MD;  Location: [facility];  Service: General;  Laterality: N/A;    SOCIAL HISTORY: Social History   Tobacco Use  . Smoking status: Never Smoker  . Smokeless tobacco: Never Used  Substance Use Topics  . Alcohol use: No  . Drug use: No    FAMILY HISTORY: Family History  Problem Relation Age of Onset  . Breast cancer Mother   . Hypertension Mother   . Diabetes Mother   . Cancer Mother        breast  . Hyperlipidemia Mother   . Obesity Mother   . Hypertension Father   . Hyperlipidemia Father   . Anxiety disorder Father   . Breast cancer Maternal Aunt   . Cancer Maternal Aunt        beast    ROS: Review of Systems  Constitutional: Negative for weight loss.  Gastrointestinal: Negative for nausea and vomiting.  Genitourinary: Negative for frequency.  Musculoskeletal:       Negative for muscle weakness  Endo/Heme/Allergies: Negative for polydipsia.       Negative for polyphagia Negative for hypoglycemia    PHYSICAL EXAM: Blood pressure 112/75, pulse 86, temperature 99.1 F (37.3 C), temperature source Oral, height 5\' 6"  (1.676 m), weight 223 lb (101.2 kg), last menstrual period 08/17/2014, SpO2 98 %. Body mass index is 35.99 kg/m. Physical Exam  Constitutional: She is oriented to person, place, and time. She appears well-developed and well-nourished.  Cardiovascular: Normal rate.  Pulmonary/Chest: Effort normal.  Musculoskeletal:  Normal range of motion.  Neurological: She is oriented to person, place, and time.  Skin: Skin is warm and dry.  Psychiatric: She has a normal mood and affect. Her behavior is normal.  Vitals reviewed.   RECENT LABS AND TESTS: BMET    Component Value Date/Time   NA 140 11/05/2016 0948   K 4.5 11/05/2016 0948   CL 102 11/05/2016 0948   CO2 24 11/05/2016 0948   GLUCOSE 87 11/05/2016 0948   GLUCOSE 86 10/05/2016 1703   BUN 7 11/05/2016 0948   CREATININE 0.58 11/05/2016 0948   CREATININE 0.81 05/01/2013 1105   CALCIUM 8.7 11/05/2016 0948   GFRNONAA 112 11/05/2016 0948   GFRAA 129 11/05/2016 0948   Lab Results  Component Value Date   HGBA1C 5.8 (H) 11/05/2016   Lab Results  Component Value Date   INSULIN 12.9 11/05/2016   CBC    Component Value Date/Time   WBC 8.1 11/05/2016 0948   WBC 8.9 10/05/2016 1703   RBC 4.31 11/05/2016 0948   RBC 4.36 10/05/2016 1703   HGB 13.4 11/05/2016 0948   HCT 40.1 11/05/2016 0948   PLT 449.0 (H) 10/05/2016 1703   MCV 93 11/05/2016 0948   MCH 31.1 11/05/2016 0948   MCH 27.2 08/31/2014 0440   MCHC 33.4 11/05/2016 0948   MCHC 33.0 10/05/2016 1703   RDW 14.5 11/05/2016 0948   LYMPHSABS 3.0 11/05/2016 0948   MONOABS 0.5 10/05/2016 1703   EOSABS 0.1 11/05/2016 0948   BASOSABS 0.0 11/05/2016 0948   Iron/TIBC/Ferritin/ %Sat No results found for: IRON, TIBC, FERRITIN, IRONPCTSAT Lipid Panel     Component Value Date/Time   CHOL 168 11/05/2016 0948   TRIG 71 11/05/2016 0948   HDL 53 11/05/2016 0948   CHOLHDL 4 10/05/2016 1703   VLDL 21.8 10/05/2016 1703   LDLCALC 101 (H) 11/05/2016 0948   Hepatic Function Panel     Component Value Date/Time   PROT 6.5 11/05/2016 0948   ALBUMIN 4.0 11/05/2016 0948   AST 11 11/05/2016 0948   ALT 11 11/05/2016 0948   ALKPHOS 51 11/05/2016 0948   BILITOT 0.3 11/05/2016 0948      Component Value Date/Time   TSH 1.570 11/05/2016 0948   TSH 1.63 05/01/2016 1358   TSH 2.047 08/12/2012 0836    Results for SHATIQUA, HEROUX (MRN [id number]) as of 06/02/2017 08:50  Ref. Range 11/05/2016 09:48  Vitamin D, 25-Hydroxy Latest Ref Range: 30.0 - 100.0 ng/mL 14.7 (L)   ASSESSMENT AND PLAN: Prediabetes - Plan: metFORMIN (GLUCOPHAGE) 500 MG tablet  Vitamin D deficiency - Plan: Vitamin D, Ergocalciferol, (DRISDOL) 50000 units CAPS capsule  Other depression - with emotional eating  At risk for diabetes mellitus  Class 2 severe obesity with serious comorbidity and body mass index (BMI) of 36.0 to 36.9 in adult, unspecified obesity type ([place])  PLAN:  Vitamin D Deficiency [name] was informed that low vitamin D levels contributes to fatigue and are associated with obesity, breast, and colon cancer. She agrees to continue to take prescription Vit D @50 ,000 IU every week #4 with no refills and will follow up for routine testing of vitamin D, at least 2-3 times per year. She was informed of the risk of over-replacement of vitamin D and agrees to not increase her dose unless she discusses this with [name] first. [name] agrees to follow up with our clinic in 3 weeks.  Pre-Diabetes [name] will continue to work on weight loss, exercise, and decreasing simple carbohydrates in her diet to help decrease the risk of diabetes. We dicussed metformin including benefits and risks. She was informed that eating too many simple carbohydrates or too many calories at one sitting increases the likelihood of GI side effects. [name] agreed to continue  Metformin 500 mg qd #30 with no refills and follow up with [name] as directed to monitor her progress.  Depression with Emotional Eating Behaviors We discussed behavior modification techniques today to help [name] deal with her emotional eating and depression. She has agreed to take Wellbutrin SR 150 mg qd and agreed to follow up as directed.   Diabetes risk counseling [name] was given extended (15 minutes) diabetes prevention counseling today. She is 47 y.o. female and has risk factors  for  diabetes including obesity and pre-diabetes. We discussed intensive lifestyle modifications today with an emphasis on weight loss as well as increasing exercise and decreasing simple carbohydrates in her diet.  Obesity [name] is currently in the action stage of change. As such, her goal is to continue with weight loss efforts She has agreed to follow the Category 3 plan [place] has been instructed to work up to a goal of 150 minutes of combined cardio and strengthening exercise per week for weight loss and overall health benefits. We discussed the following Behavioral Modification Strategies today: increasing lean protein intake and work on meal planning and easy cooking plans  [name] has agreed to follow up with our clinic in 3 weeks. She was informed of the importance of frequent follow up visits to maximize her success with intensive lifestyle modifications for her multiple health conditions.    OBESITY BEHAVIORAL INTERVENTION VISIT  Today's visit was # 6 out of 22.  Starting weight: 230 lbs Starting date: 11/05/16 Today's weight : 223 lbs Today's date: 06/01/2017 Total lbs lost to date: 7 (Patients must lose 7 lbs in the first 6 months to continue with counseling)   ASK: We discussed the diagnosis of obesity with Lyman Bishop today and Merlene agreed to give [name] permission to discuss obesity behavioral modification therapy today.  ASSESS: [name] has the diagnosis of obesity and her BMI today is 36.01 [name] is in the action stage of change   ADVISE: [name] was educated on the multiple health risks of obesity as well as the benefit of weight loss to improve her health. She was advised of the need for long term treatment and the importance of lifestyle modifications.  AGREE: Multiple dietary modification options and treatment options were discussed and  [name] agreed to the above obesity treatment plan.   Corey Skains, am acting as transcriptionist for [employer], PA-C I, Lacy Duverney [facility], have reviewed this note and agree with its content

## 2017-06-22 ENCOUNTER — Ambulatory Visit (INDEPENDENT_AMBULATORY_CARE_PROVIDER_SITE_OTHER): Payer: BC Managed Care – PPO | Admitting: Physician Assistant

## 2017-06-22 ENCOUNTER — Other Ambulatory Visit (INDEPENDENT_AMBULATORY_CARE_PROVIDER_SITE_OTHER): Payer: Self-pay | Admitting: Physician Assistant

## 2017-06-22 VITALS — BP 119/76 | HR 76 | Temp 98.1°F | Ht 66.0 in | Wt 225.0 lb

## 2017-06-22 DIAGNOSIS — Z6836 Body mass index (BMI) 36.0-36.9, adult: Secondary | ICD-10-CM

## 2017-06-22 DIAGNOSIS — E559 Vitamin D deficiency, unspecified: Secondary | ICD-10-CM | POA: Diagnosis not present

## 2017-06-22 DIAGNOSIS — Z9189 Other specified personal risk factors, not elsewhere classified: Secondary | ICD-10-CM

## 2017-06-22 DIAGNOSIS — F3289 Other specified depressive episodes: Secondary | ICD-10-CM | POA: Diagnosis not present

## 2017-06-22 MED ORDER — BUPROPION HCL ER (SR) 150 MG PO TB12: 150.0000 mg | ORAL_TABLET | Freq: Every day | ORAL | 0 refills | Status: DC

## 2017-06-22 MED ORDER — VITAMIN D (ERGOCALCIFEROL) 1.25 MG (50000 UNIT) PO CAPS: 50000.0000 [IU] | ORAL_CAPSULE | ORAL | 0 refills | Status: DC

## 2017-06-22 NOTE — Progress Notes (Signed)
Office: [phone]  /  Fax: [phone]   HPI:   Chief Complaint: OBESITY [name] is here to discuss her progress with her obesity treatment plan. She is on the Category 3 plan and is following her eating plan approximately 50 % of the time. She states she is doing some yard work for exercise. [name] has not been following the meal plan, as she had an increase in celebration eating. She also would like to try a different meal plan. Her weight is 225 lb (102.1 kg) today and has had a weight gain of 2 pounds over a period of 3 weeks since her last visit. She has lost 5 lbs since starting treatment with [name].  Vitamin D deficiency [name] has a diagnosis of vitamin D deficiency. She is currently taking vit D and denies nausea, vomiting or muscle weakness.  At risk for osteopenia and osteoporosis [name] is at higher risk of osteopenia and osteoporosis due to vitamin D deficiency.   Depression with emotional eating behaviors [name] is struggling with emotional eating and using food for comfort to the extent that it is negatively impacting her health. She often snacks when she is not hungry. [name] sometimes feels she is out of control and then feels guilty that she made poor food choices. She has been working on behavior modification techniques to help reduce her emotional eating and has been somewhat successful. Her mood is stable and she shows no sign of suicidal or homicidal ideations.  Depression screen PHQ 2/9 11/05/2016  Decreased Interest 0  Down, Depressed, Hopeless 1  PHQ - 2 Score 1  Altered sleeping 0  Tired, decreased energy 2  Change in appetite 2  Feeling bad or failure about yourself  0  Trouble concentrating 0  Moving slowly or fidgety/restless 0  Suicidal thoughts 0  PHQ-9 Score 5  Difficult doing work/chores Not difficult at all      ALLERGIES: Allergies  Allergen Reactions  . Lisinopril Swelling and Other (See Comments)    Cough     MEDICATIONS: Current  Outpatient Medications on File Prior to Visit  Medication Sig Dispense Refill  . amLODipine (NORVASC) 10 MG tablet Take 1 tablet (10 mg total) by mouth daily. 90 tablet 1  . buPROPion (WELLBUTRIN SR) 150 MG 12 hr tablet Take 1 tablet (150 mg total) by mouth daily. 30 tablet 0  . escitalopram (LEXAPRO) 10 MG tablet Take 1 tablet (10 mg total) by mouth daily. 30 tablet 2  . furosemide (LASIX) 20 MG tablet Take 1 tablet (20 mg total) by mouth daily. 90 tablet 1  . levocetirizine (XYZAL) 5 MG tablet Take 1 tablet (5 mg total) by mouth every evening. 90 tablet 1  . LORazepam (ATIVAN) 1 MG tablet Take 1 tablet (1 mg total) by mouth at bedtime. 30 tablet 0  . metFORMIN (GLUCOPHAGE) 500 MG tablet Take 1 tablet (500 mg total) by mouth daily with breakfast. 30 tablet 0  . metoprolol (TOPROL-XL) 200 MG 24 hr tablet Take 1 tablet (200 mg total) by mouth daily. 90 tablet 1  . potassium chloride (K-DUR) 10 MEQ tablet Take 1 tablet (10 mEq total) by mouth daily. 90 tablet 1  . Vitamin D, Ergocalciferol, (DRISDOL) 50000 units CAPS capsule Take 1 capsule (50,000 Units total) by mouth every 7 (seven) days. 4 capsule 0   No current facility-administered medications on file prior to visit.     PAST MEDICAL HISTORY: Past Medical History:  Diagnosis Date  . Anemia   .  Anxiety   . Arthritis   . Depression   . Fatigue   . Fibroids    uterine  . Hypertension   . Insulin resistance   . Joint pain   . Menorrhagia   . Pre-diabetes   . Umbilical hernia   . Vitamin D deficiency     PAST SURGICAL HISTORY: Past Surgical History:  Procedure Laterality Date  . CESAREAN SECTION    . CRYOTHERAPY  1989  . HYSTEROSCOPY W/D&C  06-20-2008  . [place] ABLATION  06-20-2008  . ROBOTIC ASSISTED TOTAL HYSTERECTOMY N/A 08/30/2014   Procedure: ROBOTIC ASSISTED ATTEMPTED, THEN CONVERSION TO OPEN TOTAL ABDOMINAL HYSTERECTOMY WITH BILATERAL SALPINGECTOMY;  Surgeon: Servando Salina, MD;  Location: [facility];  Service:  Gynecology;  Laterality: N/A;  . TMJ ARTHROPLASTY  1998   has screws in both side of jaw per pt  . tubal ligation  06-20-2008  . UMBILICAL HERNIA REPAIR N/A 08/30/2014   Procedure: HERNIA REPAIR UMBILICAL ADULT;  Surgeon: Autumn Messing III, MD;  Location: [facility];  Service: General;  Laterality: N/A;    SOCIAL HISTORY: Social History   Tobacco Use  . Smoking status: Never Smoker  . Smokeless tobacco: Never Used  Substance Use Topics  . Alcohol use: No  . Drug use: No    FAMILY HISTORY: Family History  Problem Relation Age of Onset  . Breast cancer Mother   . Hypertension Mother   . Diabetes Mother   . Cancer Mother        breast  . Hyperlipidemia Mother   . Obesity Mother   . Hypertension Father   . Hyperlipidemia Father   . Anxiety disorder Father   . Breast cancer Maternal Aunt   . Cancer Maternal Aunt        beast    ROS: Review of Systems  Constitutional: Negative for weight loss.  Gastrointestinal: Negative for nausea and vomiting.  Musculoskeletal:       Negative for muscle weakness  Psychiatric/Behavioral: Positive for depression. Negative for suicidal ideas.    PHYSICAL EXAM: Blood pressure 119/76, pulse 76, temperature 98.1 F (36.7 C), temperature source Oral, height 5\' 6"  (1.676 m), weight 225 lb (102.1 kg), last menstrual period 08/17/2014, SpO2 99 %. Body mass index is 36.32 kg/m. Physical Exam  Constitutional: She is oriented to person, place, and time. She appears well-developed and well-nourished.  Cardiovascular: Normal rate.  Pulmonary/Chest: Effort normal.  Musculoskeletal: Normal range of motion.  Neurological: She is oriented to person, place, and time.  Skin: Skin is warm and dry.  Psychiatric: She has a normal mood and affect. Her behavior is normal.  Vitals reviewed.   RECENT LABS AND TESTS: BMET    Component Value Date/Time   NA 140 11/05/2016 0948   K 4.5 11/05/2016 0948   CL 102 11/05/2016 0948   CO2 24 11/05/2016 0948    GLUCOSE 87 11/05/2016 0948   GLUCOSE 86 10/05/2016 1703   BUN 7 11/05/2016 0948   CREATININE 0.58 11/05/2016 0948   CREATININE 0.81 05/01/2013 1105   CALCIUM 8.7 11/05/2016 0948   GFRNONAA 112 11/05/2016 0948   GFRAA 129 11/05/2016 0948   Lab Results  Component Value Date   HGBA1C 5.8 (H) 11/05/2016   Lab Results  Component Value Date   INSULIN 12.9 11/05/2016   CBC    Component Value Date/Time   WBC 8.1 11/05/2016 0948   WBC 8.9 10/05/2016 1703   RBC 4.31 11/05/2016 0948   RBC 4.36 10/05/2016 1703  HGB 13.4 11/05/2016 0948   HCT 40.1 11/05/2016 0948   PLT 449.0 (H) 10/05/2016 1703   MCV 93 11/05/2016 0948   MCH 31.1 11/05/2016 0948   MCH 27.2 08/31/2014 0440   MCHC 33.4 11/05/2016 0948   MCHC 33.0 10/05/2016 1703   RDW 14.5 11/05/2016 0948   LYMPHSABS 3.0 11/05/2016 0948   MONOABS 0.5 10/05/2016 1703   EOSABS 0.1 11/05/2016 0948   BASOSABS 0.0 11/05/2016 0948   Iron/TIBC/Ferritin/ %Sat No results found for: IRON, TIBC, FERRITIN, IRONPCTSAT Lipid Panel     Component Value Date/Time   CHOL 168 11/05/2016 0948   TRIG 71 11/05/2016 0948   HDL 53 11/05/2016 0948   CHOLHDL 4 10/05/2016 1703   VLDL 21.8 10/05/2016 1703   LDLCALC 101 (H) 11/05/2016 0948   Hepatic Function Panel     Component Value Date/Time   PROT 6.5 11/05/2016 0948   ALBUMIN 4.0 11/05/2016 0948   AST 11 11/05/2016 0948   ALT 11 11/05/2016 0948   ALKPHOS 51 11/05/2016 0948   BILITOT 0.3 11/05/2016 0948      Component Value Date/Time   TSH 1.570 11/05/2016 0948   TSH 1.63 05/01/2016 1358   TSH 2.047 08/12/2012 0836   Results for KRUPA, STEGE (MRN [id number]) as of 06/22/2017 16:43  Ref. Range 11/05/2016 09:48  Vitamin D, 25-Hydroxy Latest Ref Range: 30.0 - 100.0 ng/mL 14.7 (L)   ASSESSMENT AND PLAN: Vitamin D deficiency - Plan: Vitamin D, Ergocalciferol, (DRISDOL) 50000 units CAPS capsule  Other depression - with emotional eating  At risk for osteoporosis  Class 2 severe obesity  with serious comorbidity and body mass index (BMI) of 36.0 to 36.9 in adult, unspecified obesity type ([place])  PLAN:  Vitamin D Deficiency Kamylah was informed that low vitamin D levels contributes to fatigue and are associated with obesity, breast, and colon cancer. She agrees to continue to take prescription Vit D @50 ,000 IU every week #4 with no refills and will follow up for routine testing of vitamin D, at least 2-3 times per year. She was informed of the risk of over-replacement of vitamin D and agrees to not increase her dose unless she discusses this with [name] first. Inessa agrees to follow up as directed.  At risk for osteopenia and osteoporosis Fama is at risk for osteopenia and osteoporosis due to her vitamin D deficiency. She was encouraged to take her vitamin D and follow her higher calcium diet and increase strengthening exercise to help strengthen her bones and decrease her risk of osteopenia and osteoporosis.  Depression with Emotional Eating Behaviors We discussed behavior modification techniques today to help Linzee deal with her emotional eating and depression. She has agreed to continue Wellbutrin SR 150 mg qd #30 with no refills and follow up as directed.  Obesity Jinger is currently in the action stage of change. As such, her goal is to continue with weight loss efforts She has agreed to follow a lower carbohydrate, vegetable and lean protein rich diet plan Trinia has been instructed to work up to a goal of 150 minutes of combined cardio and strengthening exercise per week for weight loss and overall health benefits. We discussed the following Behavioral Modification Strategies today: keeping healthy foods in the home, decreasing simple carbohydrates  and increasing vegetables  Beretta has agreed to follow up with our clinic in 2 weeks. She was informed of the importance of frequent follow up visits to maximize her success with intensive lifestyle modifications for her multiple health  conditions.    OBESITY BEHAVIORAL INTERVENTION VISIT  Today's visit was # 7 out of 22.  Starting weight: 230 lbs Starting date: 11/05/16 Today's weight : 225 lbs  Today's date: 06/22/2017 Total lbs lost to date: 5 (Patients must lose 7 lbs in the first 6 months to continue with counseling)   ASK: We discussed the diagnosis of obesity with Lyman Bishop today and Ai agreed to give [name] permission to discuss obesity behavioral modification therapy today.  ASSESS: Shakoya has the diagnosis of obesity and her BMI today is 36.33 Kamila is in the action stage of change   ADVISE: Annelies was educated on the multiple health risks of obesity as well as the benefit of weight loss to improve her health. She was advised of the need for long term treatment and the importance of lifestyle modifications.  AGREE: Multiple dietary modification options and treatment options were discussed and  Athanasia agreed to the above obesity treatment plan.   Corey Skains, am acting as transcriptionist for [employer], PA-C I, Lacy Duverney [facility], have reviewed this note and agree with its content

## 2017-07-08 ENCOUNTER — Ambulatory Visit: Payer: BC Managed Care – PPO | Admitting: Family Medicine

## 2017-07-12 ENCOUNTER — Ambulatory Visit (INDEPENDENT_AMBULATORY_CARE_PROVIDER_SITE_OTHER): Payer: BC Managed Care – PPO | Admitting: Physician Assistant

## 2017-07-12 ENCOUNTER — Encounter (INDEPENDENT_AMBULATORY_CARE_PROVIDER_SITE_OTHER): Payer: Self-pay

## 2017-07-15 ENCOUNTER — Ambulatory Visit: Payer: BC Managed Care – PPO | Admitting: Family Medicine

## 2017-07-15 ENCOUNTER — Encounter: Payer: Self-pay | Admitting: Family Medicine

## 2017-07-15 VITALS — BP 129/79 | HR 77 | Temp 99.1°F | Resp 16 | Ht 66.0 in | Wt 225.4 lb

## 2017-07-15 DIAGNOSIS — J302 Other seasonal allergic rhinitis: Secondary | ICD-10-CM

## 2017-07-15 DIAGNOSIS — E876 Hypokalemia: Secondary | ICD-10-CM | POA: Diagnosis not present

## 2017-07-15 DIAGNOSIS — F411 Generalized anxiety disorder: Secondary | ICD-10-CM | POA: Diagnosis not present

## 2017-07-15 DIAGNOSIS — E559 Vitamin D deficiency, unspecified: Secondary | ICD-10-CM

## 2017-07-15 DIAGNOSIS — M25562 Pain in left knee: Secondary | ICD-10-CM | POA: Diagnosis not present

## 2017-07-15 DIAGNOSIS — I10 Essential (primary) hypertension: Secondary | ICD-10-CM

## 2017-07-15 DIAGNOSIS — M62838 Other muscle spasm: Secondary | ICD-10-CM | POA: Diagnosis not present

## 2017-07-15 DIAGNOSIS — R7303 Prediabetes: Secondary | ICD-10-CM

## 2017-07-15 DIAGNOSIS — M25561 Pain in right knee: Secondary | ICD-10-CM

## 2017-07-15 DIAGNOSIS — G8929 Other chronic pain: Secondary | ICD-10-CM

## 2017-07-15 DIAGNOSIS — Z79899 Other long term (current) drug therapy: Secondary | ICD-10-CM

## 2017-07-15 LAB — COMPREHENSIVE METABOLIC PANEL
ALT: 11 U/L (ref 0–35)
AST: 10 U/L (ref 0–37)
Albumin: 4.3 g/dL (ref 3.5–5.2)
Alkaline Phosphatase: 49 U/L (ref 39–117)
BUN: 16 mg/dL (ref 6–23)
CO2: 30 mEq/L (ref 19–32)
Calcium: 9.2 mg/dL (ref 8.4–10.5)
Chloride: 104 mEq/L (ref 96–112)
Creatinine, Ser: 0.71 mg/dL (ref 0.40–1.20)
GFR: 113.79 mL/min (ref >60.00)
Glucose, Bld: 98 mg/dL (ref 70–99)
Potassium: 3.6 mEq/L (ref 3.5–5.1)
Sodium: 140 mEq/L (ref 135–145)
Total Bilirubin: 0.4 mg/dL (ref 0.2–1.2)
Total Protein: 7.3 g/dL (ref 6.0–8.3)

## 2017-07-15 LAB — LIPID PANEL
Cholesterol: 190 mg/dL (ref 0–200)
HDL: 43 mg/dL (ref >39.00)
LDL Cholesterol: 122 mg/dL — ABNORMAL HIGH (ref 0–99)
NonHDL: 146.94
Total CHOL/HDL Ratio: 4
Triglycerides: 125 mg/dL (ref 0.0–149.0)
VLDL: 25 mg/dL (ref 0.0–40.0)

## 2017-07-15 MED ORDER — VITAMIN D (ERGOCALCIFEROL) 1.25 MG (50000 UNIT) PO CAPS: 50000.0000 [IU] | ORAL_CAPSULE | ORAL | 0 refills | Status: DC

## 2017-07-15 MED ORDER — LORAZEPAM 1 MG PO TABS: 1.0000 mg | ORAL_TABLET | Freq: Every day | ORAL | 0 refills | Status: DC

## 2017-07-15 MED ORDER — METOPROLOL SUCCINATE ER 200 MG PO TB24: 200.0000 mg | ORAL_TABLET | Freq: Every day | ORAL | 1 refills | Status: DC

## 2017-07-15 MED ORDER — TRAMADOL HCL 50 MG PO TABS
50.0000 mg | ORAL_TABLET | Freq: Four times a day (QID) | ORAL | 0 refills | Status: DC | PRN
Start: 2017-07-15 — End: 2018-01-28

## 2017-07-15 MED ORDER — BUPROPION HCL ER (SR) 150 MG PO TB12: 150.0000 mg | ORAL_TABLET | Freq: Every day | ORAL | 0 refills | Status: DC

## 2017-07-15 MED ORDER — AMLODIPINE BESYLATE 10 MG PO TABS: 10.0000 mg | ORAL_TABLET | Freq: Every day | ORAL | 1 refills | Status: DC

## 2017-07-15 MED ORDER — POTASSIUM CHLORIDE ER 10 MEQ PO TBCR: 10.0000 meq | EXTENDED_RELEASE_TABLET | Freq: Every day | ORAL | 1 refills | Status: DC

## 2017-07-15 MED ORDER — FUROSEMIDE 20 MG PO TABS: 20.0000 mg | ORAL_TABLET | Freq: Every day | ORAL | 1 refills | Status: DC

## 2017-07-15 MED ORDER — CYCLOBENZAPRINE HCL 10 MG PO TABS
10.0000 mg | ORAL_TABLET | Freq: Three times a day (TID) | ORAL | 0 refills | Status: DC | PRN
Start: 2017-07-15 — End: 2018-01-28

## 2017-07-15 MED ORDER — LEVOCETIRIZINE DIHYDROCHLORIDE 5 MG PO TABS: 5.0000 mg | ORAL_TABLET | Freq: Every evening | ORAL | 1 refills | Status: DC

## 2017-07-15 MED ORDER — METFORMIN HCL 500 MG PO TABS: 500.0000 mg | ORAL_TABLET | Freq: Every day | ORAL | 0 refills | Status: DC

## 2017-07-15 MED FILL — FUROSEMIDE 20 MG TAB: 20 | 90 days supply | Qty: 90 | Fill #0

## 2017-07-15 MED FILL — POTASSIUM CL 10 MEQ TAB SA: 10 | 90 days supply | Qty: 90 | Fill #0

## 2017-07-15 MED FILL — LORazepam 1 MG TABS: 1 | 30 days supply | Qty: 30 | Fill #0

## 2017-07-15 MED FILL — VIT D2 1.25 MG (50,000 UNIT: 1.25 MG | 28 days supply | Qty: 4 | Fill #0

## 2017-07-15 MED FILL — metFORMIN HCL 500 MG TABS: 500 | 30 days supply | Qty: 30 | Fill #0

## 2017-07-15 MED FILL — CYCLOBENZAPRINE HCL 10 MG T: 10 | 10 days supply | Qty: 30 | Fill #0

## 2017-07-15 MED FILL — AMLODIPINE BESYLATE 10 MG T: 10 | 90 days supply | Qty: 90 | Fill #0

## 2017-07-15 MED FILL — BUPROPION SR 150 MG TABLET: 150 | 30 days supply | Qty: 30 | Fill #0

## 2017-07-15 MED FILL — traMADol HCL 50 MG TABS: 50 | 7 days supply | Qty: 30 | Fill #0

## 2017-07-15 NOTE — Patient Instructions (Signed)
DASH Eating Plan DASH stands for "Dietary Approaches to Stop Hypertension." The DASH eating plan is a healthy eating plan that has been shown to reduce high blood pressure (hypertension). It may also reduce your risk for type 2 diabetes, heart disease, and stroke. The DASH eating plan may also help with weight loss. What are tips for following this plan? General guidelines  Avoid eating more than 2,300 mg (milligrams) of salt (sodium) a day. If you have hypertension, you may need to reduce your sodium intake to 1,500 mg a day.  Limit alcohol intake to no more than 1 drink a day for nonpregnant women and 2 drinks a day for men. One drink equals 12 oz of beer, 5 oz of wine, or 1 oz of hard liquor.  Work with your health care provider to maintain a healthy body weight or to lose weight. Ask what an ideal weight is for you.  Get at least 30 minutes of exercise that causes your heart to beat faster (aerobic exercise) most days of the week. Activities may include walking, swimming, or biking.  Work with your health care provider or diet and nutrition specialist (dietitian) to adjust your eating plan to your individual calorie needs. Reading food labels  Check food labels for the amount of sodium per serving. Choose foods with less than 5 percent of the Daily Value of sodium. Generally, foods with less than 300 mg of sodium per serving fit into this eating plan.  To find whole grains, look for the word "whole" as the first word in the ingredient list. Shopping  Buy products labeled as "low-sodium" or "no salt added."  Buy fresh foods. Avoid canned foods and premade or frozen meals. Cooking  Avoid adding salt when cooking. Use salt-free seasonings or herbs instead of table salt or sea salt. Check with your health care provider or pharmacist before using salt substitutes.  Do not fry foods. Cook foods using healthy methods such as baking, boiling, grilling, and broiling instead.  Cook with  heart-healthy oils, such as olive, canola, soybean, or sunflower oil. Meal planning   Eat a balanced diet that includes: ? 5 or more servings of fruits and vegetables each day. At each meal, try to fill half of your plate with fruits and vegetables. ? Up to 6-8 servings of whole grains each day. ? Less than 6 oz of lean meat, poultry, or fish each day. A 3-oz serving of meat is about the same size as a deck of cards. One egg equals 1 oz. ? 2 servings of low-fat dairy each day. ? A serving of nuts, seeds, or beans 5 times each week. ? Heart-healthy fats. Healthy fats called Omega-3 fatty acids are found in foods such as flaxseeds and coldwater fish, like sardines, salmon, and mackerel.  Limit how much you eat of the following: ? Canned or prepackaged foods. ? Food that is high in trans fat, such as fried foods. ? Food that is high in saturated fat, such as fatty meat. ? Sweets, desserts, sugary drinks, and other foods with added sugar. ? Full-fat dairy products.  Do not salt foods before eating.  Try to eat at least 2 vegetarian meals each week.  Eat more home-cooked food and less restaurant, buffet, and fast food.  When eating at a restaurant, ask that your food be prepared with less salt or no salt, if possible. What foods are recommended? The items listed may not be a complete list. Talk with your dietitian about what   dietary choices are best for you. Grains Whole-grain or whole-wheat bread. Whole-grain or whole-wheat pasta. Brown rice. Oatmeal. Quinoa. Bulgur. Whole-grain and low-sodium cereals. Pita bread. Low-fat, low-sodium crackers. Whole-wheat flour tortillas. Vegetables Fresh or frozen vegetables (raw, steamed, roasted, or grilled). Low-sodium or reduced-sodium tomato and vegetable juice. Low-sodium or reduced-sodium tomato sauce and tomato paste. Low-sodium or reduced-sodium canned vegetables. Fruits All fresh, dried, or frozen fruit. Canned fruit in natural juice (without  added sugar). Meat and other protein foods Skinless chicken or turkey. Ground chicken or turkey. Pork with fat trimmed off. Fish and seafood. Egg whites. Dried beans, peas, or lentils. Unsalted nuts, nut butters, and seeds. Unsalted canned beans. Lean cuts of beef with fat trimmed off. Low-sodium, lean deli meat. Dairy Low-fat (1%) or fat-free (skim) milk. Fat-free, low-fat, or reduced-fat cheeses. Nonfat, low-sodium ricotta or cottage cheese. Low-fat or nonfat yogurt. Low-fat, low-sodium cheese. Fats and oils Soft margarine without trans fats. Vegetable oil. Low-fat, reduced-fat, or light mayonnaise and salad dressings (reduced-sodium). Canola, safflower, olive, soybean, and sunflower oils. Avocado. Seasoning and other foods Herbs. Spices. Seasoning mixes without salt. Unsalted popcorn and pretzels. Fat-free sweets. What foods are not recommended? The items listed may not be a complete list. Talk with your dietitian about what dietary choices are best for you. Grains Baked goods made with fat, such as croissants, muffins, or some breads. Dry pasta or rice meal packs. Vegetables Creamed or fried vegetables. Vegetables in a cheese sauce. Regular canned vegetables (not low-sodium or reduced-sodium). Regular canned tomato sauce and paste (not low-sodium or reduced-sodium). Regular tomato and vegetable juice (not low-sodium or reduced-sodium). Pickles. Olives. Fruits Canned fruit in a light or heavy syrup. Fried fruit. Fruit in cream or butter sauce. Meat and other protein foods Fatty cuts of meat. Ribs. Fried meat. Bacon. Sausage. Bologna and other processed lunch meats. Salami. Fatback. Hotdogs. Bratwurst. Salted nuts and seeds. Canned beans with added salt. Canned or smoked fish. Whole eggs or egg yolks. Chicken or turkey with skin. Dairy Whole or 2% milk, cream, and half-and-half. Whole or full-fat cream cheese. Whole-fat or sweetened yogurt. Full-fat cheese. Nondairy creamers. Whipped toppings.  Processed cheese and cheese spreads. Fats and oils Butter. Stick margarine. Lard. Shortening. Ghee. Bacon fat. Tropical oils, such as coconut, palm kernel, or palm oil. Seasoning and other foods Salted popcorn and pretzels. Onion salt, garlic salt, seasoned salt, table salt, and sea salt. Worcestershire sauce. Tartar sauce. Barbecue sauce. Teriyaki sauce. Soy sauce, including reduced-sodium. Steak sauce. Canned and packaged gravies. Fish sauce. Oyster sauce. Cocktail sauce. Horseradish that you find on the shelf. Ketchup. Mustard. Meat flavorings and tenderizers. Bouillon cubes. Hot sauce and Tabasco sauce. Premade or packaged marinades. Premade or packaged taco seasonings. Relishes. Regular salad dressings. Where to find more information:  National Heart, Lung, and Blood Institute: www.nhlbi.nih.gov  American Heart Association: www.heart.org Summary  The DASH eating plan is a healthy eating plan that has been shown to reduce high blood pressure (hypertension). It may also reduce your risk for type 2 diabetes, heart disease, and stroke.  With the DASH eating plan, you should limit salt (sodium) intake to 2,300 mg a day. If you have hypertension, you may need to reduce your sodium intake to 1,500 mg a day.  When on the DASH eating plan, aim to eat more fresh fruits and vegetables, whole grains, lean proteins, low-fat dairy, and heart-healthy fats.  Work with your health care provider or diet and nutrition specialist (dietitian) to adjust your eating plan to your individual   calorie needs. This information is not intended to replace advice given to you by your health care provider. Make sure you discuss any questions you have with your health care provider. Document Released: 01/01/2011 Document Revised: 01/06/2016 Document Reviewed: 01/06/2016 Elsevier Interactive Patient Education  2018 Elsevier Inc.  

## 2017-07-15 NOTE — Assessment & Plan Note (Signed)
Well controlled, no changes to meds. Encouraged heart healthy diet such as the DASH diet and exercise as tolerated.  °

## 2017-07-15 NOTE — Progress Notes (Signed)
Patient ID: [name], female    DOB: [birth year]  Age: 47 y.o. MRN: [id number]    Subjective:  Subjective  HPI CARNISHA FELTZ presents for bp f/u   Review of Systems  Constitutional: Negative for fever.  HENT: Negative for congestion.   Respiratory: Negative for shortness of breath.   Cardiovascular: Negative for chest pain, palpitations and leg swelling.  Gastrointestinal: Negative for abdominal pain, blood in stool and nausea.  Genitourinary: Negative for dysuria and frequency.  Skin: Negative for rash.  Allergic/Immunologic: Negative for environmental allergies.  Neurological: Negative for dizziness and headaches.  Psychiatric/Behavioral: The patient is not nervous/anxious.     History Past Medical History:  Diagnosis Date  . Anemia   . Anxiety   . Arthritis   . Depression   . Fatigue   . Fibroids    uterine  . Hypertension   . Insulin resistance   . Joint pain   . Menorrhagia   . Pre-diabetes   . Umbilical hernia   . Vitamin D deficiency     She has a past surgical history that includes [place] ablation (06-20-2008); Hysteroscopy w/D&C (06-20-2008); tubal ligation (06-20-2008); Cryotherapy (1989); Cesarean section; TMJ Arthroplasty (1998); Robotic assisted total hysterectomy (N/A, 03/01/5359); and Umbilical hernia repair (N/A, 08/30/2014).   Her family history includes Anxiety disorder in her father; Breast cancer in her maternal aunt and mother; Cancer in her maternal aunt and mother; Diabetes in her mother; Hyperlipidemia in her father and mother; Hypertension in her father and mother; Obesity in her mother.She reports that she has never smoked. She has never used smokeless tobacco. She reports that she does not drink alcohol or use drugs.  Current Outpatient Medications on File Prior to Visit  Medication Sig Dispense Refill  . escitalopram (LEXAPRO) 10 MG tablet Take 1 tablet (10 mg total) by mouth daily. 30 tablet 2   No current facility-administered medications on  file prior to visit.      Objective:  Objective  Physical Exam  Constitutional: She is oriented to person, place, and time. She appears well-developed and well-nourished.  HENT:  Head: Normocephalic and atraumatic.  Eyes: Conjunctivae and EOM are normal.  Neck: Normal range of motion. Neck supple. No JVD present. Carotid bruit is not present. No thyromegaly present.  Cardiovascular: Normal rate, regular rhythm and normal heart sounds.  No murmur heard. Pulmonary/Chest: Effort normal and breath sounds normal. No respiratory distress. She has no wheezes. She has no rales. She exhibits no tenderness.  Musculoskeletal: She exhibits no edema.  Neurological: She is alert and oriented to person, place, and time.  Psychiatric: She has a normal mood and affect.  Nursing note and vitals reviewed.  BP 129/79   Pulse 77   Temp 99.1 F (37.3 C) (Oral)   Resp 16   Ht 5\' 6"  (1.676 m)   Wt 225 lb 6.4 oz (102.2 kg)   LMP 08/17/2014 (Exact Date)   SpO2 100%   BMI 36.38 kg/m  Wt Readings from Last 3 Encounters:  07/15/17 225 lb 6.4 oz (102.2 kg)  06/22/17 225 lb (102.1 kg)  06/01/17 223 lb (101.2 kg)     Lab Results  Component Value Date   WBC 8.1 11/05/2016   HGB 13.4 11/05/2016   HCT 40.1 11/05/2016   PLT 449.0 (H) 10/05/2016   GLUCOSE 98 07/15/2017   CHOL 190 07/15/2017   TRIG 125.0 07/15/2017   HDL 43.00 07/15/2017   LDLCALC 122 (H) 07/15/2017   ALT 11 07/15/2017  AST 10 07/15/2017   NA 140 07/15/2017   K 3.6 07/15/2017   CL 104 07/15/2017   CREATININE 0.71 07/15/2017   BUN 16 07/15/2017   CO2 30 07/15/2017   TSH 1.570 11/05/2016   HGBA1C 5.8 (H) 11/05/2016   MICROALBUR 0.6 05/01/2016    No results found.   Assessment & Plan:  Plan  I am having Kristen Cardinal. [name] start on cyclobenzaprine and traMADol. I am also having her maintain her escitalopram, amLODipine, furosemide, levocetirizine, LORazepam, metoprolol, potassium chloride, buPROPion, metFORMIN, and Vitamin D  (Ergocalciferol).  Meds ordered this encounter  Medications  . cyclobenzaprine (FLEXERIL) 10 MG tablet    Sig: Take 1 tablet (10 mg total) by mouth 3 (three) times daily as needed for muscle spasms.    Dispense:  30 tablet    Refill:  0  . traMADol (ULTRAM) 50 MG tablet    Sig: Take 1 tablet (50 mg total) by mouth every 6 (six) hours as needed.    Dispense:  30 tablet    Refill:  0  . amLODipine (NORVASC) 10 MG tablet    Sig: Take 1 tablet (10 mg total) by mouth daily.    Dispense:  90 tablet    Refill:  1  . furosemide (LASIX) 20 MG tablet    Sig: Take 1 tablet (20 mg total) by mouth daily.    Dispense:  90 tablet    Refill:  1  . levocetirizine (XYZAL) 5 MG tablet    Sig: Take 1 tablet (5 mg total) by mouth every evening.    Dispense:  90 tablet    Refill:  1  . LORazepam (ATIVAN) 1 MG tablet    Sig: Take 1 tablet (1 mg total) by mouth at bedtime.    Dispense:  30 tablet    Refill:  0  . metoprolol (TOPROL-XL) 200 MG 24 hr tablet    Sig: Take 1 tablet (200 mg total) by mouth daily.    Dispense:  90 tablet    Refill:  1    D/c previous script for 150mg   . potassium chloride (K-DUR) 10 MEQ tablet    Sig: Take 1 tablet (10 mEq total) by mouth daily.    Dispense:  90 tablet    Refill:  1  . buPROPion (WELLBUTRIN SR) 150 MG 12 hr tablet    Sig: Take 1 tablet (150 mg total) by mouth daily.    Dispense:  30 tablet    Refill:  0  . metFORMIN (GLUCOPHAGE) 500 MG tablet    Sig: Take 1 tablet (500 mg total) by mouth daily with breakfast.    Dispense:  30 tablet    Refill:  0  . Vitamin D, Ergocalciferol, (DRISDOL) 50000 units CAPS capsule    Sig: Take 1 capsule (50,000 Units total) by mouth every 7 (seven) days.    Dispense:  4 capsule    Refill:  0    Problem List Items Addressed This Visit      Unprioritized   Essential hypertension    Well controlled, no changes to meds. Encouraged heart healthy diet such as the DASH diet and exercise as tolerated.       Relevant  Medications   amLODipine (NORVASC) 10 MG tablet   furosemide (LASIX) 20 MG tablet   metoprolol (TOPROL-XL) 200 MG 24 hr tablet   Other Relevant Orders   Comprehensive metabolic panel (Completed)   Lipid panel (Completed)   Generalized anxiety disorder   Relevant  Medications   LORazepam (ATIVAN) 1 MG tablet   buPROPion (WELLBUTRIN SR) 150 MG 12 hr tablet   Hypokalemia   Relevant Medications   potassium chloride (K-DUR) 10 MEQ tablet   Prediabetes    Start meds per healthy weight and wellness       Relevant Medications   metFORMIN (GLUCOPHAGE) 500 MG tablet   Vitamin D deficiency   Relevant Medications   Vitamin D, Ergocalciferol, (DRISDOL) 50000 units CAPS capsule    Other Visit Diagnoses    High risk medication use    -  Primary   Relevant Orders   Pain Mgmt, Profile 8 w/Conf, U   Muscle spasm       Relevant Medications   cyclobenzaprine (FLEXERIL) 10 MG tablet   Chronic pain of both knees       Relevant Medications   cyclobenzaprine (FLEXERIL) 10 MG tablet   traMADol (ULTRAM) 50 MG tablet   buPROPion (WELLBUTRIN SR) 150 MG 12 hr tablet   Other Relevant Orders   Ambulatory referral to Sports Medicine   Seasonal allergies       Relevant Medications   levocetirizine (XYZAL) 5 MG tablet   Morbid obesity (HCC)       Relevant Medications   buPROPion (WELLBUTRIN SR) 150 MG 12 hr tablet   metFORMIN (GLUCOPHAGE) 500 MG tablet      Follow-up: Return in about 6 months (around 01/14/2018), or if symptoms worsen or fail to improve, for hypertension, hyperlipidemia, diabetes II.  Ann Held, DO

## 2017-07-15 NOTE — Assessment & Plan Note (Signed)
Start meds per healthy weight and wellness

## 2017-07-17 LAB — PAIN MGMT, PROFILE 8 W/CONF, U
6 Acetylmorphine: NEGATIVE ng/mL (ref <10)
Alcohol Metabolites: NEGATIVE ng/mL (ref <500)
Alphahydroxyalprazolam: NEGATIVE ng/mL (ref <25)
Alphahydroxymidazolam: NEGATIVE ng/mL (ref <50)
Alphahydroxytriazolam: NEGATIVE ng/mL (ref <50)
Aminoclonazepam: NEGATIVE ng/mL (ref <25)
Amphetamines: NEGATIVE ng/mL (ref <500)
Benzodiazepines: POSITIVE ng/mL — AB (ref <100)
Buprenorphine, Urine: NEGATIVE ng/mL (ref <5)
Cocaine Metabolite: NEGATIVE ng/mL (ref <150)
Creatinine: 83.8 mg/dL
Hydroxyethylflurazepam: NEGATIVE ng/mL (ref <50)
Lorazepam: 259 ng/mL — ABNORMAL HIGH (ref <50)
MDMA: NEGATIVE ng/mL (ref <500)
Marijuana Metabolite: NEGATIVE ng/mL (ref <20)
Nordiazepam: NEGATIVE ng/mL (ref <50)
Opiates: NEGATIVE ng/mL (ref <100)
Oxazepam: NEGATIVE ng/mL (ref <50)
Oxidant: NEGATIVE ug/mL (ref <200)
Oxycodone: NEGATIVE ng/mL (ref <100)
Temazepam: NEGATIVE ng/mL (ref <50)
pH: 6.94 (ref 4.5–9.0)

## 2017-07-26 NOTE — Progress Notes (Signed)
[name] [address], [place] [address] Phone: [phone] Subjective:     CC: Knee pain  JGG:[id number]  [name] is a 47 y.o. female coming in with complaint of bilateral knee pain. Saw Percell Miller and [place] 3 years ago and was told that her knees are bone on bone. 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   She also is having pain on the left scapula that is intermittent. Pain started 1 month ago. Denies any radiating symptoms.       Past Medical History:  Diagnosis Date  . Anemia   . Anxiety   . Arthritis   . Depression   . Fatigue   . Fibroids    uterine  . Hypertension   . Insulin resistance   . Joint pain   . Menorrhagia   . Pre-diabetes   . Umbilical hernia   . Vitamin D deficiency    Past Surgical History:  Procedure Laterality Date  . CESAREAN SECTION    . CRYOTHERAPY  1989  . HYSTEROSCOPY W/D&C  06-20-2008  . [place] ABLATION  06-20-2008  . ROBOTIC ASSISTED TOTAL HYSTERECTOMY N/A 08/30/2014   Procedure: ROBOTIC ASSISTED ATTEMPTED, THEN CONVERSION TO OPEN TOTAL ABDOMINAL HYSTERECTOMY WITH BILATERAL SALPINGECTOMY;  Surgeon: Servando Salina, MD;  Location: [facility];  Service: Gynecology;  Laterality: N/A;  . TMJ ARTHROPLASTY  1998   has screws in both side of jaw per pt  . tubal ligation  06-20-2008  . UMBILICAL HERNIA REPAIR N/A 08/30/2014   Procedure: HERNIA REPAIR UMBILICAL ADULT;  Surgeon: Autumn Messing III, MD;  Location: [facility];  Service: General;  Laterality: N/A;   Social History   Socioeconomic History  . Marital status: Divorced    Spouse name: Not on file  . Number of children: 2  . Years of education: Not on file  . Highest education level: Not on file  Occupational History  . Occupation: Product manager: [employer]  . Financial resource strain: Not on file  . Food insecurity:    Worry: Not on file    Inability: Not on file   . Transportation needs:    Medical: Not on file    Non-medical: Not on file  Tobacco Use  . Smoking status: Never Smoker  . Smokeless tobacco: Never Used  Substance and Sexual Activity  . Alcohol use: No  . Drug use: No  . Sexual activity: Yes    Birth control/protection: Surgical  Lifestyle  . Physical activity:    Days per week: Not on file    Minutes per session: Not on file  . Stress: Not on file  Relationships  . Social connections:    Talks on phone: Not on file    Gets together: Not on file    Attends religious service: Not on file    Active member of club or organization: Not on file    Attends meetings of clubs or organizations: Not on file    Relationship status: Not on file  Other Topics Concern  . Not on file  Social History Narrative  . Not on file   Allergies  Allergen Reactions  . Lisinopril Swelling and Other (See Comments)    Cough    Family History  Problem Relation Age of Onset  . Breast cancer Mother   . Hypertension Mother   . Diabetes Mother   .  Cancer Mother        breast  . Hyperlipidemia Mother   . Obesity Mother   . Hypertension Father   . Hyperlipidemia Father   . Anxiety disorder Father   . Breast cancer Maternal Aunt   . Cancer Maternal Aunt        beast     Past medical history, social, surgical and family history all reviewed in electronic medical record.  No pertanent information unless stated regarding to the chief complaint.   Review of Systems:Review of systems updated and as accurate as of 07/27/17  No headache, visual changes, nausea, vomiting, diarrhea, constipation, dizziness, abdominal pain, skin rash, fevers, chills, night sweats, weight loss, swollen lymph nodes, body aches, joint swelling, muscle aches, chest pain, shortness of breath, mood changes.   Objective  Blood pressure (!) 142/102, pulse 90, height 5\' 6"  (1.676 m), weight 227 lb (103 kg), last menstrual period 08/17/2014, SpO2 98 %. Systems examined below  as of 07/27/17   General: No apparent distress alert and oriented x3 mood and affect normal, dressed appropriately.  HEENT: Pupils equal, extraocular movements intact  Respiratory: Patient's speak in full sentences and does not appear short of breath  Cardiovascular: No lower extremity edema, non tender, no erythema  Skin: Warm dry intact with no signs of infection or rash on extremities or on axial skeleton.  Abdomen: Soft nontender  Neuro: Cranial nerves II through XII are intact, neurovascularly intact in all extremities with 2+ DTRs and 2+ pulses.  Lymph: No lymphadenopathy of posterior or anterior cervical chain or axillae bilaterally.  Gait mild antalgic MSK:  Non tender with full range of motion and good stability and symmetric strength and tone of shoulders, elbows, wrist, hip, and ankles bilaterally.   Knee: Bilateral valgus deformity noted.  Abnormal thigh to calf ratio.  Tender to palpation over medial and PF joint line.  ROM full in flexion and extension and lower leg rotation. instability with valgus force.  painful patellar compression. Patellar glide with moderate crepitus. Patellar and quadriceps tendons unremarkable. Hamstring and quadriceps strength is normal.  [id number]; 15 additional minutes spent for Therapeutic exercises as stated in above notes.  This included exercises focusing on stretching, strengthening, with significant focus on eccentric aspects.   Long term goals include an improvement in range of motion, strength, endurance as well as avoiding reinjury. Patient's frequency would include in 1-2 times a day, 3-5 times a week for a duration of 6-12 weeks.  Given rehab exercises handout for VMO, hip abductors, core, entire kinetic chain including proprioception exercises including cone touches, step downs, hip elevations and turn outs.  Could benefit from PT, regular exercise, upright biking, and a PFS knee brace to assist with tracking abnormalities.  Proper  technique shown and discussed handout in great detail with ATC.  All questions were discussed and answered.     Impression and Recommendations:     This case required medical decision making of moderate complexity.      Note: This dictation was prepared with Dragon dictation along with smaller phrase technology. Any transcriptional errors that result from this process are unintentional.

## 2017-07-27 ENCOUNTER — Encounter: Payer: Self-pay | Admitting: Family Medicine

## 2017-07-27 ENCOUNTER — Other Ambulatory Visit: Payer: Self-pay

## 2017-07-27 ENCOUNTER — Ambulatory Visit: Payer: BC Managed Care – PPO | Admitting: Family Medicine

## 2017-07-27 DIAGNOSIS — M170 Bilateral primary osteoarthritis of knee: Secondary | ICD-10-CM | POA: Diagnosis not present

## 2017-07-27 MED ORDER — DICLOFENAC SODIUM 2 % TD SOLN: 2.0000 g | Freq: Two times a day (BID) | TRANSDERMAL | 3 refills | Status: DC

## 2017-07-27 NOTE — Patient Instructions (Signed)
Good to see you  Ice is your friend Ice 20 minutes 2 times daily. Usually after activity and before bed. Exercises 3 times a week.  Stay active overall  pennsaid pinkie amount topically 2 times daily as needed.  Over the counter get  Turmeric 500mg  daily  Tart cherry extract any dose at night See me again in 4-6 weeks

## 2017-07-27 NOTE — Assessment & Plan Note (Signed)
Knee arthritis.  Will work on vastus medialis oblique strengthening.  We discussed icing regimen and home exercises.  Discussed topical anti-inflammatories are prescribed.  Work with Product/process development scientist to learn home exercises in greater detail.  Patient will follow-up with me again in 4 weeks.  Worsening symptoms consider injection and Visco supplementation.

## 2017-08-09 ENCOUNTER — Telehealth: Payer: Self-pay | Admitting: *Deleted

## 2017-08-09 NOTE — Telephone Encounter (Signed)
Copied from [place] [id number]. Topic: General - Other >> Aug 09, 2017 12:34 PM Judyann Munson wrote: Reason for CRM: Patient is needing a TB shot for new employment. Please advise thank you

## 2017-08-10 NOTE — Telephone Encounter (Signed)
We saw her in June is it ok to do skin or blood test if needed?

## 2017-08-10 NOTE — Telephone Encounter (Signed)
Yes its ok

## 2017-08-13 ENCOUNTER — Encounter: Payer: Self-pay | Admitting: Family Medicine

## 2017-08-13 ENCOUNTER — Ambulatory Visit (INDEPENDENT_AMBULATORY_CARE_PROVIDER_SITE_OTHER): Payer: BC Managed Care – PPO | Admitting: Family Medicine

## 2017-08-13 VITALS — BP 136/96 | HR 72 | Temp 98.5°F | Resp 16 | Ht 65.5 in | Wt 226.0 lb

## 2017-08-13 DIAGNOSIS — Z0000 Encounter for general adult medical examination without abnormal findings: Secondary | ICD-10-CM

## 2017-08-13 DIAGNOSIS — E1151 Type 2 diabetes mellitus with diabetic peripheral angiopathy without gangrene: Secondary | ICD-10-CM | POA: Diagnosis not present

## 2017-08-13 LAB — COMPREHENSIVE METABOLIC PANEL
ALT: 12 U/L (ref 0–35)
AST: 12 U/L (ref 0–37)
Albumin: 4.2 g/dL (ref 3.5–5.2)
Alkaline Phosphatase: 55 U/L (ref 39–117)
BUN: 14 mg/dL (ref 6–23)
CO2: 31 mEq/L (ref 19–32)
Calcium: 9.2 mg/dL (ref 8.4–10.5)
Chloride: 104 mEq/L (ref 96–112)
Creatinine, Ser: 0.8 mg/dL (ref 0.40–1.20)
GFR: 99.12 mL/min (ref >60.00)
Glucose, Bld: 120 mg/dL — ABNORMAL HIGH (ref 70–99)
Potassium: 3.8 mEq/L (ref 3.5–5.1)
Sodium: 140 mEq/L (ref 135–145)
Total Bilirubin: 0.3 mg/dL (ref 0.2–1.2)
Total Protein: 7.3 g/dL (ref 6.0–8.3)

## 2017-08-13 LAB — CBC WITH DIFFERENTIAL/PLATELET
Basophils Absolute: 0.1 10*3/uL (ref 0.0–0.1)
Basophils Relative: 1.1 % (ref 0.0–3.0)
Eosinophils Absolute: 0.2 10*3/uL (ref 0.0–0.7)
Eosinophils Relative: 1.9 % (ref 0.0–5.0)
HCT: 40.9 % (ref 36.0–46.0)
Hemoglobin: 13.7 g/dL (ref 12.0–15.0)
Lymphocytes Relative: 42.2 % (ref 12.0–46.0)
Lymphs Abs: 3.4 10*3/uL (ref 0.7–4.0)
MCHC: 33.5 g/dL (ref 30.0–36.0)
MCV: 94.4 fl (ref 78.0–100.0)
Monocytes Absolute: 0.4 10*3/uL (ref 0.1–1.0)
Monocytes Relative: 4.4 % (ref 3.0–12.0)
Neutro Abs: 4 10*3/uL (ref 1.4–7.7)
Neutrophils Relative %: 50.4 % (ref 43.0–77.0)
Platelets: 456 10*3/uL — ABNORMAL HIGH (ref 150.0–400.0)
RBC: 4.33 Mil/uL (ref 3.87–5.11)
RDW: 13.9 % (ref 11.5–15.5)
WBC: 8 10*3/uL (ref 4.0–10.5)

## 2017-08-13 LAB — LIPID PANEL
Cholesterol: 175 mg/dL (ref 0–200)
HDL: 41.4 mg/dL (ref >39.00)
LDL Cholesterol: 97 mg/dL (ref 0–99)
NonHDL: 133.21
Total CHOL/HDL Ratio: 4
Triglycerides: 182 mg/dL — ABNORMAL HIGH (ref 0.0–149.0)
VLDL: 36.4 mg/dL (ref 0.0–40.0)

## 2017-08-13 LAB — HEMOGLOBIN A1C: Hgb A1c MFr Bld: 6.1 % (ref 4.6–6.5)

## 2017-08-13 LAB — TSH: TSH: 1.02 u[IU]/mL (ref 0.35–4.50)

## 2017-08-13 NOTE — Progress Notes (Signed)
Patient ID: [name], female    DOB: February 16, 1970  Age: 47 y.o. MRN: [id number]    Subjective:  Subjective  HPI [name] presents for cpe and form to be filled out for school.  No complaints  Review of Systems  Constitutional: Negative for chills and fever.  HENT: Negative for congestion and hearing loss.   Eyes: Negative for discharge.  Respiratory: Negative for cough and shortness of breath.   Cardiovascular: Negative for chest pain, palpitations and leg swelling.  Gastrointestinal: Negative for abdominal pain, blood in stool, constipation, diarrhea, nausea and vomiting.  Genitourinary: Negative for dysuria, frequency, hematuria and urgency.  Musculoskeletal: Negative for back pain and myalgias.  Skin: Negative for rash.  Allergic/Immunologic: Negative for environmental allergies.  Neurological: Negative for dizziness, weakness and headaches.  Hematological: Does not bruise/bleed easily.  Psychiatric/Behavioral: Negative for suicidal ideas. The patient is not nervous/anxious.     History Past Medical History:  Diagnosis Date  . Anemia   . Anxiety   . Arthritis   . Depression   . Fatigue   . Fibroids    uterine  . Hypertension   . Insulin resistance   . Joint pain   . Menorrhagia   . Pre-diabetes   . Umbilical hernia   . Vitamin D deficiency     She has a past surgical history that includes Novasure ablation (06-20-2008); Hysteroscopy w/D&C (06-20-2008); tubal ligation (06-20-2008); Cryotherapy (1989); Cesarean section; TMJ Arthroplasty (1998); Robotic assisted total hysterectomy (N/A, 01/28/8784); and Umbilical hernia repair (N/A, 08/30/2014).   Her family history includes Anxiety disorder in her father; Breast cancer in her maternal aunt and mother; Cancer in her maternal aunt and mother; Diabetes in her mother; Hyperlipidemia in her father and mother; Hypertension in her father and mother; Obesity in her mother.She reports that she has never smoked. She has never used  smokeless tobacco. She reports that she does not drink alcohol or use drugs.  Current Outpatient Medications on File Prior to Visit  Medication Sig Dispense Refill  . amLODipine (NORVASC) 10 MG tablet Take 1 tablet (10 mg total) by mouth daily. 90 tablet 1  . buPROPion (WELLBUTRIN SR) 150 MG 12 hr tablet Take 1 tablet (150 mg total) by mouth daily. 30 tablet 0  . cyclobenzaprine (FLEXERIL) 10 MG tablet Take 1 tablet (10 mg total) by mouth 3 (three) times daily as needed for muscle spasms. 30 tablet 0  . Diclofenac Sodium 2 % SOLN Place 2 g onto the skin 2 (two) times daily. 112 g 3  . escitalopram (LEXAPRO) 10 MG tablet Take 1 tablet (10 mg total) by mouth daily. 30 tablet 2  . furosemide (LASIX) 20 MG tablet Take 1 tablet (20 mg total) by mouth daily. 90 tablet 1  . levocetirizine (XYZAL) 5 MG tablet Take 1 tablet (5 mg total) by mouth every evening. 90 tablet 1  . LORazepam (ATIVAN) 1 MG tablet Take 1 tablet (1 mg total) by mouth at bedtime. 30 tablet 0  . metFORMIN (GLUCOPHAGE) 500 MG tablet Take 1 tablet (500 mg total) by mouth daily with breakfast. 30 tablet 0  . metoprolol (TOPROL-XL) 200 MG 24 hr tablet Take 1 tablet (200 mg total) by mouth daily. 90 tablet 1  . potassium chloride (K-DUR) 10 MEQ tablet Take 1 tablet (10 mEq total) by mouth daily. 90 tablet 1  . traMADol (ULTRAM) 50 MG tablet Take 1 tablet (50 mg total) by mouth every 6 (six) hours as needed. 30 tablet 0  .  Vitamin D, Ergocalciferol, (DRISDOL) 50000 units CAPS capsule Take 1 capsule (50,000 Units total) by mouth every 7 (seven) days. 4 capsule 0   No current facility-administered medications on file prior to visit.      Objective:  Objective  Physical Exam  Constitutional: She is oriented to person, place, and time. She appears well-developed and well-nourished. No distress.  HENT:  Head: Normocephalic and atraumatic.  Right Ear: External ear normal.  Left Ear: External ear normal.  Nose: Nose normal.    Mouth/Throat: Oropharynx is clear and moist.  Eyes: Pupils are equal, round, and reactive to light. Conjunctivae and EOM are normal.  Neck: Normal range of motion. Neck supple. No JVD present. Carotid bruit is not present. No thyromegaly present.  Cardiovascular: Normal rate, regular rhythm and normal heart sounds.  No murmur heard. Pulmonary/Chest: Effort normal and breath sounds normal. No respiratory distress. She has no wheezes. She has no rales. She exhibits no tenderness.  Musculoskeletal: She exhibits no edema.  Neurological: She is alert and oriented to person, place, and time.  Psychiatric: She has a normal mood and affect. Her behavior is normal. Judgment and thought content normal.  Nursing note and vitals reviewed.  BP (!) 136/96   Pulse 72   Temp 98.5 F (36.9 C) (Oral)   Resp 16   Ht 5' 5.5" (1.664 m)   Wt 226 lb (102.5 kg)   LMP 08/17/2014 (Exact Date)   SpO2 99%   BMI 37.04 kg/m  Wt Readings from Last 3 Encounters:  08/13/17 226 lb (102.5 kg)  07/27/17 227 lb (103 kg)  07/15/17 225 lb 6.4 oz (102.2 kg)     Lab Results  Component Value Date   WBC 8.1 11/05/2016   HGB 13.4 11/05/2016   HCT 40.1 11/05/2016   PLT 449.0 (H) 10/05/2016   GLUCOSE 98 07/15/2017   CHOL 190 07/15/2017   TRIG 125.0 07/15/2017   HDL 43.00 07/15/2017   LDLCALC 122 (H) 07/15/2017   ALT 11 07/15/2017   AST 10 07/15/2017   NA 140 07/15/2017   K 3.6 07/15/2017   CL 104 07/15/2017   CREATININE 0.71 07/15/2017   BUN 16 07/15/2017   CO2 30 07/15/2017   TSH 1.570 11/05/2016   HGBA1C 5.8 (H) 11/05/2016   MICROALBUR 0.6 05/01/2016    No results found.   Assessment & Plan:  Plan  I am having Gail Cardinal. Chandler maintain her escitalopram, cyclobenzaprine, traMADol, amLODipine, furosemide, levocetirizine, LORazepam, metoprolol, potassium chloride, buPROPion, metFORMIN, Vitamin D (Ergocalciferol), and Diclofenac Sodium.  No orders of the defined types were placed in this  encounter.   Problem List Items Addressed This Visit    None    Visit Diagnoses    Preventative health care    -  Primary   Relevant Orders   CBC with Differential/Platelet   Comprehensive metabolic panel   Lipid panel   TSH   QuantiFERON-TB Gold Plus   DM (diabetes mellitus) type II, controlled, with peripheral vascular disorder ([place])       Relevant Orders   Hemoglobin A1c    see avs Check labs ghm utd  Follow-up: Return in about 1 year (around 08/14/2018), or if symptoms worsen or fail to improve.  Ann Held, DO

## 2017-08-13 NOTE — Telephone Encounter (Signed)
Patient seen today for ov

## 2017-08-13 NOTE — Patient Instructions (Signed)
Preventive Care 40-64 Years, Female Preventive care refers to lifestyle choices and visits with your health care provider that can promote health and wellness. What does preventive care include?  A yearly physical exam. This is also called an annual well check.  Dental exams once or twice a year.  Routine eye exams. Ask your health care provider how often you should have your eyes checked.  Personal lifestyle choices, including: ? Daily care of your teeth and gums. ? Regular physical activity. ? Eating a healthy diet. ? Avoiding tobacco and drug use. ? Limiting alcohol use. ? Practicing safe sex. ? Taking low-dose aspirin daily starting at age 58. ? Taking vitamin and mineral supplements as recommended by your health care provider. What happens during an annual well check? The services and screenings done by your health care provider during your annual well check will depend on your age, overall health, lifestyle risk factors, and family history of disease. Counseling Your health care provider may ask you questions about your:  Alcohol use.  Tobacco use.  Drug use.  Emotional well-being.  Home and relationship well-being.  Sexual activity.  Eating habits.  Work and work Statistician.  Method of birth control.  Menstrual cycle.  Pregnancy history.  Screening You may have the following tests or measurements:  Height, weight, and BMI.  Blood pressure.  Lipid and cholesterol levels. These may be checked every 5 years, or more frequently if you are over 81 years old.  Skin check.  Lung cancer screening. You may have this screening every year starting at age 78 if you have a 30-pack-year history of smoking and currently smoke or have quit within the past 15 years.  Fecal occult blood test (FOBT) of the stool. You may have this test every year starting at age 65.  Flexible sigmoidoscopy or colonoscopy. You may have a sigmoidoscopy every 5 years or a colonoscopy  every 10 years starting at age 30.  Hepatitis C blood test.  Hepatitis B blood test.  Sexually transmitted disease (STD) testing.  Diabetes screening. This is done by checking your blood sugar (glucose) after you have not eaten for a while (fasting). You may have this done every 1-3 years.  Mammogram. This may be done every 1-2 years. Talk to your health care provider about when you should start having regular mammograms. This may depend on whether you have a family history of breast cancer.  BRCA-related cancer screening. This may be done if you have a family history of breast, ovarian, tubal, or peritoneal cancers.  Pelvic exam and Pap test. This may be done every 3 years starting at age 80. Starting at age 36, this may be done every 5 years if you have a Pap test in combination with an HPV test.  Bone density scan. This is done to screen for osteoporosis. You may have this scan if you are at high risk for osteoporosis.  Discuss your test results, treatment options, and if necessary, the need for more tests with your health care provider. Vaccines Your health care provider may recommend certain vaccines, such as:  Influenza vaccine. This is recommended every year.  Tetanus, diphtheria, and acellular pertussis (Tdap, Td) vaccine. You may need a Td booster every 10 years.  Varicella vaccine. You may need this if you have not been vaccinated.  Zoster vaccine. You may need this after age 5.  Measles, mumps, and rubella (MMR) vaccine. You may need at least one dose of MMR if you were born in  1957 or later. You may also need a second dose.  Pneumococcal 13-valent conjugate (PCV13) vaccine. You may need this if you have certain conditions and were not previously vaccinated.  Pneumococcal polysaccharide (PPSV23) vaccine. You may need one or two doses if you smoke cigarettes or if you have certain conditions.  Meningococcal vaccine. You may need this if you have certain  conditions.  Hepatitis A vaccine. You may need this if you have certain conditions or if you travel or work in places where you may be exposed to hepatitis A.  Hepatitis B vaccine. You may need this if you have certain conditions or if you travel or work in places where you may be exposed to hepatitis B.  Haemophilus influenzae type b (Hib) vaccine. You may need this if you have certain conditions.  Talk to your health care provider about which screenings and vaccines you need and how often you need them. This information is not intended to replace advice given to you by your health care provider. Make sure you discuss any questions you have with your health care provider. Document Released: 02/08/2015 Document Revised: 10/02/2015 Document Reviewed: 11/13/2014 Elsevier Interactive Patient Education  2018 Elsevier Inc.  

## 2017-08-15 LAB — QUANTIFERON-TB GOLD PLUS
Mitogen-NIL: >10 IU/mL
NIL: 0.04 IU/mL
QuantiFERON-TB Gold Plus: NEGATIVE
TB1-NIL: 0.06 IU/mL
TB2-NIL: 0.07 IU/mL

## 2017-08-17 NOTE — Addendum Note (Signed)
Addended byDamita Dunnings D on: 08/17/2017 08:18 AM   Modules accepted: Orders

## 2017-08-19 ENCOUNTER — Telehealth: Payer: Self-pay | Admitting: Family Medicine

## 2017-08-19 ENCOUNTER — Encounter: Payer: Self-pay | Admitting: *Deleted

## 2017-08-19 NOTE — Telephone Encounter (Signed)
Left message on phone that letter is ready for pickup

## 2017-08-19 NOTE — Telephone Encounter (Signed)
Copied from [place] [id number]. Topic: Inquiry >> Aug 19, 2017  8:17 AM Margot Ables wrote: Reason for CRM: pt needs copy of recent TB test with results to give to her employer. Pt would like to know if she can pick up today. She is requesting a call back.

## 2017-08-23 ENCOUNTER — Encounter

## 2017-08-23 ENCOUNTER — Encounter: Payer: BC Managed Care – PPO | Admitting: Family Medicine

## 2017-08-31 ENCOUNTER — Ambulatory Visit: Payer: BC Managed Care – PPO | Admitting: Family Medicine

## 2018-01-14 ENCOUNTER — Ambulatory Visit: Payer: BC Managed Care – PPO | Admitting: Family Medicine

## 2018-01-28 ENCOUNTER — Ambulatory Visit: Payer: BC Managed Care – PPO | Admitting: Family Medicine

## 2018-01-28 ENCOUNTER — Encounter: Payer: Self-pay | Admitting: Family Medicine

## 2018-01-28 DIAGNOSIS — I10 Essential (primary) hypertension: Secondary | ICD-10-CM

## 2018-01-28 LAB — CBC WITH DIFFERENTIAL/PLATELET
Basophils Absolute: 0.1 10*3/uL (ref 0.0–0.1)
Basophils Relative: 0.8 % (ref 0.0–3.0)
Eosinophils Absolute: 0.1 10*3/uL (ref 0.0–0.7)
Eosinophils Relative: 1 % (ref 0.0–5.0)
HCT: 42.8 % (ref 36.0–46.0)
Hemoglobin: 14.4 g/dL (ref 12.0–15.0)
Lymphocytes Relative: 36.2 % (ref 12.0–46.0)
Lymphs Abs: 3 10*3/uL (ref 0.7–4.0)
MCHC: 33.6 g/dL (ref 30.0–36.0)
MCV: 93.4 fl (ref 78.0–100.0)
Monocytes Absolute: 0.5 10*3/uL (ref 0.1–1.0)
Monocytes Relative: 5.9 % (ref 3.0–12.0)
Neutro Abs: 4.6 10*3/uL (ref 1.4–7.7)
Neutrophils Relative %: 56.1 % (ref 43.0–77.0)
Platelets: 483 10*3/uL — ABNORMAL HIGH (ref 150.0–400.0)
RBC: 4.59 Mil/uL (ref 3.87–5.11)
RDW: 14.1 % (ref 11.5–15.5)
WBC: 8.2 10*3/uL (ref 4.0–10.5)

## 2018-01-28 LAB — COMPREHENSIVE METABOLIC PANEL
ALT: 12 U/L (ref 0–35)
AST: 11 U/L (ref 0–37)
Albumin: 4.3 g/dL (ref 3.5–5.2)
Alkaline Phosphatase: 52 U/L (ref 39–117)
BUN: 9 mg/dL (ref 6–23)
CO2: 29 mEq/L (ref 19–32)
Calcium: 9.4 mg/dL (ref 8.4–10.5)
Chloride: 102 mEq/L (ref 96–112)
Creatinine, Ser: 0.63 mg/dL (ref 0.40–1.20)
GFR: 130.32 mL/min (ref >60.00)
Glucose, Bld: 100 mg/dL — ABNORMAL HIGH (ref 70–99)
Potassium: 4.4 mEq/L (ref 3.5–5.1)
Sodium: 139 mEq/L (ref 135–145)
Total Bilirubin: 0.4 mg/dL (ref 0.2–1.2)
Total Protein: 7 g/dL (ref 6.0–8.3)

## 2018-01-28 LAB — LIPID PANEL
Cholesterol: 193 mg/dL (ref 0–200)
HDL: 54.1 mg/dL (ref >39.00)
LDL Cholesterol: 116 mg/dL — ABNORMAL HIGH (ref 0–99)
NonHDL: 138.45
Total CHOL/HDL Ratio: 4
Triglycerides: 111 mg/dL (ref 0.0–149.0)
VLDL: 22.2 mg/dL (ref 0.0–40.0)

## 2018-01-28 LAB — TSH: TSH: 1.31 u[IU]/mL (ref 0.35–4.50)

## 2018-01-28 MED ORDER — AMLODIPINE BESYLATE 10 MG PO TABS: 10.0000 mg | ORAL_TABLET | Freq: Every day | ORAL | 1 refills | Status: DC

## 2018-01-28 MED ORDER — METOPROLOL SUCCINATE ER 200 MG PO TB24: 200.0000 mg | ORAL_TABLET | Freq: Every day | ORAL | 1 refills | Status: DC

## 2018-01-28 MED ORDER — METOPROLOL SUCCINATE ER 200 MG PO TB24: ORAL_TABLET | ORAL | 2 refills | Status: DC

## 2018-01-28 MED FILL — AMLODIPINE BESYLATE 10 MG T: 10 | 90 days supply | Qty: 90 | Fill #0

## 2018-01-28 MED FILL — METOPROLOL SUCCINATE ER 200: 200 | 30 days supply | Qty: 45 | Fill #0

## 2018-01-28 MED FILL — FUROSEMIDE 20 MG TAB: 20 | 90 days supply | Qty: 90 | Fill #1

## 2018-01-28 MED FILL — POTASSIUM CHL ER M10 TABLET: 10 | 90 days supply | Qty: 90 | Fill #1

## 2018-01-28 NOTE — Patient Instructions (Signed)
DASH Eating Plan  DASH stands for "Dietary Approaches to Stop Hypertension." The DASH eating plan is a healthy eating plan that has been shown to reduce high blood pressure (hypertension). It may also reduce your risk for type 2 diabetes, heart disease, and stroke. The DASH eating plan may also help with weight loss.  What are tips for following this plan?    General guidelines   Avoid eating more than 2,300 mg (milligrams) of salt (sodium) a day. If you have hypertension, you may need to reduce your sodium intake to 1,500 mg a day.   Limit alcohol intake to no more than 1 drink a day for nonpregnant women and 2 drinks a day for men. One drink equals 12 oz of beer, 5 oz of wine, or 1 oz of hard liquor.   Work with your health care provider to maintain a healthy body weight or to lose weight. Ask what an ideal weight is for you.   Get at least 30 minutes of exercise that causes your heart to beat faster (aerobic exercise) most days of the week. Activities may include walking, swimming, or biking.   Work with your health care provider or diet and nutrition specialist (dietitian) to adjust your eating plan to your individual calorie needs.  Reading food labels     Check food labels for the amount of sodium per serving. Choose foods with less than 5 percent of the Daily Value of sodium. Generally, foods with less than 300 mg of sodium per serving fit into this eating plan.   To find whole grains, look for the word "whole" as the first word in the ingredient list.  Shopping   Buy products labeled as "low-sodium" or "no salt added."   Buy fresh foods. Avoid canned foods and premade or frozen meals.  Cooking   Avoid adding salt when cooking. Use salt-free seasonings or herbs instead of table salt or sea salt. Check with your health care provider or pharmacist before using salt substitutes.   Do not fry foods. Cook foods using healthy methods such as baking, boiling, grilling, and broiling instead.   Cook with  heart-healthy oils, such as olive, canola, soybean, or sunflower oil.  Meal planning   Eat a balanced diet that includes:  ? 5 or more servings of fruits and vegetables each day. At each meal, try to fill half of your plate with fruits and vegetables.  ? Up to 6-8 servings of whole grains each day.  ? Less than 6 oz of lean meat, poultry, or fish each day. A 3-oz serving of meat is about the same size as a deck of cards. One egg equals 1 oz.  ? 2 servings of low-fat dairy each day.  ? A serving of nuts, seeds, or beans 5 times each week.  ? Heart-healthy fats. Healthy fats called Omega-3 fatty acids are found in foods such as flaxseeds and coldwater fish, like sardines, salmon, and mackerel.   Limit how much you eat of the following:  ? Canned or prepackaged foods.  ? Food that is high in trans fat, such as fried foods.  ? Food that is high in saturated fat, such as fatty meat.  ? Sweets, desserts, sugary drinks, and other foods with added sugar.  ? Full-fat dairy products.   Do not salt foods before eating.   Try to eat at least 2 vegetarian meals each week.   Eat more home-cooked food and less restaurant, buffet, and fast food.     When eating at a restaurant, ask that your food be prepared with less salt or no salt, if possible.  What foods are recommended?  The items listed may not be a complete list. Talk with your dietitian about what dietary choices are best for you.  Grains  Whole-grain or whole-wheat bread. Whole-grain or whole-wheat pasta. Brown rice. Oatmeal. Quinoa. Bulgur. Whole-grain and low-sodium cereals. Pita bread. Low-fat, low-sodium crackers. Whole-wheat flour tortillas.  Vegetables  Fresh or frozen vegetables (raw, steamed, roasted, or grilled). Low-sodium or reduced-sodium tomato and vegetable juice. Low-sodium or reduced-sodium tomato sauce and tomato paste. Low-sodium or reduced-sodium canned vegetables.  Fruits  All fresh, dried, or frozen fruit. Canned fruit in natural juice (without  added sugar).  Meat and other protein foods  Skinless chicken or turkey. Ground chicken or turkey. Pork with fat trimmed off. Fish and seafood. Egg whites. Dried beans, peas, or lentils. Unsalted nuts, nut butters, and seeds. Unsalted canned beans. Lean cuts of beef with fat trimmed off. Low-sodium, lean deli meat.  Dairy  Low-fat (1%) or fat-free (skim) milk. Fat-free, low-fat, or reduced-fat cheeses. Nonfat, low-sodium ricotta or cottage cheese. Low-fat or nonfat yogurt. Low-fat, low-sodium cheese.  Fats and oils  Soft margarine without trans fats. Vegetable oil. Low-fat, reduced-fat, or light mayonnaise and salad dressings (reduced-sodium). Canola, safflower, olive, soybean, and sunflower oils. Avocado.  Seasoning and other foods  Herbs. Spices. Seasoning mixes without salt. Unsalted popcorn and pretzels. Fat-free sweets.  What foods are not recommended?  The items listed may not be a complete list. Talk with your dietitian about what dietary choices are best for you.  Grains  Baked goods made with fat, such as croissants, muffins, or some breads. Dry pasta or rice meal packs.  Vegetables  Creamed or fried vegetables. Vegetables in a cheese sauce. Regular canned vegetables (not low-sodium or reduced-sodium). Regular canned tomato sauce and paste (not low-sodium or reduced-sodium). Regular tomato and vegetable juice (not low-sodium or reduced-sodium). Pickles. Olives.  Fruits  Canned fruit in a light or heavy syrup. Fried fruit. Fruit in cream or butter sauce.  Meat and other protein foods  Fatty cuts of meat. Ribs. Fried meat. Bacon. Sausage. Bologna and other processed lunch meats. Salami. Fatback. Hotdogs. Bratwurst. Salted nuts and seeds. Canned beans with added salt. Canned or smoked fish. Whole eggs or egg yolks. Chicken or turkey with skin.  Dairy  Whole or 2% milk, cream, and half-and-half. Whole or full-fat cream cheese. Whole-fat or sweetened yogurt. Full-fat cheese. Nondairy creamers. Whipped toppings.  Processed cheese and cheese spreads.  Fats and oils  Butter. Stick margarine. Lard. Shortening. Ghee. Bacon fat. Tropical oils, such as coconut, palm kernel, or palm oil.  Seasoning and other foods  Salted popcorn and pretzels. Onion salt, garlic salt, seasoned salt, table salt, and sea salt. Worcestershire sauce. Tartar sauce. Barbecue sauce. Teriyaki sauce. Soy sauce, including reduced-sodium. Steak sauce. Canned and packaged gravies. Fish sauce. Oyster sauce. Cocktail sauce. Horseradish that you find on the shelf. Ketchup. Mustard. Meat flavorings and tenderizers. Bouillon cubes. Hot sauce and Tabasco sauce. Premade or packaged marinades. Premade or packaged taco seasonings. Relishes. Regular salad dressings.  Where to find more information:   National Heart, Lung, and Blood Institute: www.nhlbi.nih.gov   American Heart Association: www.heart.org  Summary   The DASH eating plan is a healthy eating plan that has been shown to reduce high blood pressure (hypertension). It may also reduce your risk for type 2 diabetes, heart disease, and stroke.   With the   DASH eating plan, you should limit salt (sodium) intake to 2,300 mg a day. If you have hypertension, you may need to reduce your sodium intake to 1,500 mg a day.   When on the DASH eating plan, aim to eat more fresh fruits and vegetables, whole grains, lean proteins, low-fat dairy, and heart-healthy fats.   Work with your health care provider or diet and nutrition specialist (dietitian) to adjust your eating plan to your individual calorie needs.  This information is not intended to replace advice given to you by your health care provider. Make sure you discuss any questions you have with your health care provider.  Document Released: 01/01/2011 Document Revised: 01/06/2016 Document Reviewed: 01/06/2016  Elsevier Interactive Patient Education  2019 [employer]

## 2018-01-28 NOTE — Progress Notes (Signed)
Patient ID: [name], female    DOB: 1970-12-24  Age: 48 y.o. MRN: [id number]    Subjective:  Subjective  HPI AYONNA SPERANZA presents for bp f/u.   It has been running high.  Pt admits to a lot of stress at home/ work.  She denies depression/ suicidal thoughts.   No cp, sob, palpitations.    Review of Systems  Constitutional: Negative for appetite change, diaphoresis, fatigue and unexpected weight change.  Eyes: Negative for pain, redness and visual disturbance.  Respiratory: Negative for cough, chest tightness, shortness of breath and wheezing.   Cardiovascular: Negative for chest pain, palpitations and leg swelling.  Endocrine: Negative for cold intolerance, heat intolerance, polydipsia, polyphagia and polyuria.  Genitourinary: Negative for difficulty urinating, dysuria and frequency.  Neurological: Negative for dizziness, light-headedness, numbness and headaches.  Psychiatric/Behavioral: Positive for dysphoric mood and sleep disturbance. Negative for suicidal ideas.    History Past Medical History:  Diagnosis Date  . Anemia   . Anxiety   . Arthritis   . Depression   . Fatigue   . Fibroids    uterine  . Hypertension   . Insulin resistance   . Joint pain   . Menorrhagia   . Pre-diabetes   . Umbilical hernia   . Vitamin D deficiency     She has a past surgical history that includes Novasure ablation (06-20-2008); Hysteroscopy w/D&C (06-20-2008); tubal ligation (06-20-2008); Cryotherapy (1989); Cesarean section; TMJ Arthroplasty (1998); Robotic assisted total hysterectomy (N/A, 08/30/4625); and Umbilical hernia repair (N/A, 08/30/2014).   Her family history includes Anxiety disorder in her father; Breast cancer in her maternal aunt and mother; Cancer in her maternal aunt and mother; Diabetes in her mother; Hyperlipidemia in her father and mother; Hypertension in her father and mother; Obesity in her mother.She reports that she has never smoked. She has never used smokeless tobacco.  She reports that she does not drink alcohol or use drugs.  Current Outpatient Medications on File Prior to Visit  Medication Sig Dispense Refill  . buPROPion (WELLBUTRIN SR) 150 MG 12 hr tablet Take 1 tablet (150 mg total) by mouth daily. 30 tablet 0  . Diclofenac Sodium 2 % SOLN Place 2 g onto the skin 2 (two) times daily. 112 g 3  . escitalopram (LEXAPRO) 10 MG tablet Take 1 tablet (10 mg total) by mouth daily. 30 tablet 2  . furosemide (LASIX) 20 MG tablet Take 1 tablet (20 mg total) by mouth daily. 90 tablet 1  . LORazepam (ATIVAN) 1 MG tablet Take 1 tablet (1 mg total) by mouth at bedtime. 30 tablet 0  . potassium chloride (K-DUR) 10 MEQ tablet Take 1 tablet (10 mEq total) by mouth daily. 90 tablet 1   No current facility-administered medications on file prior to visit.      Objective:  Objective  Physical Exam Vitals signs and nursing note reviewed.  Constitutional:      Appearance: She is well-developed.  HENT:     Head: Normocephalic and atraumatic.  Eyes:     Conjunctiva/sclera: Conjunctivae normal.  Neck:     Musculoskeletal: Normal range of motion and neck supple.     Thyroid: No thyromegaly.     Vascular: No carotid bruit or JVD.  Cardiovascular:     Rate and Rhythm: Normal rate and regular rhythm.     Heart sounds: Normal heart sounds. No murmur.  Pulmonary:     Effort: Pulmonary effort is normal. No respiratory distress.  Breath sounds: Normal breath sounds. No wheezing or rales.  Chest:     Chest wall: No tenderness.  Neurological:     Mental Status: She is alert and oriented to person, place, and time.  Psychiatric:        Attention and Perception: Attention normal.        Mood and Affect: Mood is depressed. Affect is tearful.        Behavior: Behavior normal.        Thought Content: Thought content normal.        Cognition and Memory: Cognition normal.     Comments: Pt states she is not depressed     BP (!) 180/118   Pulse (!) 116   Temp 98.2 F  (36.8 C) (Oral)   Resp 16   Ht 5\' 6"  (1.676 m)   Wt 231 lb (104.8 kg)   LMP 08/17/2014 (Exact Date)   SpO2 98%   BMI 37.28 kg/m  Wt Readings from Last 3 Encounters:  01/28/18 231 lb (104.8 kg)  08/13/17 226 lb (102.5 kg)  07/27/17 227 lb (103 kg)     Lab Results  Component Value Date   WBC 8.0 08/13/2017   HGB 13.7 08/13/2017   HCT 40.9 08/13/2017   PLT 456.0 (H) 08/13/2017   GLUCOSE 120 (H) 08/13/2017   CHOL 175 08/13/2017   TRIG 182.0 (H) 08/13/2017   HDL 41.40 08/13/2017   LDLCALC 97 08/13/2017   ALT 12 08/13/2017   AST 12 08/13/2017   NA 140 08/13/2017   K 3.8 08/13/2017   CL 104 08/13/2017   CREATININE 0.80 08/13/2017   BUN 14 08/13/2017   CO2 31 08/13/2017   TSH 1.02 08/13/2017   HGBA1C 6.1 08/13/2017   MICROALBUR 0.6 05/01/2016    ekg-- sinus,    Assessment & Plan:  Plan  I have discontinued Kristen Cardinal. Wherry's cyclobenzaprine, traMADol, levocetirizine, metoprolol, metFORMIN, Vitamin D (Ergocalciferol), and metoprolol. I am also having her start on metoprolol. Additionally, I am having her maintain her escitalopram, furosemide, LORazepam, potassium chloride, buPROPion, Diclofenac Sodium, and amLODipine.  Meds ordered this encounter  Medications  . DISCONTD: metoprolol (TOPROL-XL) 200 MG 24 hr tablet    Sig: Take 1 tablet (200 mg total) by mouth daily.    Dispense:  90 tablet    Refill:  1    D/c previous script for 150mg   . amLODipine (NORVASC) 10 MG tablet    Sig: Take 1 tablet (10 mg total) by mouth daily.    Dispense:  90 tablet    Refill:  1  . metoprolol (TOPROL XL) 200 MG 24 hr tablet    Sig: 1 1/2 tab po qd    Dispense:  45 tablet    Refill:  2    Problem List Items Addressed This Visit      Unprioritized   Essential hypertension   Relevant Medications   amLODipine (NORVASC) 10 MG tablet   metoprolol (TOPROL XL) 200 MG 24 hr tablet   Other Relevant Orders   EKG 12-Lead (Completed)   ECHOCARDIOGRAM COMPLETE   CBC with  Differential/Platelet   Lipid panel   Comprehensive metabolic panel   TSH    Poorly controlled will alter medications, encouraged DASH diet, minimize caffeine and obtain adequate sleep. Report concerning symptoms and follow up as directed and as needed  Follow-up: Return in about 3 weeks (around 02/18/2018) for hypertension.  Ann Held, DO

## 2018-02-05 ENCOUNTER — Other Ambulatory Visit: Payer: Self-pay | Admitting: Family Medicine

## 2018-02-05 DIAGNOSIS — E785 Hyperlipidemia, unspecified: Secondary | ICD-10-CM

## 2018-02-05 DIAGNOSIS — R739 Hyperglycemia, unspecified: Secondary | ICD-10-CM

## 2018-02-18 ENCOUNTER — Ambulatory Visit: Payer: BC Managed Care – PPO | Admitting: Family Medicine

## 2018-02-18 ENCOUNTER — Encounter: Payer: Self-pay | Admitting: Family Medicine

## 2018-02-18 VITALS — BP 132/88 | HR 120 | Temp 99.2°F | Resp 16 | Ht 66.0 in | Wt 229.6 lb

## 2018-02-18 DIAGNOSIS — I10 Essential (primary) hypertension: Secondary | ICD-10-CM

## 2018-02-18 NOTE — Progress Notes (Signed)
Patient ID: [name], female    DOB: 04/11/1970  Age: 48 y.o. MRN: [id number]    Subjective:  Subjective  HPI [name] presents for bp f/u.  running 117-140/80    She stopped the metoprolol a few days ago -- she thought it was making her bp high.  No other complaints.  Review of Systems  Constitutional: Negative for appetite change, chills, diaphoresis, fatigue, fever and unexpected weight change.  HENT: Negative for congestion and hearing loss.   Eyes: Negative for pain, discharge, redness and visual disturbance.  Respiratory: Negative for cough, chest tightness, shortness of breath and wheezing.   Cardiovascular: Negative for chest pain, palpitations and leg swelling.  Gastrointestinal: Negative for abdominal pain, blood in stool, constipation, diarrhea, nausea and vomiting.  Endocrine: Negative for cold intolerance, heat intolerance, polydipsia, polyphagia and polyuria.  Genitourinary: Negative for difficulty urinating, dysuria, frequency, hematuria and urgency.  Musculoskeletal: Negative for back pain and myalgias.  Skin: Negative for rash.  Allergic/Immunologic: Negative for environmental allergies.  Neurological: Negative for dizziness, weakness, light-headedness, numbness and headaches.  Hematological: Does not bruise/bleed easily.  Psychiatric/Behavioral: Negative for suicidal ideas. The patient is not nervous/anxious.     History Past Medical History:  Diagnosis Date  . Anemia   . Anxiety   . Arthritis   . Depression   . Fatigue   . Fibroids    uterine  . Hypertension   . Insulin resistance   . Joint pain   . Menorrhagia   . Pre-diabetes   . Umbilical hernia   . Vitamin D deficiency     She has a past surgical history that includes Novasure ablation (06-20-2008); Hysteroscopy w/D&C (06-20-2008); tubal ligation (06-20-2008); Cryotherapy (1989); Cesarean section; TMJ Arthroplasty (1998); Robotic assisted total hysterectomy (N/A, 06/28/7856); and Umbilical hernia  repair (N/A, 08/30/2014).   Her family history includes Anxiety disorder in her father; Breast cancer in her maternal aunt and mother; Cancer in her maternal aunt and mother; Diabetes in her mother; Hyperlipidemia in her father and mother; Hypertension in her father and mother; Obesity in her mother.She reports that she has never smoked. She has never used smokeless tobacco. She reports that she does not drink alcohol or use drugs.  Current Outpatient Medications on File Prior to Visit  Medication Sig Dispense Refill  . amLODipine (NORVASC) 10 MG tablet Take 1 tablet (10 mg total) by mouth daily. 90 tablet 1  . buPROPion (WELLBUTRIN SR) 150 MG 12 hr tablet Take 1 tablet (150 mg total) by mouth daily. 30 tablet 0  . Diclofenac Sodium 2 % SOLN Place 2 g onto the skin 2 (two) times daily. 112 g 3  . escitalopram (LEXAPRO) 10 MG tablet Take 1 tablet (10 mg total) by mouth daily. 30 tablet 2  . furosemide (LASIX) 20 MG tablet Take 1 tablet (20 mg total) by mouth daily. 90 tablet 1  . LORazepam (ATIVAN) 1 MG tablet Take 1 tablet (1 mg total) by mouth at bedtime. 30 tablet 0  . metoprolol (TOPROL XL) 200 MG 24 hr tablet 1 1/2 tab po qd 45 tablet 2  . potassium chloride (K-DUR) 10 MEQ tablet Take 1 tablet (10 mEq total) by mouth daily. 90 tablet 1   No current facility-administered medications on file prior to visit.      Objective:  Objective  Physical Exam Vitals signs and nursing note reviewed.  Constitutional:      Appearance: She is well-developed.  HENT:     Head: Normocephalic  and atraumatic.  Eyes:     Conjunctiva/sclera: Conjunctivae normal.  Neck:     Musculoskeletal: Normal range of motion and neck supple.     Thyroid: No thyromegaly.     Vascular: No carotid bruit or JVD.  Cardiovascular:     Rate and Rhythm: Normal rate and regular rhythm.     Heart sounds: Normal heart sounds. No murmur.  Pulmonary:     Effort: Pulmonary effort is normal. No respiratory distress.     Breath  sounds: Normal breath sounds. No wheezing or rales.  Chest:     Chest wall: No tenderness.  Neurological:     Mental Status: She is alert and oriented to person, place, and time.    BP 132/88 (BP Location: Right Arm, Cuff Size: Large)   Pulse (!) 120   Temp 99.2 F (37.3 C) (Oral)   Resp 16   Ht 5\' 6"  (1.676 m)   Wt 229 lb 9.6 oz (104.1 kg)   LMP 08/17/2014 (Exact Date)   SpO2 98%   BMI 37.06 kg/m  Wt Readings from Last 3 Encounters:  02/18/18 229 lb 9.6 oz (104.1 kg)  01/28/18 231 lb (104.8 kg)  08/13/17 226 lb (102.5 kg)     Lab Results  Component Value Date   WBC 8.2 01/28/2018   HGB 14.4 01/28/2018   HCT 42.8 01/28/2018   PLT 483.0 (H) 01/28/2018   GLUCOSE 100 (H) 01/28/2018   CHOL 193 01/28/2018   TRIG 111.0 01/28/2018   HDL 54.10 01/28/2018   LDLCALC 116 (H) 01/28/2018   ALT 12 01/28/2018   AST 11 01/28/2018   NA 139 01/28/2018   K 4.4 01/28/2018   CL 102 01/28/2018   CREATININE 0.63 01/28/2018   BUN 9 01/28/2018   CO2 29 01/28/2018   TSH 1.31 01/28/2018   HGBA1C 6.1 08/13/2017   MICROALBUR 0.6 05/01/2016    No results found.   Assessment & Plan:  Plan  I am having [name]. [name] maintain her escitalopram, furosemide, LORazepam, potassium chloride, buPROPion, Diclofenac Sodium, amLODipine, and metoprolol.  No orders of the defined types were placed in this encounter.   Problem List Items Addressed This Visit      Unprioritized   Essential hypertension - Primary    Poorly controlled will alter medications, encouraged DASH diet, minimize caffeine and obtain adequate sleep. Report concerning symptoms and follow up as directed and as needed Pt instructed to take norvasc and metoprolol F/u 3 months or sooner prn         Follow-up: Return in about 3 months (around 05/20/2018), or if symptoms worsen or fail to improve, for hypertension.  Ann Held, DO

## 2018-02-18 NOTE — Assessment & Plan Note (Signed)
F/u healthy weight and wellness

## 2018-02-18 NOTE — Patient Instructions (Signed)
DASH Eating Plan  DASH stands for "Dietary Approaches to Stop Hypertension." The DASH eating plan is a healthy eating plan that has been shown to reduce high blood pressure (hypertension). It may also reduce your risk for type 2 diabetes, heart disease, and stroke. The DASH eating plan may also help with weight loss.  What are tips for following this plan?    General guidelines   Avoid eating more than 2,300 mg (milligrams) of salt (sodium) a day. If you have hypertension, you may need to reduce your sodium intake to 1,500 mg a day.   Limit alcohol intake to no more than 1 drink a day for nonpregnant women and 2 drinks a day for men. One drink equals 12 oz of beer, 5 oz of wine, or 1 oz of hard liquor.   Work with your health care provider to maintain a healthy body weight or to lose weight. Ask what an ideal weight is for you.   Get at least 30 minutes of exercise that causes your heart to beat faster (aerobic exercise) most days of the week. Activities may include walking, swimming, or biking.   Work with your health care provider or diet and nutrition specialist (dietitian) to adjust your eating plan to your individual calorie needs.  Reading food labels     Check food labels for the amount of sodium per serving. Choose foods with less than 5 percent of the Daily Value of sodium. Generally, foods with less than 300 mg of sodium per serving fit into this eating plan.   To find whole grains, look for the word "whole" as the first word in the ingredient list.  Shopping   Buy products labeled as "low-sodium" or "no salt added."   Buy fresh foods. Avoid canned foods and premade or frozen meals.  Cooking   Avoid adding salt when cooking. Use salt-free seasonings or herbs instead of table salt or sea salt. Check with your health care provider or pharmacist before using salt substitutes.   Do not fry foods. Cook foods using healthy methods such as baking, boiling, grilling, and broiling instead.   Cook with  heart-healthy oils, such as olive, canola, soybean, or sunflower oil.  Meal planning   Eat a balanced diet that includes:  ? 5 or more servings of fruits and vegetables each day. At each meal, try to fill half of your plate with fruits and vegetables.  ? Up to 6-8 servings of whole grains each day.  ? Less than 6 oz of lean meat, poultry, or fish each day. A 3-oz serving of meat is about the same size as a deck of cards. One egg equals 1 oz.  ? 2 servings of low-fat dairy each day.  ? A serving of nuts, seeds, or beans 5 times each week.  ? Heart-healthy fats. Healthy fats called Omega-3 fatty acids are found in foods such as flaxseeds and coldwater fish, like sardines, salmon, and mackerel.   Limit how much you eat of the following:  ? Canned or prepackaged foods.  ? Food that is high in trans fat, such as fried foods.  ? Food that is high in saturated fat, such as fatty meat.  ? Sweets, desserts, sugary drinks, and other foods with added sugar.  ? Full-fat dairy products.   Do not salt foods before eating.   Try to eat at least 2 vegetarian meals each week.   Eat more home-cooked food and less restaurant, buffet, and fast food.     When eating at a restaurant, ask that your food be prepared with less salt or no salt, if possible.  What foods are recommended?  The items listed may not be a complete list. Talk with your dietitian about what dietary choices are best for you.  Grains  Whole-grain or whole-wheat bread. Whole-grain or whole-wheat pasta. Brown rice. Oatmeal. Quinoa. Bulgur. Whole-grain and low-sodium cereals. Pita bread. Low-fat, low-sodium crackers. Whole-wheat flour tortillas.  Vegetables  Fresh or frozen vegetables (raw, steamed, roasted, or grilled). Low-sodium or reduced-sodium tomato and vegetable juice. Low-sodium or reduced-sodium tomato sauce and tomato paste. Low-sodium or reduced-sodium canned vegetables.  Fruits  All fresh, dried, or frozen fruit. Canned fruit in natural juice (without  added sugar).  Meat and other protein foods  Skinless chicken or turkey. Ground chicken or turkey. Pork with fat trimmed off. Fish and seafood. Egg whites. Dried beans, peas, or lentils. Unsalted nuts, nut butters, and seeds. Unsalted canned beans. Lean cuts of beef with fat trimmed off. Low-sodium, lean deli meat.  Dairy  Low-fat (1%) or fat-free (skim) milk. Fat-free, low-fat, or reduced-fat cheeses. Nonfat, low-sodium ricotta or cottage cheese. Low-fat or nonfat yogurt. Low-fat, low-sodium cheese.  Fats and oils  Soft margarine without trans fats. Vegetable oil. Low-fat, reduced-fat, or light mayonnaise and salad dressings (reduced-sodium). Canola, safflower, olive, soybean, and sunflower oils. Avocado.  Seasoning and other foods  Herbs. Spices. Seasoning mixes without salt. Unsalted popcorn and pretzels. Fat-free sweets.  What foods are not recommended?  The items listed may not be a complete list. Talk with your dietitian about what dietary choices are best for you.  Grains  Baked goods made with fat, such as croissants, muffins, or some breads. Dry pasta or rice meal packs.  Vegetables  Creamed or fried vegetables. Vegetables in a cheese sauce. Regular canned vegetables (not low-sodium or reduced-sodium). Regular canned tomato sauce and paste (not low-sodium or reduced-sodium). Regular tomato and vegetable juice (not low-sodium or reduced-sodium). Pickles. Olives.  Fruits  Canned fruit in a light or heavy syrup. Fried fruit. Fruit in cream or butter sauce.  Meat and other protein foods  Fatty cuts of meat. Ribs. Fried meat. Bacon. Sausage. Bologna and other processed lunch meats. Salami. Fatback. Hotdogs. Bratwurst. Salted nuts and seeds. Canned beans with added salt. Canned or smoked fish. Whole eggs or egg yolks. Chicken or turkey with skin.  Dairy  Whole or 2% milk, cream, and half-and-half. Whole or full-fat cream cheese. Whole-fat or sweetened yogurt. Full-fat cheese. Nondairy creamers. Whipped toppings.  Processed cheese and cheese spreads.  Fats and oils  Butter. Stick margarine. Lard. Shortening. Ghee. Bacon fat. Tropical oils, such as coconut, palm kernel, or palm oil.  Seasoning and other foods  Salted popcorn and pretzels. Onion salt, garlic salt, seasoned salt, table salt, and sea salt. Worcestershire sauce. Tartar sauce. Barbecue sauce. Teriyaki sauce. Soy sauce, including reduced-sodium. Steak sauce. Canned and packaged gravies. Fish sauce. Oyster sauce. Cocktail sauce. Horseradish that you find on the shelf. Ketchup. Mustard. Meat flavorings and tenderizers. Bouillon cubes. Hot sauce and Tabasco sauce. Premade or packaged marinades. Premade or packaged taco seasonings. Relishes. Regular salad dressings.  Where to find more information:   National Heart, Lung, and Blood Institute: www.nhlbi.nih.gov   American Heart Association: www.heart.org  Summary   The DASH eating plan is a healthy eating plan that has been shown to reduce high blood pressure (hypertension). It may also reduce your risk for type 2 diabetes, heart disease, and stroke.   With the   DASH eating plan, you should limit salt (sodium) intake to 2,300 mg a day. If you have hypertension, you may need to reduce your sodium intake to 1,500 mg a day.   When on the DASH eating plan, aim to eat more fresh fruits and vegetables, whole grains, lean proteins, low-fat dairy, and heart-healthy fats.   Work with your health care provider or diet and nutrition specialist (dietitian) to adjust your eating plan to your individual calorie needs.  This information is not intended to replace advice given to you by your health care provider. Make sure you discuss any questions you have with your health care provider.  Document Released: 01/01/2011 Document Revised: 01/06/2016 Document Reviewed: 01/06/2016  Elsevier Interactive Patient Education  2019 [employer]

## 2018-02-19 NOTE — Assessment & Plan Note (Signed)
Poorly controlled will alter medications, encouraged DASH diet, minimize caffeine and obtain adequate sleep. Report concerning symptoms and follow up as directed and as needed Pt instructed to take norvasc and metoprolol F/u 3 months or sooner prn

## 2018-04-01 ENCOUNTER — Telehealth: Payer: BC Managed Care – PPO | Admitting: Family

## 2018-04-01 ENCOUNTER — Telehealth: Payer: Self-pay | Admitting: Family Medicine

## 2018-04-01 DIAGNOSIS — B373 Candidiasis of vulva and vagina: Secondary | ICD-10-CM

## 2018-04-01 DIAGNOSIS — B3731 Acute candidiasis of vulva and vagina: Secondary | ICD-10-CM

## 2018-04-01 MED ORDER — FLUCONAZOLE 150 MG PO TABS: 150.0000 mg | ORAL_TABLET | Freq: Once | ORAL | 0 refills | Status: AC

## 2018-04-01 MED FILL — FLUCONAZOLE 150 MG TABS: 150 | 1 days supply | Qty: 1 | Fill #0

## 2018-04-01 NOTE — Progress Notes (Signed)
Greater than 5 minutes, yet less than 10 minutes of time have been spent researching, coordinating, and implementing care for this patient today.  Thank you for the details you included in the comment boxes. Those details are very helpful in determining the best course of treatment for you and help us to provide the best care.  We are sorry that you are not feeling well. Here is how we plan to help! Based on what you shared with me it looks like you: May have a yeast vaginosis  Vaginosis is an inflammation of the vagina that can result in discharge, itching and pain. The cause is usually a change in the normal balance of vaginal bacteria or an infection. Vaginosis can also result from reduced estrogen levels after menopause.  The most common causes of vaginosis are:   Bacterial vaginosis which results from an overgrowth of one on several organisms that are normally present in your vagina.   Yeast infections which are caused by a naturally occurring fungus called candida.   Vaginal atrophy (atrophic vaginosis) which results from the thinning of the vagina from reduced estrogen levels after menopause.   Trichomoniasis which is caused by a parasite and is commonly transmitted by sexual intercourse.  Factors that increase your risk of developing vaginosis include: . Medications, such as antibiotics and steroids . Uncontrolled diabetes . Use of hygiene products such as bubble bath, vaginal spray or vaginal deodorant . Douching . Wearing damp or tight-fitting clothing . Using an intrauterine device (IUD) for birth control . Hormonal changes, such as those associated with pregnancy, birth control pills or menopause . Sexual activity . Having a sexually transmitted infection  Your treatment plan is A single Diflucan (fluconazole) 150mg tablet once.  I have electronically sent this prescription into the pharmacy that you have chosen.  Be sure to take all of the medication as directed. Stop  taking any medication if you develop a rash, tongue swelling or shortness of breath. Mothers who are breast feeding should consider pumping and discarding their breast milk while on these antibiotics. However, there is no consensus that infant exposure at these doses would be harmful.  Remember that medication creams can weaken latex condoms. .   HOME CARE:  Good hygiene may prevent some types of vaginosis from recurring and may relieve some symptoms:  . Avoid baths, hot tubs and whirlpool spas. Rinse soap from your outer genital area after a shower, and dry the area well to prevent irritation. Don't use scented or harsh soaps, such as those with deodorant or antibacterial action. . Avoid irritants. These include scented tampons and pads. . Wipe from front to back after using the toilet. Doing so avoids spreading fecal bacteria to your vagina.  Other things that may help prevent vaginosis include:  . Don't douche. Your vagina doesn't require cleansing other than normal bathing. Repetitive douching disrupts the normal organisms that reside in the vagina and can actually increase your risk of vaginal infection. Douching won't clear up a vaginal infection. . Use a latex condom. Both female and female latex condoms may help you avoid infections spread by sexual contact. . Wear cotton underwear. Also wear pantyhose with a cotton crotch. If you feel comfortable without it, skip wearing underwear to bed. Yeast thrives in moist environments Your symptoms should improve in the next day or two.  GET HELP RIGHT AWAY IF:  . You have pain in your lower abdomen ( pelvic area or over your ovaries) . You develop nausea   or vomiting . You develop a fever . Your discharge changes or worsens . You have persistent pain with intercourse . You develop shortness of breath, a rapid pulse, or you faint.  These symptoms could be signs of problems or infections that need to be evaluated by a medical provider  now.  MAKE SURE YOU    Understand these instructions.  Will watch your condition.  Will get help right away if you are not doing well or get worse.  Your e-visit answers were reviewed by a board certified advanced clinical practitioner to complete your personal care plan. Depending upon the condition, your plan could have included both over the counter or prescription medications. Please review your pharmacy choice to make sure that you have choses a pharmacy that is open for you to pick up any needed prescription, Your safety is important to us. If you have drug allergies check your prescription carefully.   You can use MyChart to ask questions about today's visit, request a non-urgent call back, or ask for a work or school excuse for 24 hours related to this e-Visit. If it has been greater than 24 hours you will need to follow up with your provider, or enter a new e-Visit to address those concerns. You will get a MyChart message within the next two days asking about your experience. I hope that your e-visit has been valuable and will speed your recovery.  

## 2018-04-01 NOTE — Telephone Encounter (Signed)
Patient notified that she would need to come in or she can do an e-visit on mychart.

## 2018-04-01 NOTE — Telephone Encounter (Signed)
Copied from [place] [id number]. Topic: Quick Communication - See Telephone Encounter >> Apr 01, 2018  7:16 AM Burchel, Abbi R wrote: CRM for notification. See Telephone encounter for: 04/01/18.  Pt suspects she has a yeast infection, but cannot come in for appt today.  Pt requesting something be called in for her, as OTC meds don't usually work well for her. Please call pt to advise.   [place], [address] [place] [address] Phone: [phone] Fax: [phone]   Pt: [phone]

## 2018-04-08 ENCOUNTER — Ambulatory Visit: Payer: BC Managed Care – PPO | Admitting: Family Medicine

## 2018-04-08 ENCOUNTER — Encounter: Payer: Self-pay | Admitting: Family Medicine

## 2018-04-08 ENCOUNTER — Ambulatory Visit (INDEPENDENT_AMBULATORY_CARE_PROVIDER_SITE_OTHER)
Admission: RE | Admit: 2018-04-08 | Discharge: 2018-04-08 | Disposition: A | Discharge status: Home / Self Care | Payer: BC Managed Care – PPO | Source: Ambulatory Visit | Attending: Family Medicine | Admitting: Family Medicine

## 2018-04-08 ENCOUNTER — Other Ambulatory Visit: Payer: Self-pay

## 2018-04-08 VITALS — BP 170/110 | HR 81 | Resp 16 | Ht 66.0 in | Wt 230.0 lb

## 2018-04-08 DIAGNOSIS — M170 Bilateral primary osteoarthritis of knee: Secondary | ICD-10-CM

## 2018-04-08 MED ORDER — DICLOFENAC SODIUM 2 % TD SOLN: 1.0000 "application " | Freq: Two times a day (BID) | TRANSDERMAL | 3 refills | Status: DC

## 2018-04-08 NOTE — Patient Instructions (Signed)
Nice to meet you  Take tylenol 650 mg three times a day is the best evidence based medicine we have for arthritis.  Glucosamine sulfate 750mg  twice a day is a supplement that has been shown to help moderate to severe arthritis. Vitamin D 2000 IU daily Fish oil 2 grams daily.  Tumeric 500mg  twice daily.  Capsaicin topically up to four times a day may also help with pain. Please try icing the knees as well. Please try Aspercreme with lidocaine. I will call you with results from today. Please see me back in 3 to 4 weeks if no improvement.

## 2018-04-08 NOTE — Progress Notes (Signed)
[name] - 48 y.o. female MRN [id number]  Date of birth: May 29, 1970  SUBJECTIVE:  Including CC & ROS.  No chief complaint on file.   [name] is a 47 y.o. female that is acute on chronic worsening bilateral knee pain.  She is never received injections.  The pain is gotten worse over the past few weeks.  She has a history of being told that she has bone-on-bone arthritis.  The pain is throbbing in nature.  The pain is worse with any prolonged standing or walking.  She is not able do things that she would like to do.  She describes having the sensation that her knee may give out.  The pain is moderate to severe.  Pain is occurring over the medial joint line..   Review of Systems  Constitutional: Negative for fever.  HENT: Negative for congestion.   Respiratory: Negative for cough.   Cardiovascular: Negative for chest pain.  Gastrointestinal: Negative for abdominal distention.  Musculoskeletal: Positive for arthralgias.  Skin: Negative for color change.  Neurological: Negative for weakness.  Hematological: Negative for adenopathy.  Psychiatric/Behavioral: Negative for agitation.    HISTORY: Past Medical, Surgical, Social, and Family History Reviewed & Updated per EMR.   Pertinent Historical Findings include:  Past Medical History:  Diagnosis Date  . Anemia   . Anxiety   . Arthritis   . Depression   . Fatigue   . Fibroids    uterine  . Hypertension   . Insulin resistance   . Joint pain   . Menorrhagia   . Pre-diabetes   . Umbilical hernia   . Vitamin D deficiency     Past Surgical History:  Procedure Laterality Date  . CESAREAN SECTION    . CRYOTHERAPY  1989  . HYSTEROSCOPY W/D&C  06-20-2008  . [place] ABLATION  06-20-2008  . ROBOTIC ASSISTED TOTAL HYSTERECTOMY N/A 08/30/2014   Procedure: ROBOTIC ASSISTED ATTEMPTED, THEN CONVERSION TO OPEN TOTAL ABDOMINAL HYSTERECTOMY WITH BILATERAL SALPINGECTOMY;  Surgeon: Servando Salina, MD;  Location: [facility];  Service:  Gynecology;  Laterality: N/A;  . TMJ ARTHROPLASTY  1998   has screws in both side of jaw per pt  . tubal ligation  06-20-2008  . UMBILICAL HERNIA REPAIR N/A 08/30/2014   Procedure: HERNIA REPAIR UMBILICAL ADULT;  Surgeon: Autumn Messing III, MD;  Location: [facility];  Service: General;  Laterality: N/A;    Allergies  Allergen Reactions  . Lisinopril Swelling and Other (See Comments)    Cough     Family History  Problem Relation Age of Onset  . Breast cancer Mother   . Hypertension Mother   . Diabetes Mother   . Cancer Mother        breast  . Hyperlipidemia Mother   . Obesity Mother   . Hypertension Father   . Hyperlipidemia Father   . Anxiety disorder Father   . Breast cancer Maternal Aunt   . Cancer Maternal Aunt        beast     Social History   Socioeconomic History  . Marital status: Divorced    Spouse name: Not on file  . Number of children: 2  . Years of education: Not on file  . Highest education level: Not on file  Occupational History  . Occupation: Product manager: [employer]  . Financial resource strain: Not on file  . Food insecurity:    Worry: Not on file    Inability:  Not on file  . Transportation needs:    Medical: Not on file    Non-medical: Not on file  Tobacco Use  . Smoking status: Never Smoker  . Smokeless tobacco: Never Used  Substance and Sexual Activity  . Alcohol use: No  . Drug use: No  . Sexual activity: Yes    Birth control/protection: Surgical  Lifestyle  . Physical activity:    Days per week: Not on file    Minutes per session: Not on file  . Stress: Not on file  Relationships  . Social connections:    Talks on phone: Not on file    Gets together: Not on file    Attends religious service: Not on file    Active member of club or organization: Not on file    Attends meetings of clubs or organizations: Not on file    Relationship status: Not on file  . Intimate partner violence:    Fear of  current or ex partner: Not on file    Emotionally abused: Not on file    Physically abused: Not on file    Forced sexual activity: Not on file  Other Topics Concern  . Not on file  Social History Narrative  . Not on file     PHYSICAL EXAM:  VS: BP (!) 170/110   Pulse 81   Resp 16   Ht 5\' 6"  (1.676 m)   Wt 230 lb (104.3 kg)   LMP 08/17/2014 (Exact Date)   SpO2 97%   BMI 37.12 kg/m  Physical Exam Gen: NAD, alert, cooperative with exam, well-appearing ENT: normal lips, normal nasal mucosa,  Eye: normal EOM, normal conjunctiva and lids CV:  no edema, +2 pedal pulses   Resp: no accessory muscle use, non-labored,  Skin: no rashes, no areas of induration  Neuro: normal tone, normal sensation to touch Psych:  normal insight, alert and oriented MSK:  Left and right knee: Normal to inspection with no erythema or effusion or obvious bony abnormalities. Palpation normal with no warmth, Tenderness to palpation of the medial joint line bilaterally. ROM full in flexion and extension and lower leg rotation. Some instability with valgus varus stress testing. Negative Mcmurray's tests. Non painful patellar compression. Patellar glide without crepitus. Patellar and quadriceps tendons unremarkable. Hamstring and quadriceps strength is normal.  Neurovascularly intact    Aspiration/Injection Procedure Note [name] [birth year]  Procedure: Injection Indications: right knee pain   Procedure Details Consent: Risks of procedure as well as the alternatives and risks of each were explained to the (patient/caregiver).  Consent for procedure obtained. Time Out: Verified patient identification, verified procedure, site/side was marked, verified correct patient position, special equipment/implants available, medications/allergies/relevent history reviewed, required imaging and test results available.  Performed.  The area was cleaned with iodine and alcohol swabs.    The right knee superior  lateral suprapatellar pouch was injected using 1 cc's of 40 mg Kenalog and 4 cc's of 0.5% bupivacaine with a 21 2" needle.  Ultrasound was used. Images were obtained in long views showing the injection.     A sterile dressing was applied.  Patient did tolerate procedure well.   Aspiration/Injection Procedure Note ANI DEOLIVEIRA 1970-04-16  Procedure: Injection Indications: Left knee pain  Procedure Details Consent: Risks of procedure as well as the alternatives and risks of each were explained to the (patient/caregiver).  Consent for procedure obtained. Time Out: Verified patient identification, verified procedure, site/side was marked, verified correct patient position, special equipment/implants  available, medications/allergies/relevent history reviewed, required imaging and test results available.  Performed.  The area was cleaned with iodine and alcohol swabs.    The left knee superior lateral suprapatellar pouch was injected using 1 cc's of 40 mg Kenalog and 4 cc's of 0.5% bupivacaine with a 21 2" needle.  Ultrasound was used. Images were obtained in long views showing the injection.     A sterile dressing was applied.  Patient did tolerate procedure well.     ASSESSMENT & PLAN:   Degenerative arthritis of knee, bilateral Symptoms likely related to degenerative changes.  Has not had any injections up to this point. -Bilateral injections today. - pennsaid - xray  - referral to ortho so she can discuss surgery.

## 2018-04-08 NOTE — Assessment & Plan Note (Signed)
Symptoms likely related to degenerative changes.  Has not had any injections up to this point. -Bilateral injections today. - pennsaid - xray  - referral to ortho so she can discuss surgery.

## 2018-04-11 ENCOUNTER — Telehealth: Payer: Self-pay

## 2018-04-11 ENCOUNTER — Telehealth: Payer: Self-pay | Admitting: Family Medicine

## 2018-04-11 NOTE — Telephone Encounter (Signed)
Pt spoke with Dr. Raeford Razor and was given results and verbalized understanding.

## 2018-04-11 NOTE — Telephone Encounter (Signed)
Copied from [place] [id number]. Topic: General - Other >> Apr 11, 2018  9:02 AM Lionel December wrote: Reason for CRM: Pt called for her xray results. Would like a call back. 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

## 2018-04-11 NOTE — Telephone Encounter (Signed)
Left VM for patient. If she calls back please have her speak with a nurse/CMA and inform that the inside of each knee has severe degenerative changes. The [facility] can report results to patient.   If any questions then please take the best time and phone number to call and I will try to call her back.   Rosemarie Ax, MD [facility] 04/11/2018, 8:48 AM

## 2018-05-10 ENCOUNTER — Telehealth: Payer: Self-pay | Admitting: *Deleted

## 2018-05-10 ENCOUNTER — Other Ambulatory Visit: Payer: BC Managed Care – PPO

## 2018-05-10 NOTE — Telephone Encounter (Signed)
Copied from [place] [id number]. Topic: General - Other >> May 10, 2018  7:35 AM Carolyn Stare wrote:  Pt req a call back to reschedule her lab, she did show up today for that 7am appt today

## 2018-05-10 NOTE — Telephone Encounter (Signed)
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 She also stated that she would rather reschedule when things with Covid19 eases up unless Dr. Etter Sjogren needs the labs done.

## 2018-05-11 NOTE — Telephone Encounter (Signed)
She was supposed to f/u with [name] in office due to her bp being high-- i'm ok with waiting on labs -- does she have a way to check bp-- we can do doxy

## 2018-05-11 NOTE — Telephone Encounter (Signed)
FYI

## 2018-05-11 NOTE — Telephone Encounter (Signed)
Left message on machine to call to schedule appointment

## 2018-07-13 ENCOUNTER — Encounter: Payer: Self-pay | Admitting: Family Medicine

## 2018-07-18 ENCOUNTER — Telehealth: Payer: Self-pay | Admitting: Family Medicine

## 2018-07-18 ENCOUNTER — Encounter: Payer: Self-pay | Admitting: Family Medicine

## 2018-07-18 ENCOUNTER — Other Ambulatory Visit: Payer: Self-pay

## 2018-07-18 ENCOUNTER — Ambulatory Visit (INDEPENDENT_AMBULATORY_CARE_PROVIDER_SITE_OTHER): Payer: BC Managed Care – PPO | Admitting: Family Medicine

## 2018-07-18 VITALS — BP 115/89 | HR 81

## 2018-07-18 DIAGNOSIS — E876 Hypokalemia: Secondary | ICD-10-CM | POA: Diagnosis not present

## 2018-07-18 DIAGNOSIS — R739 Hyperglycemia, unspecified: Secondary | ICD-10-CM | POA: Diagnosis not present

## 2018-07-18 DIAGNOSIS — I10 Essential (primary) hypertension: Secondary | ICD-10-CM | POA: Diagnosis not present

## 2018-07-18 DIAGNOSIS — F411 Generalized anxiety disorder: Secondary | ICD-10-CM | POA: Diagnosis not present

## 2018-07-18 MED ORDER — METOPROLOL SUCCINATE ER 200 MG PO TB24: ORAL_TABLET | ORAL | 1 refills | Status: DC

## 2018-07-18 MED ORDER — POTASSIUM CHLORIDE ER 10 MEQ PO TBCR: 10.0000 meq | EXTENDED_RELEASE_TABLET | Freq: Every day | ORAL | 1 refills | Status: DC

## 2018-07-18 MED ORDER — AMLODIPINE BESYLATE 10 MG PO TABS: 10.0000 mg | ORAL_TABLET | Freq: Every day | ORAL | 1 refills | Status: DC

## 2018-07-18 MED ORDER — LORAZEPAM 1 MG PO TABS: 1.0000 mg | ORAL_TABLET | Freq: Every day | ORAL | 1 refills | Status: DC

## 2018-07-18 MED ORDER — FUROSEMIDE 20 MG PO TABS: 20.0000 mg | ORAL_TABLET | Freq: Every day | ORAL | 1 refills | Status: DC

## 2018-07-18 MED FILL — LORazepam 1 MG TABS: 1 | 30 days supply | Qty: 30 | Fill #0

## 2018-07-18 MED FILL — POTASSIUM CL ER 10 MEQ TAB: 10 | 90 days supply | Qty: 90 | Fill #0

## 2018-07-18 MED FILL — METOPROLOL SUCCINATE ER 200: 200 | 90 days supply | Qty: 135 | Fill #0

## 2018-07-18 MED FILL — AMLODIPINE BESYLATE 10 MG T: 10 | 90 days supply | Qty: 90 | Fill #0

## 2018-07-18 MED FILL — FUROSEMIDE 20 MG TAB: 20 | 90 days supply | Qty: 90 | Fill #0

## 2018-07-18 NOTE — Progress Notes (Signed)
Virtual Visit via Video Note  I connected with [name] on 07/18/18 at  8:15 AM EDT by a video enabled telemedicine application and verified that I am speaking with the correct person using two identifiers.  Location: Patient: home Provider: home   I discussed the limitations of evaluation and management by telemedicine and the availability of in person appointments. The patient expressed understanding and agreed to proceed.  History of Present Illness: Pt is home f/u bp.    she also needs refills on meds.  She is using the lorazepam about 2 x a week  Pt has no complaints.   Observations/Objective: Vitals:   07/18/18 0808  BP: 115/89  Pulse: 81  afebrile Pt is in NAD   Assessment and Plan: 1. Essential hypertension Well controlled, no changes to meds. Encouraged heart healthy diet such as the DASH diet and exercise as tolerated.  - amLODipine (NORVASC) 10 MG tablet; Take 1 tablet (10 mg total) by mouth daily.  Dispense: 90 tablet; Refill: 1 - metoprolol (TOPROL XL) 200 MG 24 hr tablet; 1 1/2 tab po qd  Dispense: 135 tablet; Refill: 1 - furosemide (LASIX) 20 MG tablet; Take 1 tablet (20 mg total) by mouth daily.  Dispense: 90 tablet; Refill: 1 - Lipid panel; Future  2. Hypokalemia Check labs  - potassium chloride (K-DUR) 10 MEQ tablet; Take 1 tablet (10 mEq total) by mouth daily.  Dispense: 90 tablet; Refill: 1 - Comprehensive metabolic panel; Future  3. Generalized anxiety disorder Stable  con't prn med She is only taking this about 2x a week  - LORazepam (ATIVAN) 1 MG tablet; Take 1 tablet (1 mg total) by mouth at bedtime.  Dispense: 30 tablet; Refill: 1  4. Hyperglycemia Recheck labs  con't to watch simple sugars and starches - Hemoglobin A1c; Future - Comprehensive metabolic panel; Future   Follow Up Instructions:    I discussed the assessment and treatment plan with the patient. The patient was provided an opportunity to ask questions and all were answered.  The patient agreed with the plan and demonstrated an understanding of the instructions.   The patient was advised to call back or seek an in-person evaluation if the symptoms worsen or if the condition fails to improve as anticipated.  I provided 15 minutes of non-face-to-face time during this encounter.   Ann Held, DO

## 2018-07-18 NOTE — Telephone Encounter (Signed)
Called pt to schedule lab appt in 1-2 wks and 6 month fu appt, pt will call to schedule since did not have calender with her.

## 2018-10-17 LAB — HM MAMMOGRAPHY

## 2018-10-21 ENCOUNTER — Encounter: Payer: Self-pay | Admitting: Family Medicine

## 2019-04-10 MED FILL — FUROSEMIDE 20 MG TAB: 20 | 90 days supply | Qty: 90 | Fill #1

## 2019-04-10 MED FILL — METOPROLOL SUCCINATE ER 200: 200 | 90 days supply | Qty: 135 | Fill #1

## 2019-04-10 MED FILL — AMLODIPINE BESYLATE 10 MG T: 10 | 90 days supply | Qty: 90 | Fill #1

## 2019-05-04 MED FILL — METOPROLOL SUCCINATE ER 200: 200 | 90 days supply | Qty: 135 | Fill #1

## 2019-05-04 MED FILL — AMLODIPINE BESYLATE 10 MG T: 10 | 90 days supply | Qty: 90 | Fill #1

## 2019-05-04 MED FILL — FUROSEMIDE 20 MG TAB: 20 | 90 days supply | Qty: 90 | Fill #1

## 2019-05-04 MED FILL — POTASSIUM CL ER 10 MEQ TAB: 10 | 90 days supply | Qty: 90 | Fill #1

## 2019-06-29 ENCOUNTER — Ambulatory Visit: Payer: BC Managed Care – PPO | Admitting: Family Medicine

## 2019-07-11 ENCOUNTER — Ambulatory Visit: Payer: BC Managed Care – PPO | Admitting: Family Medicine

## 2019-07-14 ENCOUNTER — Ambulatory Visit (INDEPENDENT_AMBULATORY_CARE_PROVIDER_SITE_OTHER): Payer: BC Managed Care – PPO | Admitting: Family Medicine

## 2019-07-14 ENCOUNTER — Other Ambulatory Visit: Payer: Self-pay

## 2019-07-14 ENCOUNTER — Encounter: Payer: Self-pay | Admitting: Family Medicine

## 2019-07-14 VITALS — BP 130/90 | HR 90 | Temp 97.4°F | Resp 18 | Ht 66.0 in | Wt 241.4 lb

## 2019-07-14 DIAGNOSIS — F329 Major depressive disorder, single episode, unspecified: Secondary | ICD-10-CM

## 2019-07-14 DIAGNOSIS — F32A Depression, unspecified: Secondary | ICD-10-CM

## 2019-07-14 DIAGNOSIS — I10 Essential (primary) hypertension: Secondary | ICD-10-CM

## 2019-07-14 DIAGNOSIS — M170 Bilateral primary osteoarthritis of knee: Secondary | ICD-10-CM

## 2019-07-14 DIAGNOSIS — E876 Hypokalemia: Secondary | ICD-10-CM

## 2019-07-14 DIAGNOSIS — F419 Anxiety disorder, unspecified: Secondary | ICD-10-CM

## 2019-07-14 DIAGNOSIS — F411 Generalized anxiety disorder: Secondary | ICD-10-CM

## 2019-07-14 LAB — COMPREHENSIVE METABOLIC PANEL
ALT: 13 U/L (ref 0–35)
AST: 13 U/L (ref 0–37)
Albumin: 4.2 g/dL (ref 3.5–5.2)
Alkaline Phosphatase: 56 U/L (ref 39–117)
BUN: 13 mg/dL (ref 6–23)
CO2: 29 mEq/L (ref 19–32)
Calcium: 9.2 mg/dL (ref 8.4–10.5)
Chloride: 104 mEq/L (ref 96–112)
Creatinine, Ser: 0.65 mg/dL (ref 0.40–1.20)
GFR: 117.54 mL/min (ref >60.00)
Glucose, Bld: 103 mg/dL — ABNORMAL HIGH (ref 70–99)
Potassium: 4 mEq/L (ref 3.5–5.1)
Sodium: 138 mEq/L (ref 135–145)
Total Bilirubin: 0.5 mg/dL (ref 0.2–1.2)
Total Protein: 6.6 g/dL (ref 6.0–8.3)

## 2019-07-14 LAB — CBC WITH DIFFERENTIAL/PLATELET
Basophils Absolute: 0.1 10*3/uL (ref 0.0–0.1)
Basophils Relative: 0.6 % (ref 0.0–3.0)
Eosinophils Absolute: 0.1 10*3/uL (ref 0.0–0.7)
Eosinophils Relative: 1 % (ref 0.0–5.0)
HCT: 39.3 % (ref 36.0–46.0)
Hemoglobin: 13.2 g/dL (ref 12.0–15.0)
Lymphocytes Relative: 34.1 % (ref 12.0–46.0)
Lymphs Abs: 3.1 10*3/uL (ref 0.7–4.0)
MCHC: 33.6 g/dL (ref 30.0–36.0)
MCV: 93.7 fl (ref 78.0–100.0)
Monocytes Absolute: 0.4 10*3/uL (ref 0.1–1.0)
Monocytes Relative: 4.8 % (ref 3.0–12.0)
Neutro Abs: 5.5 10*3/uL (ref 1.4–7.7)
Neutrophils Relative %: 59.5 % (ref 43.0–77.0)
Platelets: 399 10*3/uL (ref 150.0–400.0)
RBC: 4.19 Mil/uL (ref 3.87–5.11)
RDW: 14 % (ref 11.5–15.5)
WBC: 9.2 10*3/uL (ref 4.0–10.5)

## 2019-07-14 LAB — TSH: TSH: 2.19 u[IU]/mL (ref 0.35–4.50)

## 2019-07-14 LAB — LIPID PANEL
Cholesterol: 151 mg/dL (ref 0–200)
HDL: 39.3 mg/dL (ref >39.00)
LDL Cholesterol: 93 mg/dL (ref 0–99)
NonHDL: 112.16
Total CHOL/HDL Ratio: 4
Triglycerides: 94 mg/dL (ref 0.0–149.0)
VLDL: 18.8 mg/dL (ref 0.0–40.0)

## 2019-07-14 MED ORDER — LORAZEPAM 1 MG PO TABS: 1.0000 mg | ORAL_TABLET | Freq: Every day | ORAL | 1 refills | Status: DC

## 2019-07-14 MED ORDER — AMLODIPINE BESYLATE 10 MG PO TABS: 10.0000 mg | ORAL_TABLET | Freq: Every day | ORAL | 1 refills | Status: DC

## 2019-07-14 MED ORDER — ESCITALOPRAM OXALATE 10 MG PO TABS: 10.0000 mg | ORAL_TABLET | Freq: Every day | ORAL | 1 refills | Status: DC

## 2019-07-14 MED ORDER — BUPROPION HCL ER (SR) 150 MG PO TB12: 150.0000 mg | ORAL_TABLET | Freq: Every day | ORAL | 1 refills | Status: DC

## 2019-07-14 MED ORDER — NEBIVOLOL HCL 5 MG PO TABS: 5.0000 mg | ORAL_TABLET | Freq: Every day | ORAL | 1 refills | Status: DC

## 2019-07-14 MED ORDER — FUROSEMIDE 20 MG PO TABS: 20.0000 mg | ORAL_TABLET | Freq: Every day | ORAL | 1 refills | Status: DC

## 2019-07-14 MED ORDER — POTASSIUM CHLORIDE ER 10 MEQ PO TBCR: 10.0000 meq | EXTENDED_RELEASE_TABLET | Freq: Every day | ORAL | 1 refills | Status: DC

## 2019-07-14 MED FILL — BUPROPION HCL ER (SR) 150 M: 150 | 90 days supply | Qty: 90 | Fill #0

## 2019-07-14 MED FILL — POTASSIUM CL ER 10 MEQ TAB: 10 | 90 days supply | Qty: 90 | Fill #0

## 2019-07-14 MED FILL — FUROSEMIDE 20 MG TAB: 20 | 90 days supply | Qty: 90 | Fill #0

## 2019-07-14 MED FILL — ESCITALOPRAM 10 MG TABLET: 10 | 90 days supply | Qty: 90 | Fill #0

## 2019-07-14 MED FILL — AMLODIPINE BESYLATE 10 MG T: 10 | 90 days supply | Qty: 90 | Fill #0

## 2019-07-14 MED FILL — BYSTOLIC 5 MG TABLET: 5 | 30 days supply | Qty: 30 | Fill #0

## 2019-07-14 NOTE — Assessment & Plan Note (Signed)
Poorly controlled will alter medications, encouraged DASH diet, minimize caffeine and obtain adequate sleep. Report concerning symptoms and follow up as directed and as needed D/c metoprolol--- start bystolic 5 mg daily F/u 2-3 weeks -- bring bp cuff with you

## 2019-07-14 NOTE — Assessment & Plan Note (Signed)
F/u ortho Knee replacements pending

## 2019-07-14 NOTE — Assessment & Plan Note (Signed)
Restart lexapro and wellbutrin  Ativan prn  F/u 2-3 weeks

## 2019-07-14 NOTE — Patient Instructions (Signed)
DASH Eating Plan DASH stands for "Dietary Approaches to Stop Hypertension." The DASH eating plan is a healthy eating plan that has been shown to reduce high blood pressure (hypertension). It may also reduce your risk for type 2 diabetes, heart disease, and stroke. The DASH eating plan may also help with weight loss. What are tips for following this plan?  General guidelines  Avoid eating more than 2,300 mg (milligrams) of salt (sodium) a day. If you have hypertension, you may need to reduce your sodium intake to 1,500 mg a day.  Limit alcohol intake to no more than 1 drink a day for nonpregnant women and 2 drinks a day for men. One drink equals 12 oz of beer, 5 oz of wine, or 1 oz of hard liquor.  Work with your health care provider to maintain a healthy body weight or to lose weight. Ask what an ideal weight is for you.  Get at least 30 minutes of exercise that causes your heart to beat faster (aerobic exercise) most days of the week. Activities may include walking, swimming, or biking.  Work with your health care provider or diet and nutrition specialist (dietitian) to adjust your eating plan to your individual calorie needs. Reading food labels   Check food labels for the amount of sodium per serving. Choose foods with less than 5 percent of the Daily Value of sodium. Generally, foods with less than 300 mg of sodium per serving fit into this eating plan.  To find whole grains, look for the word "whole" as the first word in the ingredient list. Shopping  Buy products labeled as "low-sodium" or "no salt added."  Buy fresh foods. Avoid canned foods and premade or frozen meals. Cooking  Avoid adding salt when cooking. Use salt-free seasonings or herbs instead of table salt or sea salt. Check with your health care provider or pharmacist before using salt substitutes.  Do not fry foods. Cook foods using healthy methods such as baking, boiling, grilling, and broiling instead.  Cook with  heart-healthy oils, such as olive, canola, soybean, or sunflower oil. Meal planning  Eat a balanced diet that includes: ? 5 or more servings of fruits and vegetables each day. At each meal, try to fill half of your plate with fruits and vegetables. ? Up to 6-8 servings of whole grains each day. ? Less than 6 oz of lean meat, poultry, or fish each day. A 3-oz serving of meat is about the same size as a deck of cards. One egg equals 1 oz. ? 2 servings of low-fat dairy each day. ? A serving of nuts, seeds, or beans 5 times each week. ? Heart-healthy fats. Healthy fats called Omega-3 fatty acids are found in foods such as flaxseeds and coldwater fish, like sardines, salmon, and mackerel.  Limit how much you eat of the following: ? Canned or prepackaged foods. ? Food that is high in trans fat, such as fried foods. ? Food that is high in saturated fat, such as fatty meat. ? Sweets, desserts, sugary drinks, and other foods with added sugar. ? Full-fat dairy products.  Do not salt foods before eating.  Try to eat at least 2 vegetarian meals each week.  Eat more home-cooked food and less restaurant, buffet, and fast food.  When eating at a restaurant, ask that your food be prepared with less salt or no salt, if possible. What foods are recommended? The items listed may not be a complete list. Talk with your dietitian about   what dietary choices are best for you. Grains Whole-grain or whole-wheat bread. Whole-grain or whole-wheat pasta. Brown rice. Oatmeal. Quinoa. Bulgur. Whole-grain and low-sodium cereals. Pita bread. Low-fat, low-sodium crackers. Whole-wheat flour tortillas. Vegetables Fresh or frozen vegetables (raw, steamed, roasted, or grilled). Low-sodium or reduced-sodium tomato and vegetable juice. Low-sodium or reduced-sodium tomato sauce and tomato paste. Low-sodium or reduced-sodium canned vegetables. Fruits All fresh, dried, or frozen fruit. Canned fruit in natural juice (without  added sugar). Meat and other protein foods Skinless chicken or turkey. Ground chicken or turkey. Pork with fat trimmed off. Fish and seafood. Egg whites. Dried beans, peas, or lentils. Unsalted nuts, nut butters, and seeds. Unsalted canned beans. Lean cuts of beef with fat trimmed off. Low-sodium, lean deli meat. Dairy Low-fat (1%) or fat-free (skim) milk. Fat-free, low-fat, or reduced-fat cheeses. Nonfat, low-sodium ricotta or cottage cheese. Low-fat or nonfat yogurt. Low-fat, low-sodium cheese. Fats and oils Soft margarine without trans fats. Vegetable oil. Low-fat, reduced-fat, or light mayonnaise and salad dressings (reduced-sodium). Canola, safflower, olive, soybean, and sunflower oils. Avocado. Seasoning and other foods Herbs. Spices. Seasoning mixes without salt. Unsalted popcorn and pretzels. Fat-free sweets. What foods are not recommended? The items listed may not be a complete list. Talk with your dietitian about what dietary choices are best for you. Grains Baked goods made with fat, such as croissants, muffins, or some breads. Dry pasta or rice meal packs. Vegetables Creamed or fried vegetables. Vegetables in a cheese sauce. Regular canned vegetables (not low-sodium or reduced-sodium). Regular canned tomato sauce and paste (not low-sodium or reduced-sodium). Regular tomato and vegetable juice (not low-sodium or reduced-sodium). Pickles. Olives. Fruits Canned fruit in a light or heavy syrup. Fried fruit. Fruit in cream or butter sauce. Meat and other protein foods Fatty cuts of meat. Ribs. Fried meat. Bacon. Sausage. Bologna and other processed lunch meats. Salami. Fatback. Hotdogs. Bratwurst. Salted nuts and seeds. Canned beans with added salt. Canned or smoked fish. Whole eggs or egg yolks. Chicken or turkey with skin. Dairy Whole or 2% milk, cream, and half-and-half. Whole or full-fat cream cheese. Whole-fat or sweetened yogurt. Full-fat cheese. Nondairy creamers. Whipped toppings.  Processed cheese and cheese spreads. Fats and oils Butter. Stick margarine. Lard. Shortening. Ghee. Bacon fat. Tropical oils, such as coconut, palm kernel, or palm oil. Seasoning and other foods Salted popcorn and pretzels. Onion salt, garlic salt, seasoned salt, table salt, and sea salt. Worcestershire sauce. Tartar sauce. Barbecue sauce. Teriyaki sauce. Soy sauce, including reduced-sodium. Steak sauce. Canned and packaged gravies. Fish sauce. Oyster sauce. Cocktail sauce. Horseradish that you find on the shelf. Ketchup. Mustard. Meat flavorings and tenderizers. Bouillon cubes. Hot sauce and Tabasco sauce. Premade or packaged marinades. Premade or packaged taco seasonings. Relishes. Regular salad dressings. Where to find more information:  National Heart, Lung, and Blood Institute: www.nhlbi.nih.gov  American Heart Association: www.heart.org Summary  The DASH eating plan is a healthy eating plan that has been shown to reduce high blood pressure (hypertension). It may also reduce your risk for type 2 diabetes, heart disease, and stroke.  With the DASH eating plan, you should limit salt (sodium) intake to 2,300 mg a day. If you have hypertension, you may need to reduce your sodium intake to 1,500 mg a day.  When on the DASH eating plan, aim to eat more fresh fruits and vegetables, whole grains, lean proteins, low-fat dairy, and heart-healthy fats.  Work with your health care provider or diet and nutrition specialist (dietitian) to adjust your eating plan to your   individual calorie needs. This information is not intended to replace advice given to you by your health care provider. Make sure you discuss any questions you have with your health care provider. Document Revised: 12/25/2016 Document Reviewed: 01/06/2016 Elsevier Patient Education  2020 [employer]  

## 2019-07-14 NOTE — Progress Notes (Signed)
Patient ID: [name], female    DOB: Sep 15, 1970  Age: 49 y.o. MRN: [id number]    Subjective:  Subjective  HPI [name] presents for f/u bp .   She is waiting for knee replacements to be approved   She c/o swelling in legs at end of day that has been since her knees got so bad.   She has not been taking her anxiety meds but needs it  No other complaints   Review of Systems  Constitutional: Negative for appetite change, diaphoresis, fatigue and unexpected weight change.  Eyes: Negative for pain, redness and visual disturbance.  Respiratory: Negative for cough, chest tightness, shortness of breath and wheezing.   Cardiovascular: Positive for leg swelling. Negative for chest pain and palpitations.  Endocrine: Negative for cold intolerance, heat intolerance, polydipsia, polyphagia and polyuria.  Genitourinary: Negative for difficulty urinating, dysuria and frequency.  Neurological: Negative for dizziness, light-headedness, numbness and headaches.  Psychiatric/Behavioral: Negative for decreased concentration, self-injury, sleep disturbance and suicidal ideas. The patient is nervous/anxious.     History Past Medical History:  Diagnosis Date  . Anemia   . Anxiety   . Arthritis   . Depression   . Fatigue   . Fibroids    uterine  . Hypertension   . Insulin resistance   . Joint pain   . Menorrhagia   . Pre-diabetes   . Umbilical hernia   . Vitamin D deficiency     She has a past surgical history that includes Novasure ablation (06-20-2008); Hysteroscopy with D & C (06-20-2008); tubal ligation (06-20-2008); Cryotherapy (1989); Cesarean section; TMJ Arthroplasty (1998); Robotic assisted total hysterectomy (N/A, 02/04/3788); and Umbilical hernia repair (N/A, 08/30/2014).   Her family history includes Anxiety disorder in her father; Breast cancer in her maternal aunt and mother; Cancer in her maternal aunt and mother; Diabetes in her mother; Hyperlipidemia in her father and mother;  Hypertension in her father and mother; Obesity in her mother.She reports that she has never smoked. She has never used smokeless tobacco. She reports that she does not drink alcohol and does not use drugs.  Current Outpatient Medications on File Prior to Visit  Medication Sig Dispense Refill  . Diclofenac Sodium (PENNSAID) 2 % SOLN Place 1 application onto the skin 2 (two) times daily. 1 Bottle 3  . Diclofenac Sodium 2 % SOLN Place 2 g onto the skin 2 (two) times daily. 112 g 3   No current facility-administered medications on file prior to visit.     Objective:  Objective  Physical Exam Vitals and nursing note reviewed.  Constitutional:      Appearance: She is well-developed.  HENT:     Head: Normocephalic and atraumatic.  Eyes:     Conjunctiva/sclera: Conjunctivae normal.  Neck:     Thyroid: No thyromegaly.     Vascular: No carotid bruit or JVD.  Cardiovascular:     Rate and Rhythm: Normal rate and regular rhythm.     Heart sounds: Normal heart sounds. No murmur heard.   Pulmonary:     Effort: Pulmonary effort is normal. No respiratory distress.     Breath sounds: Normal breath sounds. No wheezing or rales.  Chest:     Chest wall: No tenderness.  Musculoskeletal:        General: Swelling present. No tenderness.     Cervical back: Normal range of motion and neck supple.     Right lower leg: 1+ Edema present.     Left lower  leg: 1+ Edema present.       Legs:  Neurological:     Mental Status: She is alert and oriented to person, place, and time.    BP 130/90 (BP Location: Left Arm, Patient Position: Sitting, Cuff Size: Large)   Pulse 90   Temp (!) 97.4 F (36.3 C) (Temporal)   Resp 18   Ht 5\' 6"  (1.676 m)   Wt 241 lb 6.4 oz (109.5 kg)   LMP 08/17/2014 (Exact Date)   SpO2 99%   BMI 38.96 kg/m  Wt Readings from Last 3 Encounters:  07/14/19 241 lb 6.4 oz (109.5 kg)  04/08/18 230 lb (104.3 kg)  02/18/18 229 lb 9.6 oz (104.1 kg)     Lab Results  Component Value  Date   WBC 8.2 01/28/2018   HGB 14.4 01/28/2018   HCT 42.8 01/28/2018   PLT 483.0 (H) 01/28/2018   GLUCOSE 100 (H) 01/28/2018   CHOL 193 01/28/2018   TRIG 111.0 01/28/2018   HDL 54.10 01/28/2018   LDLCALC 116 (H) 01/28/2018   ALT 12 01/28/2018   AST 11 01/28/2018   NA 139 01/28/2018   K 4.4 01/28/2018   CL 102 01/28/2018   CREATININE 0.63 01/28/2018   BUN 9 01/28/2018   CO2 29 01/28/2018   TSH 1.31 01/28/2018   HGBA1C 6.1 08/13/2017   MICROALBUR 0.6 05/01/2016    DG Knee Complete 4 Views Left  Result Date: 04/08/2018 CLINICAL DATA:  Chronic knee pain EXAM: LEFT KNEE - COMPLETE 4+ VIEW COMPARISON:  None. FINDINGS: Moderate narrowing of the medial femorotibial joint space. Small suprapatellar effusion. Minimal medial osteophyte formation. Patellofemoral joint space is preserved. No acute fracture or dislocation. 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 Electronically Signed   By: Ulyses Jarred M.D.   On: 04/08/2018 13:57   DG Knee Complete 4 Views Right  Result Date: 04/08/2018 CLINICAL DATA:  Right knee pain EXAM: RIGHT KNEE - COMPLETE 4+ VIEW COMPARISON:  None. FINDINGS: There is narrowing of the medial femorotibial joint space with medial osteophyte formation. Lateral femorotibial joint space is preserved. There is a small suprapatellar knee effusion. The patellofemoral space is normal. No acute fracture or dislocation IMPRESSION: Moderate medial compartment osteoarthrosis of the right knee with small suprapatellar effusion. Electronically Signed   By: Ulyses Jarred M.D.   On: 04/08/2018 13:55     Assessment & Plan:  Plan  I have discontinued Makinsey Pepitone. Kackley's metoprolol. I have also changed her potassium chloride. Additionally, I am having her start on nebivolol. Lastly, I am having her maintain her Diclofenac Sodium, Diclofenac Sodium, escitalopram, LORazepam, amLODipine, buPROPion, and furosemide.  Meds ordered this encounter  Medications  .  nebivolol (BYSTOLIC) 5 MG tablet    Sig: Take 1 tablet (5 mg total) by mouth daily.    Dispense:  30 tablet    Refill:  1  . escitalopram (LEXAPRO) 10 MG tablet    Sig: Take 1 tablet (10 mg total) by mouth daily.    Dispense:  90 tablet    Refill:  1  . LORazepam (ATIVAN) 1 MG tablet    Sig: Take 1 tablet (1 mg total) by mouth at bedtime.    Dispense:  30 tablet    Refill:  1  . potassium chloride (KLOR-CON) 10 MEQ tablet    Sig: Take 1 tablet (10 mEq total) by mouth daily.    Dispense:  90 tablet    Refill:  1  . amLODipine (NORVASC)  10 MG tablet    Sig: Take 1 tablet (10 mg total) by mouth daily.    Dispense:  90 tablet    Refill:  1  . buPROPion (WELLBUTRIN SR) 150 MG 12 hr tablet    Sig: Take 1 tablet (150 mg total) by mouth daily.    Dispense:  90 tablet    Refill:  1  . furosemide (LASIX) 20 MG tablet    Sig: Take 1 tablet (20 mg total) by mouth daily.    Dispense:  90 tablet    Refill:  1    Problem List Items Addressed This Visit      Unprioritized   Anxiety    Restart lexapro and wellbutrin  Ativan prn  F/u 2-3 weeks      Relevant Medications   escitalopram (LEXAPRO) 10 MG tablet   LORazepam (ATIVAN) 1 MG tablet   buPROPion (WELLBUTRIN SR) 150 MG 12 hr tablet   Degenerative arthritis of knee, bilateral - Primary    F/u ortho Knee replacements pending      Relevant Orders   Ambulatory referral to Physical Therapy   Depression   Relevant Medications   escitalopram (LEXAPRO) 10 MG tablet   LORazepam (ATIVAN) 1 MG tablet   buPROPion (WELLBUTRIN SR) 150 MG 12 hr tablet   Essential hypertension    Poorly controlled will alter medications, encouraged DASH diet, minimize caffeine and obtain adequate sleep. Report concerning symptoms and follow up as directed and as needed D/c metoprolol--- start bystolic 5 mg daily F/u 2-3 weeks -- bring bp cuff with you       Relevant Medications   nebivolol (BYSTOLIC) 5 MG tablet   amLODipine (NORVASC) 10 MG tablet    furosemide (LASIX) 20 MG tablet   Other Relevant Orders   Lipid panel   CBC with Differential/Platelet   TSH   Comprehensive metabolic panel   Generalized anxiety disorder   Relevant Medications   escitalopram (LEXAPRO) 10 MG tablet   LORazepam (ATIVAN) 1 MG tablet   buPROPion (WELLBUTRIN SR) 150 MG 12 hr tablet   Hypokalemia   Relevant Medications   potassium chloride (KLOR-CON) 10 MEQ tablet    Other Visit Diagnoses    Morbid obesity ([place])       Relevant Medications   buPROPion (WELLBUTRIN SR) 150 MG 12 hr tablet   Other Relevant Orders   Amb Ref to Medical Weight Management      Follow-up: Return if symptoms worsen or fail to improve, for annual exam, fasting.  Ann Held, DO

## 2019-07-19 ENCOUNTER — Ambulatory Visit: Payer: BC Managed Care – PPO | Admitting: Family Medicine

## 2019-07-19 NOTE — Progress Notes (Deleted)
[name] - 49 y.o. female MRN [id number]  Date of birth: 07-Feb-1970  SUBJECTIVE:  Including CC & ROS.  No chief complaint on file.   [name] is a 49 y.o. female that is  ***.  ***   Review of Systems See HPI   HISTORY: Past Medical, Surgical, Social, and Family History Reviewed & Updated per EMR.   Pertinent Historical Findings include:  Past Medical History:  Diagnosis Date  . Anemia   . Anxiety   . Arthritis   . Depression   . Fatigue   . Fibroids    uterine  . Hypertension   . Insulin resistance   . Joint pain   . Menorrhagia   . Pre-diabetes   . Umbilical hernia   . Vitamin D deficiency     Past Surgical History:  Procedure Laterality Date  . CESAREAN SECTION    . CRYOTHERAPY  1989  . HYSTEROSCOPY WITH D & C  06-20-2008  . [place] ABLATION  06-20-2008  . ROBOTIC ASSISTED TOTAL HYSTERECTOMY N/A 08/30/2014   Procedure: ROBOTIC ASSISTED ATTEMPTED, THEN CONVERSION TO OPEN TOTAL ABDOMINAL HYSTERECTOMY WITH BILATERAL SALPINGECTOMY;  Surgeon: Servando Salina, MD;  Location: [facility];  Service: Gynecology;  Laterality: N/A;  . TMJ ARTHROPLASTY  1998   has screws in both side of jaw per pt  . tubal ligation  06-20-2008  . UMBILICAL HERNIA REPAIR N/A 08/30/2014   Procedure: HERNIA REPAIR UMBILICAL ADULT;  Surgeon: Autumn Messing III, MD;  Location: [facility];  Service: General;  Laterality: N/A;    Family History  Problem Relation Age of Onset  . Breast cancer Mother   . Hypertension Mother   . Diabetes Mother   . Cancer Mother        breast  . Hyperlipidemia Mother   . Obesity Mother   . Hypertension Father   . Hyperlipidemia Father   . Anxiety disorder Father   . Breast cancer Maternal Aunt   . Cancer Maternal Aunt        beast    Social History   Socioeconomic History  . Marital status: Divorced    Spouse name: Not on file  . Number of children: 2  . Years of education: Not on file  . Highest education level: Not on file  Occupational History  .  Occupation: Product manager: [employer]  Tobacco Use  . Smoking status: Never Smoker  . Smokeless tobacco: Never Used  Substance and Sexual Activity  . Alcohol use: No  . Drug use: No  . Sexual activity: Yes    Birth control/protection: Surgical  Other Topics Concern  . Not on file  Social History Narrative  . Not on file   Social Determinants of Health   Financial Resource Strain:   . Difficulty of Paying Living Expenses:   Food Insecurity:   . Worried About Charity fundraiser in the Last Year:   . Arboriculturist in the Last Year:   Transportation Needs:   . Film/video editor (Medical):   Marland Kitchen Lack of Transportation (Non-Medical):   Physical Activity:   . Days of Exercise per Week:   . Minutes of Exercise per Session:   Stress:   . Feeling of Stress :   Social Connections:   . Frequency of Communication with Friends and Family:   . Frequency of Social Gatherings with Friends and Family:   . Attends Religious Services:   . Active Member  of Clubs or Organizations:   . Attends Archivist Meetings:   Marland Kitchen Marital Status:   Intimate Partner Violence:   . Fear of Current or Ex-Partner:   . Emotionally Abused:   Marland Kitchen Physically Abused:   . Sexually Abused:      PHYSICAL EXAM:  VS: LMP 08/17/2014 (Exact Date)  Physical Exam Gen: NAD, alert, cooperative with exam, well-appearing MSK:  ***      ASSESSMENT & PLAN:   No problem-specific Assessment & Plan notes found for this encounter.

## 2019-07-25 ENCOUNTER — Telehealth: Payer: Self-pay

## 2019-07-25 NOTE — Telephone Encounter (Signed)
Hope from [facility] called in to speak with a  referral coordinator for this patient about a referral that was placed by Dr. Etter Sjogren. Please call Hope back at [phone]

## 2019-07-28 NOTE — Telephone Encounter (Signed)
LM for call back about referral. Unsure what info is needed. I am leaving early today. Perhaps [name] can help if they call back.

## 2019-08-02 ENCOUNTER — Ambulatory Visit (INDEPENDENT_AMBULATORY_CARE_PROVIDER_SITE_OTHER): Payer: BC Managed Care – PPO | Admitting: Bariatrics

## 2019-08-03 ENCOUNTER — Other Ambulatory Visit: Payer: Self-pay

## 2019-08-03 ENCOUNTER — Encounter (INDEPENDENT_AMBULATORY_CARE_PROVIDER_SITE_OTHER): Payer: Self-pay | Admitting: Family Medicine

## 2019-08-03 ENCOUNTER — Ambulatory Visit (INDEPENDENT_AMBULATORY_CARE_PROVIDER_SITE_OTHER): Payer: BC Managed Care – PPO | Admitting: Family Medicine

## 2019-08-03 VITALS — BP 134/82 | HR 82 | Temp 97.9°F | Ht 65.0 in | Wt 240.0 lb

## 2019-08-03 DIAGNOSIS — R0602 Shortness of breath: Secondary | ICD-10-CM

## 2019-08-03 DIAGNOSIS — R5383 Other fatigue: Secondary | ICD-10-CM

## 2019-08-03 DIAGNOSIS — Z6841 Body Mass Index (BMI) 40.0 and over, adult: Secondary | ICD-10-CM

## 2019-08-03 DIAGNOSIS — Z9189 Other specified personal risk factors, not elsewhere classified: Secondary | ICD-10-CM

## 2019-08-03 DIAGNOSIS — Z1331 Encounter for screening for depression: Secondary | ICD-10-CM

## 2019-08-03 DIAGNOSIS — Z0289 Encounter for other administrative examinations: Secondary | ICD-10-CM

## 2019-08-03 DIAGNOSIS — R7303 Prediabetes: Secondary | ICD-10-CM | POA: Diagnosis not present

## 2019-08-03 DIAGNOSIS — E559 Vitamin D deficiency, unspecified: Secondary | ICD-10-CM | POA: Diagnosis not present

## 2019-08-03 DIAGNOSIS — E66813 Obesity, class 3: Secondary | ICD-10-CM

## 2019-08-04 LAB — COMPREHENSIVE METABOLIC PANEL
ALT: 15 IU/L (ref 0–32)
AST: 12 IU/L (ref 0–40)
Albumin/Globulin Ratio: 1.6 (ref 1.2–2.2)
Albumin: 4.1 g/dL (ref 3.8–4.8)
Alkaline Phosphatase: 65 IU/L (ref 48–121)
BUN/Creatinine Ratio: 16 (ref 9–23)
BUN: 10 mg/dL (ref 6–24)
Bilirubin Total: 0.2 mg/dL (ref 0.0–1.2)
CO2: 23 mmol/L (ref 20–29)
Calcium: 9.2 mg/dL (ref 8.7–10.2)
Chloride: 103 mmol/L (ref 96–106)
Creatinine, Ser: 0.63 mg/dL (ref 0.57–1.00)
GFR calc Af Amer: 123 mL/min/{1.73_m2} (ref >59)
GFR calc non Af Amer: 106 mL/min/{1.73_m2} (ref >59)
Globulin, Total: 2.6 g/dL (ref 1.5–4.5)
Glucose: 96 mg/dL (ref 65–99)
Potassium: 4.5 mmol/L (ref 3.5–5.2)
Sodium: 140 mmol/L (ref 134–144)
Total Protein: 6.7 g/dL (ref 6.0–8.5)

## 2019-08-04 LAB — VITAMIN D 25 HYDROXY (VIT D DEFICIENCY, FRACTURES): Vit D, 25-Hydroxy: 18.3 ng/mL — ABNORMAL LOW (ref 30.0–100.0)

## 2019-08-04 LAB — HEMOGLOBIN A1C
Est. average glucose Bld gHb Est-mCnc: 126 mg/dL
Hgb A1c MFr Bld: 6 % — ABNORMAL HIGH (ref 4.8–5.6)

## 2019-08-04 LAB — INSULIN, RANDOM: INSULIN: 19.2 u[IU]/mL (ref 2.6–24.9)

## 2019-08-04 LAB — VITAMIN B12: Vitamin B-12: 266 pg/mL (ref 232–1245)

## 2019-08-04 LAB — FOLATE: Folate: 6.7 ng/mL (ref >3.0)

## 2019-08-08 NOTE — Progress Notes (Signed)
Chief Complaint:   OBESITY DIERDRE Chandler (MR# [id number]) is a 49 y.o. female who presents for evaluation and treatment of obesity and related comorbidities. Current BMI is Body mass index is 39.94 kg/m. [name] has been struggling with her weight for many years and has been unsuccessful in either losing weight, maintaining weight loss, or reaching her healthy weight goal.  [name] is a returning patient to our clinic. Her last visit with [name] was approximately 2 years ago.  [name] is currently in the action stage of change and ready to dedicate time achieving and maintaining a healthier weight. [name] is interested in becoming our patient and working on intensive lifestyle modifications including (but not limited to) diet and exercise for weight loss.  [name]'s habits were reviewed today and are as follows: Her family eats meals together, she thinks her family will eat healthier with her, her desired weight loss is 70 lbs, she started gaining weight during marriage, her heaviest weight ever was 241 pounds, she has significant food cravings issues, she skips meals frequently, she is frequently drinking liquids with calories, she frequently makes poor food choices, she frequently eats larger portions than normal and she struggles with emotional eating.  Depression Screen [name]'s Food and Mood (modified PHQ-9) score was 18.  Depression screen PHQ 2/9 08/03/2019  Decreased Interest 3  Down, Depressed, Hopeless 3  PHQ - 2 Score 6  Altered sleeping 2  Tired, decreased energy 3  Change in appetite 1  Feeling bad or failure about yourself  1  Trouble concentrating 2  Moving slowly or fidgety/restless 3  Suicidal thoughts 0  PHQ-9 Score 18  Difficult doing work/chores Very difficult   Subjective:   1. Other fatigue Raley admits to daytime somnolence and admits to waking up still tired. [name] has a history of symptoms of daytime fatigue. [name] generally gets 4 or 8 hours of sleep per night, and  states that she has generally restful sleep. Snoring is present. Apneic episodes are not present. Epworth Sleepiness Score is 14.  2. Shortness of breath on exertion [name] notes increasing shortness of breath with exercising and seems to be worsening over time with weight gain. She notes getting out of breath sooner with activity than she used to. This has not gotten worse recently. [name] denies shortness of breath at rest or orthopnea.  3. Pre-diabetes [name] hasn't had an A1c recently. She notes polyphagia.  4. Vitamin D deficiency [name]'s Vit D has been low in the past. She is not on Vit D currently, and she notes fatigue.  5. At risk for diabetes mellitus [name] is at higher than average risk for developing diabetes due to her obesity.   Assessment/Plan:   1. Other fatigue [name] does feel that her weight is causing her energy to be lower than it should be. Fatigue may be related to obesity, depression or many other causes. Labs will be ordered, and in the meanwhile, [name] will focus on self care including making healthy food choices, increasing physical activity and focusing on stress reduction.  - EKG 12-Lead - Comprehensive metabolic panel - Vitamin E70 - Folate  2. Shortness of breath on exertion [name] does feel that she gets out of breath more easily that she used to when she exercises. [name]'s shortness of breath appears to be obesity related and exercise induced. She has agreed to work on weight loss and gradually increase exercise to treat her exercise induced shortness of breath. Will continue to monitor  closely.  3. Pre-diabetes [name] will continue start her journaling plan, and will continue to work on weight loss, exercise, and decreasing simple carbohydrates to help decrease the risk of diabetes. We will check labs today.  - Comprehensive metabolic panel - Hemoglobin A1c - Insulin, random  4. Vitamin D deficiency Low Vitamin D level contributes to fatigue and are  associated with obesity, breast, and colon cancer. We will check labs today. [name] will follow-up for routine testing of Vitamin D, at least 2-3 times per year to avoid over-replacement.  - VITAMIN D 25 Hydroxy (Vit-D Deficiency, Fractures)  5. Depression screening [name] had a positive depression screening. Depression is commonly associated with obesity and often results in emotional eating behaviors. We will monitor this closely and work on CBT to help improve the non-hunger eating patterns. Referral to Psychology may be required if no improvement is seen as she continues in our clinic.  6. At risk for diabetes mellitus [name] was given approximately 30 minutes of diabetes education and counseling today. We discussed intensive lifestyle modifications today with an emphasis on weight loss as well as increasing exercise and decreasing simple carbohydrates in her diet. We also reviewed medication options with an emphasis on risk versus benefit of those discussed.   Repetitive spaced learning was employed today to elicit superior memory formation and behavioral change.  7. Class 3 severe obesity with serious comorbidity and body mass index (BMI) of 40.0 to 44.9 in adult, unspecified obesity type (HCC) [name] is currently in the action stage of change and her goal is to continue with weight loss efforts. I recommend Vianney begin the structured treatment plan as follows:  She has agreed to keeping a food journal and adhering to recommended goals of 1500-1800 calories and 100 grams of protein daily.  Exercise goals: No exercise has been prescribed for now, while we concentrate on nutritional changes.   Behavioral modification strategies: increasing lean protein intake and meal planning and cooking strategies.  She was informed of the importance of frequent follow-up visits to maximize her success with intensive lifestyle modifications for her multiple health conditions. She was informed we would discuss  her lab results at her next visit unless there is a critical issue that needs to be addressed sooner. [name] agreed to keep her next visit at the agreed upon time to discuss these results.  Objective:   Blood pressure 134/82, pulse 82, temperature 97.9 F (36.6 C), temperature source Oral, height 5\' 5"  (1.651 m), weight 240 lb (108.9 kg), last menstrual period 08/17/2014, SpO2 98 %. Body mass index is 39.94 kg/m.  EKG: Normal sinus rhythm, rate 90 BPM.  Indirect Calorimeter completed today shows a VO2 of 319 and a REE of 2220.  Her calculated basal metabolic rate is 2774 thus her basal metabolic rate is better than expected.  General: Cooperative, alert, well developed, in no acute distress. HEENT: Conjunctivae and lids unremarkable. Cardiovascular: Regular rhythm.  Lungs: Normal work of breathing. Neurologic: No focal deficits.   Lab Results  Component Value Date   CREATININE 0.63 08/03/2019   BUN 10 08/03/2019   NA 140 08/03/2019   K 4.5 08/03/2019   CL 103 08/03/2019   CO2 23 08/03/2019   Lab Results  Component Value Date   ALT 15 08/03/2019   AST 12 08/03/2019   ALKPHOS 65 08/03/2019   BILITOT 0.2 08/03/2019   Lab Results  Component Value Date   HGBA1C 6.0 (H) 08/03/2019   HGBA1C 6.1 08/13/2017   HGBA1C  5.8 (H) 11/05/2016   Lab Results  Component Value Date   INSULIN 19.2 08/03/2019   INSULIN 12.9 11/05/2016   Lab Results  Component Value Date   TSH 2.19 07/14/2019   Lab Results  Component Value Date   CHOL 151 07/14/2019   HDL 39.30 07/14/2019   LDLCALC 93 07/14/2019   TRIG 94.0 07/14/2019   CHOLHDL 4 07/14/2019   Lab Results  Component Value Date   WBC 9.2 07/14/2019   HGB 13.2 07/14/2019   HCT 39.3 07/14/2019   MCV 93.7 07/14/2019   PLT 399.0 07/14/2019   No results found for: IRON, TIBC, FERRITIN  Attestation Statements:   Reviewed by clinician on day of visit: allergies, medications, problem list, medical history, surgical history, family  history, social history, and previous encounter notes.   I, Trixie Dredge, am acting as transcriptionist for Dennard Nip, MD.  I have reviewed the above documentation for accuracy and completeness, and I agree with the above. - Dennard Nip, MD

## 2019-08-10 ENCOUNTER — Encounter: Payer: Self-pay | Admitting: Family Medicine

## 2019-08-10 ENCOUNTER — Other Ambulatory Visit: Payer: Self-pay | Admitting: Family Medicine

## 2019-08-10 ENCOUNTER — Other Ambulatory Visit: Payer: Self-pay

## 2019-08-10 ENCOUNTER — Ambulatory Visit (INDEPENDENT_AMBULATORY_CARE_PROVIDER_SITE_OTHER): Payer: BC Managed Care – PPO | Admitting: Family Medicine

## 2019-08-10 VITALS — BP 136/100 | HR 98 | Temp 99.4°F | Resp 18 | Ht 65.0 in | Wt 240.6 lb

## 2019-08-10 DIAGNOSIS — I10 Essential (primary) hypertension: Secondary | ICD-10-CM

## 2019-08-10 DIAGNOSIS — F418 Other specified anxiety disorders: Secondary | ICD-10-CM | POA: Diagnosis not present

## 2019-08-10 MED ORDER — ESCITALOPRAM OXALATE 10 MG PO TABS: 10.0000 mg | ORAL_TABLET | Freq: Every day | ORAL | 1 refills | Status: DC

## 2019-08-10 NOTE — Patient Instructions (Signed)

## 2019-08-10 NOTE — Assessment & Plan Note (Addendum)
Well controlled, no changes to meds. Encouraged heart healthy diet such as the DASH diet and exercise as tolerated.  Repeat bp was improved  Home bp better  F/u 3 months

## 2019-08-10 NOTE — Progress Notes (Signed)
Patient ID: [name], female    DOB: May 10, 1970  Age: 49 y.o. MRN: [id number]    Subjective:  Subjective  HPI [name] presents for f/u bp   Pt is back at healthy weight and wellness   No complaints  Review of Systems  Constitutional: Negative for appetite change, diaphoresis, fatigue and unexpected weight change.  Eyes: Negative for pain, redness and visual disturbance.  Respiratory: Negative for cough, chest tightness, shortness of breath and wheezing.   Cardiovascular: Negative for chest pain, palpitations and leg swelling.  Endocrine: Negative for cold intolerance, heat intolerance, polydipsia, polyphagia and polyuria.  Genitourinary: Negative for difficulty urinating, dysuria and frequency.  Neurological: Negative for dizziness, light-headedness, numbness and headaches.    History Past Medical History:  Diagnosis Date  . Anemia   . Anxiety   . Arthritis   . Depression   . Fatigue   . Fibroids    uterine  . Hypertension   . Insulin resistance   . Joint pain   . Lower extremity edema   . Menorrhagia   . Pre-diabetes   . Umbilical hernia   . Vitamin D deficiency     She has a past surgical history that includes Novasure ablation (06-20-2008); Hysteroscopy with D & C (06-20-2008); tubal ligation (06-20-2008); Cryotherapy (1989); Cesarean section; TMJ Arthroplasty (1998); Robotic assisted total hysterectomy (N/A, 09/30/1738); and Umbilical hernia repair (N/A, 08/30/2014).   Her family history includes Anxiety disorder in her father; Breast cancer in her maternal aunt and mother; Cancer in her father, maternal aunt, and mother; Diabetes in her mother; Hyperlipidemia in her father and mother; Hypertension in her father and mother; Obesity in her mother; Thyroid disease in her mother.She reports that she has never smoked. She has never used smokeless tobacco. She reports that she does not drink alcohol and does not use drugs.  Current Outpatient Medications on File Prior to  Visit  Medication Sig Dispense Refill  . amLODipine (NORVASC) 10 MG tablet Take 1 tablet (10 mg total) by mouth daily. 90 tablet 1  . buPROPion (WELLBUTRIN SR) 150 MG 12 hr tablet Take 150 mg by mouth daily.    . furosemide (LASIX) 20 MG tablet Take 1 tablet (20 mg total) by mouth daily. 90 tablet 1  . LORazepam (ATIVAN) 1 MG tablet Take 1 tablet (1 mg total) by mouth at bedtime. 30 tablet 1  . potassium chloride (KLOR-CON) 10 MEQ tablet Take 1 tablet (10 mEq total) by mouth daily. 90 tablet 1   No current facility-administered medications on file prior to visit.     Objective:  Objective  Physical Exam Vitals and nursing note reviewed.  Constitutional:      Appearance: She is well-developed.  HENT:     Head: Normocephalic and atraumatic.  Eyes:     Conjunctiva/sclera: Conjunctivae normal.  Neck:     Thyroid: No thyromegaly.     Vascular: No carotid bruit or JVD.  Cardiovascular:     Rate and Rhythm: Normal rate and regular rhythm.     Heart sounds: Normal heart sounds. No murmur heard.   Pulmonary:     Effort: Pulmonary effort is normal. No respiratory distress.     Breath sounds: Normal breath sounds. No wheezing or rales.  Chest:     Chest wall: No tenderness.  Musculoskeletal:     Cervical back: Normal range of motion and neck supple.  Neurological:     Mental Status: She is alert and oriented to person, place,  and time.    BP (!) 136/100 (BP Location: Left Arm, Patient Position: Sitting, Cuff Size: Normal)   Pulse 98   Temp 99.4 F (37.4 C) (Temporal)   Resp 18   Ht 5\' 5"  (1.651 m)   Wt 240 lb 9.6 oz (109.1 kg)   LMP 08/17/2014 (Exact Date)   SpO2 97%   BMI 40.04 kg/m  Wt Readings from Last 3 Encounters:  08/10/19 240 lb 9.6 oz (109.1 kg)  08/03/19 240 lb (108.9 kg)  07/14/19 241 lb 6.4 oz (109.5 kg)     Lab Results  Component Value Date   WBC 9.2 07/14/2019   HGB 13.2 07/14/2019   HCT 39.3 07/14/2019   PLT 399.0 07/14/2019   GLUCOSE 96 08/03/2019     CHOL 151 07/14/2019   TRIG 94.0 07/14/2019   HDL 39.30 07/14/2019   LDLCALC 93 07/14/2019   ALT 15 08/03/2019   AST 12 08/03/2019   NA 140 08/03/2019   K 4.5 08/03/2019   CL 103 08/03/2019   CREATININE 0.63 08/03/2019   BUN 10 08/03/2019   CO2 23 08/03/2019   TSH 2.19 07/14/2019   HGBA1C 6.0 (H) 08/03/2019   MICROALBUR 0.6 05/01/2016    DG Knee Complete 4 Views Left  Result Date: 04/08/2018 CLINICAL DATA:  Chronic knee pain EXAM: LEFT KNEE - COMPLETE 4+ VIEW COMPARISON:  None. FINDINGS: Moderate narrowing of the medial femorotibial joint space. Small suprapatellar effusion. Minimal medial osteophyte formation. Patellofemoral joint space is preserved. No acute fracture or dislocation. 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 Electronically Signed   By: Ulyses Jarred M.D.   On: 04/08/2018 13:57   DG Knee Complete 4 Views Right  Result Date: 04/08/2018 CLINICAL DATA:  Right knee pain EXAM: RIGHT KNEE - COMPLETE 4+ VIEW COMPARISON:  None. FINDINGS: There is narrowing of the medial femorotibial joint space with medial osteophyte formation. Lateral femorotibial joint space is preserved. There is a small suprapatellar knee effusion. The patellofemoral space is normal. No acute fracture or dislocation IMPRESSION: Moderate medial compartment osteoarthrosis of the right knee with small suprapatellar effusion. Electronically Signed   By: Ulyses Jarred M.D.   On: 04/08/2018 13:55     Assessment & Plan:  Plan  I am having [name]. [name] start on escitalopram. I am also having her maintain her LORazepam, potassium chloride, amLODipine, furosemide, and buPROPion.  Meds ordered this encounter  Medications  . escitalopram (LEXAPRO) 10 MG tablet    Sig: Take 1 tablet (10 mg total) by mouth at bedtime.    Dispense:  90 tablet    Refill:  1    Problem List Items Addressed This Visit    None    Visit Diagnoses    Depression with anxiety    -  Primary   Relevant  Medications   buPROPion (WELLBUTRIN SR) 150 MG 12 hr tablet   escitalopram (LEXAPRO) 10 MG tablet      Follow-up: Return in about 3 months (around 11/10/2019), or if symptoms worsen or fail to improve, for hypertension.  Ann Held, DO

## 2019-08-17 ENCOUNTER — Other Ambulatory Visit: Payer: Self-pay

## 2019-08-17 ENCOUNTER — Encounter (INDEPENDENT_AMBULATORY_CARE_PROVIDER_SITE_OTHER): Payer: Self-pay | Admitting: Family Medicine

## 2019-08-17 ENCOUNTER — Ambulatory Visit (INDEPENDENT_AMBULATORY_CARE_PROVIDER_SITE_OTHER): Payer: BC Managed Care – PPO | Admitting: Family Medicine

## 2019-08-17 VITALS — BP 138/89 | HR 87 | Temp 98.4°F | Ht 65.0 in | Wt 234.0 lb

## 2019-08-17 DIAGNOSIS — E559 Vitamin D deficiency, unspecified: Secondary | ICD-10-CM | POA: Diagnosis not present

## 2019-08-17 DIAGNOSIS — Z9189 Other specified personal risk factors, not elsewhere classified: Secondary | ICD-10-CM | POA: Diagnosis not present

## 2019-08-17 DIAGNOSIS — Z6838 Body mass index (BMI) 38.0-38.9, adult: Secondary | ICD-10-CM

## 2019-08-17 DIAGNOSIS — R7303 Prediabetes: Secondary | ICD-10-CM

## 2019-08-17 MED ORDER — METFORMIN HCL 500 MG PO TABS: 500.0000 mg | ORAL_TABLET | Freq: Every day | ORAL | 0 refills | Status: DC

## 2019-08-17 MED ORDER — VITAMIN D (ERGOCALCIFEROL) 1.25 MG (50000 UNIT) PO CAPS: 50000.0000 [IU] | ORAL_CAPSULE | ORAL | 0 refills | Status: DC

## 2019-08-21 NOTE — Progress Notes (Signed)
Chief Complaint:   OBESITY [name] is here to discuss her progress with her obesity treatment plan along with follow-up of her obesity related diagnoses. [name] is on keeping a food journal and adhering to recommended goals of 1500-1800 calories and 100 grams of protein daily and states she is following her eating plan approximately 90% of the time. [name] states she is bike riding, weights and on the treadmill for 60 minutes 1 time per week.  Today's visit was #: 2 Starting weight: 240 lbs Starting date: 08/03/2019 Today's weight: 234 lbs Today's date: 08/17/2019 Total lbs lost to date: 6 Total lbs lost since last in-office visit: 6  Interim History: [name] did well with journaling. Her hunger was mostly controlled. She struggled to meet her protein goal.  Subjective:   1. Pre-diabetes [name]'s A1c is elevated at 6.0. She notes decreased polyphagia on her diet prescription. I discussed labs with the patient today.  2. Vitamin D deficiency [name]'s Vit D level is low, and she notes fatigue. She is not on Vit D supplementation. I discussed labs with the patient today.  3. At risk for diabetes mellitus [name] is at higher than average risk for developing diabetes due to her obesity.   Assessment/Plan:   1. Pre-diabetes [name] will continue to work on weight loss, exercise, and decreasing simple carbohydrates to help decrease the risk of diabetes. [name] agreed to start metformin 500 mg q daily with no refills (she is ok to start at 1/2 pill with food).  - metFORMIN (GLUCOPHAGE) 500 MG tablet; Take 1 tablet (500 mg total) by mouth daily with breakfast.  Dispense: 30 tablet; Refill: 0  2. Vitamin D deficiency Low Vitamin D level contributes to fatigue and are associated with obesity, breast, and colon cancer. [name] agreed to start prescription Vitamin D 50,000 IU every week with no refills. She will follow-up for routine testing of Vitamin D, at least 2-3 times per year to avoid  over-replacement.  - Vitamin D, Ergocalciferol, (DRISDOL) 1.25 MG (50000 UNIT) CAPS capsule; Take 1 capsule (50,000 Units total) by mouth every 7 (seven) days.  Dispense: 4 capsule; Refill: 0  3. At risk for diabetes mellitus [name] was given approximately 30 minutes of diabetes education and counseling today. We discussed intensive lifestyle modifications today with an emphasis on weight loss as well as increasing exercise and decreasing simple carbohydrates in her diet. We also reviewed medication options with an emphasis on risk versus benefit of those discussed.   Repetitive spaced learning was employed today to elicit superior memory formation and behavioral change.  4. Class 2 severe obesity with serious comorbidity and body mass index (BMI) of 38.0 to 38.9 in adult, unspecified obesity type (HCC) [name] is currently in the action stage of change. As such, her goal is to continue with weight loss efforts. She has agreed to keeping a food journal and adhering to recommended goals of 1500-1800 calories and 100+ grams of protein daily.   Higher protein recipes were given today.  Exercise goals: As is.  Behavioral modification strategies: increasing lean protein intake, meal planning and cooking strategies and better snacking choices.  [name] has agreed to follow-up with our clinic in 2 weeks. She was informed of the importance of frequent follow-up visits to maximize her success with intensive lifestyle modifications for her multiple health conditions.   Objective:   Blood pressure 138/89, pulse 87, temperature 98.4 F (36.9 C), temperature source Oral, height 5\' 5"  (1.651 m), weight 234 lb (106.1  kg), last menstrual period 08/17/2014, SpO2 98 %. Body mass index is 38.94 kg/m.  General: Cooperative, alert, well developed, in no acute distress. HEENT: Conjunctivae and lids unremarkable. Cardiovascular: Regular rhythm.  Lungs: Normal work of breathing. Neurologic: No focal deficits.    Lab Results  Component Value Date   CREATININE 0.63 08/03/2019   BUN 10 08/03/2019   NA 140 08/03/2019   K 4.5 08/03/2019   CL 103 08/03/2019   CO2 23 08/03/2019   Lab Results  Component Value Date   ALT 15 08/03/2019   AST 12 08/03/2019   ALKPHOS 65 08/03/2019   BILITOT 0.2 08/03/2019   Lab Results  Component Value Date   HGBA1C 6.0 (H) 08/03/2019   HGBA1C 6.1 08/13/2017   HGBA1C 5.8 (H) 11/05/2016   Lab Results  Component Value Date   INSULIN 19.2 08/03/2019   INSULIN 12.9 11/05/2016   Lab Results  Component Value Date   TSH 2.19 07/14/2019   Lab Results  Component Value Date   CHOL 151 07/14/2019   HDL 39.30 07/14/2019   LDLCALC 93 07/14/2019   TRIG 94.0 07/14/2019   CHOLHDL 4 07/14/2019   Lab Results  Component Value Date   WBC 9.2 07/14/2019   HGB 13.2 07/14/2019   HCT 39.3 07/14/2019   MCV 93.7 07/14/2019   PLT 399.0 07/14/2019   No results found for: IRON, TIBC, FERRITIN  Attestation Statements:   Reviewed by clinician on day of visit: allergies, medications, problem list, medical history, surgical history, family history, social history, and previous encounter notes.   I, Trixie Dredge, am acting as transcriptionist for Dennard Nip, MD.  I have reviewed the above documentation for accuracy and completeness, and I agree with the above. -  Dennard Nip, MD

## 2019-09-05 ENCOUNTER — Other Ambulatory Visit: Payer: Self-pay

## 2019-09-05 ENCOUNTER — Encounter (INDEPENDENT_AMBULATORY_CARE_PROVIDER_SITE_OTHER): Payer: Self-pay | Admitting: Family Medicine

## 2019-09-05 ENCOUNTER — Ambulatory Visit (INDEPENDENT_AMBULATORY_CARE_PROVIDER_SITE_OTHER): Payer: BC Managed Care – PPO | Admitting: Family Medicine

## 2019-09-05 VITALS — BP 118/76 | HR 84 | Temp 98.2°F | Ht 65.0 in | Wt 229.0 lb

## 2019-09-05 DIAGNOSIS — R7303 Prediabetes: Secondary | ICD-10-CM | POA: Diagnosis not present

## 2019-09-05 DIAGNOSIS — Z9189 Other specified personal risk factors, not elsewhere classified: Secondary | ICD-10-CM | POA: Diagnosis not present

## 2019-09-05 DIAGNOSIS — E559 Vitamin D deficiency, unspecified: Secondary | ICD-10-CM | POA: Diagnosis not present

## 2019-09-05 DIAGNOSIS — F3289 Other specified depressive episodes: Secondary | ICD-10-CM

## 2019-09-05 DIAGNOSIS — Z6838 Body mass index (BMI) 38.0-38.9, adult: Secondary | ICD-10-CM

## 2019-09-05 MED ORDER — VITAMIN D (ERGOCALCIFEROL) 1.25 MG (50000 UNIT) PO CAPS: 50000.0000 [IU] | ORAL_CAPSULE | ORAL | 0 refills | Status: DC

## 2019-09-05 MED ORDER — BUPROPION HCL ER (SR) 150 MG PO TB12: 150.0000 mg | ORAL_TABLET | Freq: Every day | ORAL | 0 refills | Status: DC

## 2019-09-06 NOTE — Progress Notes (Signed)
Chief Complaint:   OBESITY [name] is here to discuss her progress with her obesity treatment plan along with follow-up of her obesity related diagnoses. [name] is on keeping a food journal and adhering to recommended goals of 1500-1800 calories and 100+ grams of protein daily and states she is following her eating plan approximately 85-90% of the time. [name] states she is doing 0 minutes 0 times per week.  Today's visit was #: 3 Starting weight: 240 lbs Starting date: 08/03/2019 Today's weight: 229 lbs Today's date: 09/05/2019 Total lbs lost to date: 11 Total lbs lost since last in-office visit: 5  Interim History: [name] continues to do well with weight loss with journaling. She is doing well meeting her protein goal and her hunger is controlled.  Subjective:   1. Pre-diabetes [name] started metformin and she had significant GI upset. She would like to discontinue this medication.  2. Vitamin D deficiency [name] is stable on Vit D, and but level is not yet at goal.  3. 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  4. At risk for diabetes mellitus [name] is at higher than average risk for developing diabetes due to her obesity.   Assessment/Plan:   1. Pre-diabetes [name] will discontinue metformin, and will continue to work on weight loss, diet, exercise, and decreasing simple carbohydrates to help decrease the risk of diabetes. We will recheck labs in 2 months.  2. Vitamin D deficiency Low Vitamin D level contributes to fatigue and are associated with obesity, breast, and colon cancer. We will refill prescription Vitamin D for 1 month. [name] will follow-up for routine testing of Vitamin D, at least 2-3 times per year to avoid over-replacement.  - Vitamin D, Ergocalciferol, (DRISDOL) 1.25 MG (50000 UNIT) CAPS capsule; Take 1 capsule (50,000 Units total) by mouth every 7 (seven)  days.  Dispense: 4 capsule; Refill: 0  3. Other depression with emotional eating Behavior modification techniques were discussed today to help [name] deal with her emotional/non-hunger eating behaviors. [name] agreed to start Wellbutrin SR 150 mg q daily with no refills. Orders and follow up as documented in patient record.   - buPROPion (WELLBUTRIN SR) 150 MG 12 hr tablet; Take 1 tablet (150 mg total) by mouth daily.  Dispense: 30 tablet; Refill: 0  4. At risk for diabetes mellitus [name] was given approximately 15 minutes of diabetes education and counseling today. We discussed intensive lifestyle modifications today with an emphasis on weight loss as well as increasing exercise and decreasing simple carbohydrates in her diet. We also reviewed medication options with an emphasis on risk versus benefit of those discussed.   Repetitive spaced learning was employed today to elicit superior memory formation and behavioral change.  5. Class 2 severe obesity with serious comorbidity and body mass index (BMI) of 38.0 to 38.9 in adult, unspecified obesity type (HCC) [name] is currently in the action stage of change. As such, her goal is to continue with weight loss efforts. She has agreed to keeping a food journal and adhering to recommended goals of 1500-1800 calories and 100+ grams of protein daily.   Behavioral modification strategies: increasing lean protein intake and meal planning and cooking strategies.  [name] has agreed to follow-up with our clinic in 3 weeks. She was informed of the importance of frequent follow-up visits to maximize her success with intensive lifestyle modifications for her multiple health conditions.  Objective:   Blood pressure 118/76, pulse 84, temperature 98.2 F (36.8 C), temperature source Oral, height 5\' 5"  (1.651 m), weight 229 lb (103.9 kg), last menstrual period 08/17/2014, SpO2 98 %. Body mass index is 38.11 kg/m.  General: Cooperative, alert, well developed, in  no acute distress. HEENT: Conjunctivae and lids unremarkable. Cardiovascular: Regular rhythm.  Lungs: Normal work of breathing. Neurologic: No focal deficits.   Lab Results  Component Value Date   CREATININE 0.63 08/03/2019   BUN 10 08/03/2019   NA 140 08/03/2019   K 4.5 08/03/2019   CL 103 08/03/2019   CO2 23 08/03/2019   Lab Results  Component Value Date   ALT 15 08/03/2019   AST 12 08/03/2019   ALKPHOS 65 08/03/2019   BILITOT 0.2 08/03/2019   Lab Results  Component Value Date   HGBA1C 6.0 (H) 08/03/2019   HGBA1C 6.1 08/13/2017   HGBA1C 5.8 (H) 11/05/2016   Lab Results  Component Value Date   INSULIN 19.2 08/03/2019   INSULIN 12.9 11/05/2016   Lab Results  Component Value Date   TSH 2.19 07/14/2019   Lab Results  Component Value Date   CHOL 151 07/14/2019   HDL 39.30 07/14/2019   LDLCALC 93 07/14/2019   TRIG 94.0 07/14/2019   CHOLHDL 4 07/14/2019   Lab Results  Component Value Date   WBC 9.2 07/14/2019   HGB 13.2 07/14/2019   HCT 39.3 07/14/2019   MCV 93.7 07/14/2019   PLT 399.0 07/14/2019   No results found for: IRON, TIBC, FERRITIN  Attestation Statements:   Reviewed by clinician on day of visit: allergies, medications, problem list, medical history, surgical history, family history, social history, and previous encounter notes.   I, Trixie Dredge, am acting as transcriptionist for Dennard Nip, MD.  I have reviewed the above documentation for accuracy and completeness, and I agree with the above. -  Dennard Nip, MD

## 2019-09-08 ENCOUNTER — Telehealth: Payer: Self-pay | Admitting: Family Medicine

## 2019-09-08 ENCOUNTER — Encounter: Payer: Self-pay | Admitting: Family Medicine

## 2019-09-08 NOTE — Telephone Encounter (Signed)
Patient requesting a call back from [address].

## 2019-09-11 MED FILL — BYSTOLIC 5 MG TABLET: 5 | 30 days supply | Qty: 30 | Fill #1

## 2019-09-12 ENCOUNTER — Other Ambulatory Visit: Payer: Self-pay

## 2019-09-12 ENCOUNTER — Other Ambulatory Visit: Payer: Self-pay | Admitting: Family Medicine

## 2019-09-12 DIAGNOSIS — N760 Acute vaginitis: Secondary | ICD-10-CM

## 2019-09-12 DIAGNOSIS — I10 Essential (primary) hypertension: Secondary | ICD-10-CM

## 2019-09-12 MED ORDER — NEBIVOLOL HCL 5 MG PO TABS: 5.0000 mg | ORAL_TABLET | Freq: Every day | ORAL | 1 refills | Status: DC

## 2019-09-12 MED ORDER — FLUCONAZOLE 150 MG PO TABS: ORAL_TABLET | ORAL | 0 refills | Status: DC

## 2019-09-12 NOTE — Telephone Encounter (Signed)
Diflucan already sent in

## 2019-09-12 NOTE — Telephone Encounter (Signed)
See mychart message, awaiting PCP response.

## 2019-09-12 NOTE — Telephone Encounter (Signed)
Called patient, spoke to her. It looks like 08/03/2019 a CMA at Dr. Arman Filter office discontinued the medication. I believe this was a mistake as there is no documentation as to why it was or should have been discontinued. Patient is still taking this. Refill has been sent to pharmacy.   She also sent mychart message:  I need a prescription for a yeast infection. Went to the dentist and had to take antibiotics before appointment.  This normally causes me to develop a yeast infection. Over the counter meds normally don't work. I tried that method first.   Please advise.

## 2019-09-12 NOTE — Telephone Encounter (Signed)
I was going to refill Bystolic however I notice it is no longer on med list. Please advise. Also, Pt is requesting medication for yeast infection- she had to take abx from dentist- she has also tried OTC.

## 2019-09-12 NOTE — Telephone Encounter (Signed)
Please ask pt about med ---since its not on her med list

## 2019-09-26 ENCOUNTER — Encounter (INDEPENDENT_AMBULATORY_CARE_PROVIDER_SITE_OTHER): Payer: Self-pay | Admitting: Family Medicine

## 2019-09-26 ENCOUNTER — Other Ambulatory Visit: Payer: Self-pay

## 2019-09-26 ENCOUNTER — Ambulatory Visit (INDEPENDENT_AMBULATORY_CARE_PROVIDER_SITE_OTHER): Payer: BC Managed Care – PPO | Admitting: Family Medicine

## 2019-09-26 VITALS — BP 139/84 | HR 74 | Temp 99.0°F | Ht 65.0 in | Wt 230.0 lb

## 2019-09-26 DIAGNOSIS — K219 Gastro-esophageal reflux disease without esophagitis: Secondary | ICD-10-CM

## 2019-09-26 DIAGNOSIS — Z6838 Body mass index (BMI) 38.0-38.9, adult: Secondary | ICD-10-CM | POA: Diagnosis not present

## 2019-09-27 NOTE — Progress Notes (Signed)
Chief Complaint:   OBESITY [name] is here to discuss her progress with her obesity treatment plan along with follow-up of her obesity related diagnoses. [name] is on keeping a food journal and adhering to recommended goals of 1500-1800 calories and 100+ grams of protein daily and states she is following her eating plan approximately 75% of the time. [name] states she is doing 0 minutes 0 times per week.  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 She notes work is stressful and she has no planning time now which is more stressful.  Subjective:   1. Gastroesophageal reflux disease  [name] started OTC Prilosec with increased GERD symptoms, and she feels it isn't working as well as the prescription.  Assessment/Plan:   1. Gastroesophageal reflux disease  Intensive lifestyle modifications are the first line treatment for this issue. We discussed several lifestyle modifications today. [name] agreed to increase Prilosec OTC to BID and will follow up as directed. [name] will continue to work on diet, exercise and weight loss efforts. Orders and follow up as documented in patient record.   2. Class 2 severe obesity with serious comorbidity and body mass index (BMI) of 38.0 to 38.9 in adult, unspecified obesity type (HCC) [name] is currently in the action stage of change. As such, her goal is to continue with weight loss efforts. She has agreed to the Category 3 Plan or keeping a food journal and adhering to recommended goals of 1500-1800 calories and 100+ grams of protein daily.   Behavioral modification strategies: increasing lean protein intake and celebration eating strategies.  [name] has agreed to follow-up with our clinic in 2 to 3 weeks. She was  informed of the importance of frequent follow-up visits to maximize her success with intensive lifestyle modifications for her multiple health conditions.   Objective:   Blood pressure 139/84, pulse 74, temperature 99 F (37.2 C), height 5\' 5"  (1.651 m), weight 230 lb (104.3 kg), last menstrual period 08/17/2014, SpO2 99 %. Body mass index is 38.27 kg/m.  General: Cooperative, alert, well developed, in no acute distress. HEENT: Conjunctivae and lids unremarkable. Cardiovascular: Regular rhythm.  Lungs: Normal work of breathing. Neurologic: No focal deficits.   Lab Results  Component Value Date   CREATININE 0.63 08/03/2019   BUN 10 08/03/2019   NA 140 08/03/2019   K 4.5 08/03/2019   CL 103 08/03/2019   CO2 23 08/03/2019   Lab Results  Component Value Date   ALT 15 08/03/2019   AST 12 08/03/2019   ALKPHOS 65 08/03/2019   BILITOT 0.2 08/03/2019   Lab Results  Component Value Date   HGBA1C 6.0 (H) 08/03/2019   HGBA1C 6.1 08/13/2017   HGBA1C 5.8 (H) 11/05/2016   Lab Results  Component Value Date   INSULIN 19.2 08/03/2019   INSULIN 12.9 11/05/2016   Lab Results  Component Value Date   TSH 2.19 07/14/2019   Lab Results  Component Value Date   CHOL 151 07/14/2019   HDL 39.30 07/14/2019   LDLCALC 93 07/14/2019   TRIG 94.0 07/14/2019   CHOLHDL 4 07/14/2019   Lab Results  Component Value Date   WBC 9.2 07/14/2019   HGB 13.2 07/14/2019   HCT 39.3 07/14/2019   MCV 93.7  07/14/2019   PLT 399.0 07/14/2019   No results found for: IRON, TIBC, FERRITIN  Attestation Statements:   Reviewed by clinician on day of visit: allergies, medications, problem list, medical history, surgical history, family history, social history, and previous encounter notes.  Time spent on visit including pre-visit chart review and post-visit care and charting was 32 minutes.    I, Trixie Dredge, am acting as transcriptionist for Dennard Nip, MD.  I have reviewed the above documentation  for accuracy and completeness, and I agree with the above. -  Dennard Nip, MD

## 2019-10-08 ENCOUNTER — Other Ambulatory Visit (INDEPENDENT_AMBULATORY_CARE_PROVIDER_SITE_OTHER): Payer: Self-pay | Admitting: Family Medicine

## 2019-10-08 DIAGNOSIS — E559 Vitamin D deficiency, unspecified: Secondary | ICD-10-CM

## 2019-10-09 NOTE — Telephone Encounter (Signed)
Refill protocol given to Dr Leafy Ro

## 2019-10-12 MED FILL — AMLODIPINE BESYLATE 10 MG T: 10 | 90 days supply | Qty: 90 | Fill #1

## 2019-10-12 MED FILL — ESCITALOPRAM 10 MG TABLET: 10 | 90 days supply | Qty: 90 | Fill #1

## 2019-10-12 MED FILL — FUROSEMIDE 20 MG TAB: 20 | 90 days supply | Qty: 90 | Fill #1

## 2019-10-12 MED FILL — BUPROPION HCL ER (SR) 150 M: 150 | 90 days supply | Qty: 90 | Fill #1

## 2019-10-12 MED FILL — BYSTOLIC 5 MG TABLET: 5 | 90 days supply | Qty: 90 | Fill #0

## 2019-10-12 MED FILL — POTASSIUM CL ER 10 MEQ TAB: 10 | 90 days supply | Qty: 90 | Fill #1

## 2019-10-14 ENCOUNTER — Other Ambulatory Visit: Payer: Self-pay | Admitting: Family Medicine

## 2019-10-14 DIAGNOSIS — N760 Acute vaginitis: Secondary | ICD-10-CM

## 2019-10-17 ENCOUNTER — Encounter (INDEPENDENT_AMBULATORY_CARE_PROVIDER_SITE_OTHER): Payer: Self-pay | Admitting: Family Medicine

## 2019-10-17 ENCOUNTER — Ambulatory Visit (INDEPENDENT_AMBULATORY_CARE_PROVIDER_SITE_OTHER): Payer: BC Managed Care – PPO | Admitting: Family Medicine

## 2019-10-17 ENCOUNTER — Other Ambulatory Visit: Payer: Self-pay

## 2019-10-17 VITALS — BP 118/74 | HR 79 | Temp 98.5°F | Ht 65.0 in | Wt 225.0 lb

## 2019-10-17 DIAGNOSIS — Z6837 Body mass index (BMI) 37.0-37.9, adult: Secondary | ICD-10-CM | POA: Diagnosis not present

## 2019-10-17 DIAGNOSIS — F3289 Other specified depressive episodes: Secondary | ICD-10-CM

## 2019-10-17 MED ORDER — BUPROPION HCL ER (SR) 200 MG PO TB12: 200.0000 mg | ORAL_TABLET | Freq: Every day | ORAL | 0 refills | Status: DC

## 2019-10-19 NOTE — Progress Notes (Signed)
Chief Complaint:   OBESITY [name] is here to discuss her progress with her obesity treatment plan along with follow-up of her obesity related diagnoses. [name] is on the Category 3 Plan or keeping a food journal and adhering to recommended goals of 1500-1800 calories and 100+ grams of protein daily and states she is following her eating plan approximately 85-90% of the time. [name] states she is doing 0 minutes 0 times per week.  Today's visit was #: 5 Starting weight: 240 lbs Starting date: 08/03/2019 Today's weight: 225 lbs Today's date: 10/17/2019 Total lbs lost to date: 15 Total lbs lost since last in-office visit: 5  Interim History: [name] continues to do well with weight loss, and she is journaling regularly. She is meeting her protein goal regularly. She notes increased stress at home and at work.  Subjective:   1. Other depression with emotional eating [name] notes increased stress and increased stress eating, especially at work. She is feeling overwhelmed at times.  Assessment/Plan:   1. Other depression with emotional eating Behavior modification techniques were discussed today to help [name] deal with her emotional/non-hunger eating behaviors. [name] agreed to increase Wellbutrin SR to 200 mg q AM with no refills, and she will continue Lexapro as is. Orders and follow up as documented in patient record.   - buPROPion (WELLBUTRIN SR) 200 MG 12 hr tablet; Take 1 tablet (200 mg total) by mouth daily.  Dispense: 30 tablet; Refill: 0  2. Class 2 severe obesity with serious comorbidity and body mass index (BMI) of 37.0 to 37.9 in adult, unspecified obesity type (HCC) [name] is currently in the action stage of change. As such, her goal is to continue with weight loss efforts. She has agreed to keeping a food journal and adhering to recommended goals of 1500-1800 calories and 100+ grams of protein daily.   Behavioral modification strategies: increasing lean protein intake.  [name] has  agreed to follow-up with our clinic in 3 weeks. She was informed of the importance of frequent follow-up visits to maximize her success with intensive lifestyle modifications for her multiple health conditions.   Objective:   Blood pressure 118/74, pulse 79, temperature 98.5 F (36.9 C), height 5\' 5"  (1.651 m), weight 225 lb (102.1 kg), last menstrual period 08/17/2014, SpO2 99 %. Body mass index is 37.44 kg/m.  General: Cooperative, alert, well developed, in no acute distress. HEENT: Conjunctivae and lids unremarkable. Cardiovascular: Regular rhythm.  Lungs: Normal work of breathing. Neurologic: No focal deficits.   Lab Results  Component Value Date   CREATININE 0.63 08/03/2019   BUN 10 08/03/2019   NA 140 08/03/2019   K 4.5 08/03/2019   CL 103 08/03/2019   CO2 23 08/03/2019   Lab Results  Component Value Date   ALT 15 08/03/2019   AST 12 08/03/2019   ALKPHOS 65 08/03/2019   BILITOT 0.2 08/03/2019   Lab Results  Component Value Date   HGBA1C 6.0 (H) 08/03/2019   HGBA1C 6.1 08/13/2017   HGBA1C 5.8 (H) 11/05/2016   Lab Results  Component Value Date   INSULIN 19.2 08/03/2019   INSULIN 12.9 11/05/2016   Lab Results  Component Value Date   TSH 2.19 07/14/2019   Lab Results  Component Value Date   CHOL 151 07/14/2019   HDL 39.30 07/14/2019   LDLCALC 93 07/14/2019   TRIG 94.0 07/14/2019   CHOLHDL 4 07/14/2019   Lab Results  Component Value Date   WBC 9.2 07/14/2019   HGB  13.2 07/14/2019   HCT 39.3 07/14/2019   MCV 93.7 07/14/2019   PLT 399.0 07/14/2019   No results found for: IRON, TIBC, FERRITIN  Attestation Statements:   Reviewed by clinician on day of visit: allergies, medications, problem list, medical history, surgical history, family history, social history, and previous encounter notes.  Time spent on visit including pre-visit chart review and post-visit care and charting was 30 minutes.    I, Trixie Dredge, am acting as transcriptionist for  Dennard Nip, MD.  I have reviewed the above documentation for accuracy and completeness, and I agree with the above. -  Dennard Nip, MD

## 2019-11-07 ENCOUNTER — Encounter (INDEPENDENT_AMBULATORY_CARE_PROVIDER_SITE_OTHER): Payer: Self-pay | Admitting: Family Medicine

## 2019-11-07 ENCOUNTER — Ambulatory Visit (INDEPENDENT_AMBULATORY_CARE_PROVIDER_SITE_OTHER): Payer: BC Managed Care – PPO | Admitting: Family Medicine

## 2019-11-07 ENCOUNTER — Other Ambulatory Visit: Payer: Self-pay

## 2019-11-07 VITALS — BP 130/81 | HR 74 | Temp 98.2°F | Ht 65.0 in | Wt 223.0 lb

## 2019-11-07 DIAGNOSIS — Z9189 Other specified personal risk factors, not elsewhere classified: Secondary | ICD-10-CM

## 2019-11-07 DIAGNOSIS — E559 Vitamin D deficiency, unspecified: Secondary | ICD-10-CM

## 2019-11-07 DIAGNOSIS — F3289 Other specified depressive episodes: Secondary | ICD-10-CM

## 2019-11-07 DIAGNOSIS — Z6837 Body mass index (BMI) 37.0-37.9, adult: Secondary | ICD-10-CM | POA: Diagnosis not present

## 2019-11-07 MED ORDER — VITAMIN D (ERGOCALCIFEROL) 1.25 MG (50000 UNIT) PO CAPS: ORAL_CAPSULE | ORAL | 0 refills | Status: DC

## 2019-11-07 MED ORDER — BUPROPION HCL ER (SR) 200 MG PO TB12: 200.0000 mg | ORAL_TABLET | Freq: Every day | ORAL | 0 refills | Status: DC

## 2019-11-10 ENCOUNTER — Encounter: Payer: Self-pay | Admitting: Family Medicine

## 2019-11-10 ENCOUNTER — Other Ambulatory Visit: Payer: Self-pay

## 2019-11-10 ENCOUNTER — Ambulatory Visit: Payer: BC Managed Care – PPO | Admitting: Family Medicine

## 2019-11-10 VITALS — BP 137/100 | HR 85 | Temp 98.1°F | Resp 18 | Ht 65.0 in | Wt 228.4 lb

## 2019-11-10 DIAGNOSIS — I10 Essential (primary) hypertension: Secondary | ICD-10-CM

## 2019-11-10 DIAGNOSIS — Z79899 Other long term (current) drug therapy: Secondary | ICD-10-CM | POA: Diagnosis not present

## 2019-11-10 DIAGNOSIS — M8949 Other hypertrophic osteoarthropathy, multiple sites: Secondary | ICD-10-CM | POA: Diagnosis not present

## 2019-11-10 DIAGNOSIS — Z6835 Body mass index (BMI) 35.0-35.9, adult: Secondary | ICD-10-CM

## 2019-11-10 DIAGNOSIS — M159 Polyosteoarthritis, unspecified: Secondary | ICD-10-CM

## 2019-11-10 MED ORDER — TRAMADOL HCL 50 MG PO TABS: 50.0000 mg | ORAL_TABLET | Freq: Four times a day (QID) | ORAL | 0 refills | Status: DC | PRN
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
## 2019-11-10 NOTE — Assessment & Plan Note (Signed)
con't with healthy weight and wellness

## 2019-11-10 NOTE — Patient Instructions (Signed)
DASH Eating Plan DASH stands for "Dietary Approaches to Stop Hypertension." The DASH eating plan is a healthy eating plan that has been shown to reduce high blood pressure (hypertension). It may also reduce your risk for type 2 diabetes, heart disease, and stroke. The DASH eating plan may also help with weight loss. What are tips for following this plan?  General guidelines  Avoid eating more than 2,300 mg (milligrams) of salt (sodium) a day. If you have hypertension, you may need to reduce your sodium intake to 1,500 mg a day.  Limit alcohol intake to no more than 1 drink a day for nonpregnant women and 2 drinks a day for men. One drink equals 12 oz of beer, 5 oz of wine, or 1 oz of hard liquor.  Work with your health care provider to maintain a healthy body weight or to lose weight. Ask what an ideal weight is for you.  Get at least 30 minutes of exercise that causes your heart to beat faster (aerobic exercise) most days of the week. Activities may include walking, swimming, or biking.  Work with your health care provider or diet and nutrition specialist (dietitian) to adjust your eating plan to your individual calorie needs. Reading food labels   Check food labels for the amount of sodium per serving. Choose foods with less than 5 percent of the Daily Value of sodium. Generally, foods with less than 300 mg of sodium per serving fit into this eating plan.  To find whole grains, look for the word "whole" as the first word in the ingredient list. Shopping  Buy products labeled as "low-sodium" or "no salt added."  Buy fresh foods. Avoid canned foods and premade or frozen meals. Cooking  Avoid adding salt when cooking. Use salt-free seasonings or herbs instead of table salt or sea salt. Check with your health care provider or pharmacist before using salt substitutes.  Do not fry foods. Cook foods using healthy methods such as baking, boiling, grilling, and broiling instead.  Cook with  heart-healthy oils, such as olive, canola, soybean, or sunflower oil. Meal planning  Eat a balanced diet that includes: ? 5 or more servings of fruits and vegetables each day. At each meal, try to fill half of your plate with fruits and vegetables. ? Up to 6-8 servings of whole grains each day. ? Less than 6 oz of lean meat, poultry, or fish each day. A 3-oz serving of meat is about the same size as a deck of cards. One egg equals 1 oz. ? 2 servings of low-fat dairy each day. ? A serving of nuts, seeds, or beans 5 times each week. ? Heart-healthy fats. Healthy fats called Omega-3 fatty acids are found in foods such as flaxseeds and coldwater fish, like sardines, salmon, and mackerel.  Limit how much you eat of the following: ? Canned or prepackaged foods. ? Food that is high in trans fat, such as fried foods. ? Food that is high in saturated fat, such as fatty meat. ? Sweets, desserts, sugary drinks, and other foods with added sugar. ? Full-fat dairy products.  Do not salt foods before eating.  Try to eat at least 2 vegetarian meals each week.  Eat more home-cooked food and less restaurant, buffet, and fast food.  When eating at a restaurant, ask that your food be prepared with less salt or no salt, if possible. What foods are recommended? The items listed may not be a complete list. Talk with your dietitian about   what dietary choices are best for you. Grains Whole-grain or whole-wheat bread. Whole-grain or whole-wheat pasta. Brown rice. Oatmeal. Quinoa. Bulgur. Whole-grain and low-sodium cereals. Pita bread. Low-fat, low-sodium crackers. Whole-wheat flour tortillas. Vegetables Fresh or frozen vegetables (raw, steamed, roasted, or grilled). Low-sodium or reduced-sodium tomato and vegetable juice. Low-sodium or reduced-sodium tomato sauce and tomato paste. Low-sodium or reduced-sodium canned vegetables. Fruits All fresh, dried, or frozen fruit. Canned fruit in natural juice (without  added sugar). Meat and other protein foods Skinless chicken or turkey. Ground chicken or turkey. Pork with fat trimmed off. Fish and seafood. Egg whites. Dried beans, peas, or lentils. Unsalted nuts, nut butters, and seeds. Unsalted canned beans. Lean cuts of beef with fat trimmed off. Low-sodium, lean deli meat. Dairy Low-fat (1%) or fat-free (skim) milk. Fat-free, low-fat, or reduced-fat cheeses. Nonfat, low-sodium ricotta or cottage cheese. Low-fat or nonfat yogurt. Low-fat, low-sodium cheese. Fats and oils Soft margarine without trans fats. Vegetable oil. Low-fat, reduced-fat, or light mayonnaise and salad dressings (reduced-sodium). Canola, safflower, olive, soybean, and sunflower oils. Avocado. Seasoning and other foods Herbs. Spices. Seasoning mixes without salt. Unsalted popcorn and pretzels. Fat-free sweets. What foods are not recommended? The items listed may not be a complete list. Talk with your dietitian about what dietary choices are best for you. Grains Baked goods made with fat, such as croissants, muffins, or some breads. Dry pasta or rice meal packs. Vegetables Creamed or fried vegetables. Vegetables in a cheese sauce. Regular canned vegetables (not low-sodium or reduced-sodium). Regular canned tomato sauce and paste (not low-sodium or reduced-sodium). Regular tomato and vegetable juice (not low-sodium or reduced-sodium). Pickles. Olives. Fruits Canned fruit in a light or heavy syrup. Fried fruit. Fruit in cream or butter sauce. Meat and other protein foods Fatty cuts of meat. Ribs. Fried meat. Bacon. Sausage. Bologna and other processed lunch meats. Salami. Fatback. Hotdogs. Bratwurst. Salted nuts and seeds. Canned beans with added salt. Canned or smoked fish. Whole eggs or egg yolks. Chicken or turkey with skin. Dairy Whole or 2% milk, cream, and half-and-half. Whole or full-fat cream cheese. Whole-fat or sweetened yogurt. Full-fat cheese. Nondairy creamers. Whipped toppings.  Processed cheese and cheese spreads. Fats and oils Butter. Stick margarine. Lard. Shortening. Ghee. Bacon fat. Tropical oils, such as coconut, palm kernel, or palm oil. Seasoning and other foods Salted popcorn and pretzels. Onion salt, garlic salt, seasoned salt, table salt, and sea salt. Worcestershire sauce. Tartar sauce. Barbecue sauce. Teriyaki sauce. Soy sauce, including reduced-sodium. Steak sauce. Canned and packaged gravies. Fish sauce. Oyster sauce. Cocktail sauce. Horseradish that you find on the shelf. Ketchup. Mustard. Meat flavorings and tenderizers. Bouillon cubes. Hot sauce and Tabasco sauce. Premade or packaged marinades. Premade or packaged taco seasonings. Relishes. Regular salad dressings. Where to find more information:  National Heart, Lung, and Blood Institute: www.nhlbi.nih.gov  American Heart Association: www.heart.org Summary  The DASH eating plan is a healthy eating plan that has been shown to reduce high blood pressure (hypertension). It may also reduce your risk for type 2 diabetes, heart disease, and stroke.  With the DASH eating plan, you should limit salt (sodium) intake to 2,300 mg a day. If you have hypertension, you may need to reduce your sodium intake to 1,500 mg a day.  When on the DASH eating plan, aim to eat more fresh fruits and vegetables, whole grains, lean proteins, low-fat dairy, and heart-healthy fats.  Work with your health care provider or diet and nutrition specialist (dietitian) to adjust your eating plan to your   individual calorie needs. This information is not intended to replace advice given to you by your health care provider. Make sure you discuss any questions you have with your health care provider. Document Revised: 12/25/2016 Document Reviewed: 01/06/2016 Elsevier Patient Education  2020 [employer]  

## 2019-11-10 NOTE — Assessment & Plan Note (Signed)
Well controlled, no changes to meds. Encouraged heart healthy diet such as the DASH diet and exercise as tolerated.  Controlled in other drs offices recently === pt just left a stressful situation at school

## 2019-11-13 ENCOUNTER — Other Ambulatory Visit (INDEPENDENT_AMBULATORY_CARE_PROVIDER_SITE_OTHER): Payer: Self-pay | Admitting: Family Medicine

## 2019-11-13 ENCOUNTER — Other Ambulatory Visit: Payer: Self-pay | Admitting: Family Medicine

## 2019-11-13 DIAGNOSIS — F3289 Other specified depressive episodes: Secondary | ICD-10-CM

## 2019-11-13 DIAGNOSIS — E559 Vitamin D deficiency, unspecified: Secondary | ICD-10-CM

## 2019-11-13 DIAGNOSIS — M8949 Other hypertrophic osteoarthropathy, multiple sites: Secondary | ICD-10-CM

## 2019-11-13 DIAGNOSIS — M159 Polyosteoarthritis, unspecified: Secondary | ICD-10-CM

## 2019-11-14 LAB — DRUG MONITORING, PANEL 8 WITH CONFIRMATION, URINE
6 Acetylmorphine: NEGATIVE ng/mL (ref <10)
Alcohol Metabolites: NEGATIVE ng/mL
Amphetamines: NEGATIVE ng/mL (ref <500)
Benzodiazepines: NEGATIVE ng/mL (ref <100)
Buprenorphine, Urine: NEGATIVE ng/mL (ref <5)
Cocaine Metabolite: NEGATIVE ng/mL (ref <150)
Creatinine: 168.1 mg/dL
MDMA: NEGATIVE ng/mL (ref <500)
Marijuana Metabolite: NEGATIVE ng/mL (ref <20)
Opiates: NEGATIVE ng/mL (ref <100)
Oxidant: NEGATIVE ug/mL
Oxycodone: NEGATIVE ng/mL (ref <100)
pH: 5.5 (ref 4.5–9.0)

## 2019-11-14 LAB — DM TEMPLATE

## 2019-11-14 NOTE — Telephone Encounter (Signed)
*  just needs to be refused. Just sent on 10/15*   Requesting: Tramadol Contract:11/10/2019 UDS: 11/10/19 Last OV: 11/10/19 Next OV: 05/13/20 Last Refill: 11/10/19, #30--0 RF Database:   Please advise

## 2019-11-20 NOTE — Progress Notes (Signed)
Chief Complaint:   OBESITY [name] is here to discuss her progress with her obesity treatment plan along with follow-up of her obesity related diagnoses. [name] is on keeping a food journal and adhering to recommended goals of 1500-1800 calories and 100+ grams of protein daily and states she is following her eating plan approximately 75% of the time. [name] states she is walking for 30 minutes 1 time per week.  Today's visit was #: 6 Starting weight: 240 lbs Starting date: 08/03/2019 Today's weight: 223 lbs Today's date: 11/07/2019 Total lbs lost to date: 17 Total lbs lost since last in-office visit: 2  Interim History: [name] continues to do well with weight loss even with traveling and having to eat out while traveling. She is working on meeting her protein goal.  Subjective:   1. Vitamin D deficiency [name] is stable on Vit D, and she denies nausea or vomiting. She requests a refill today.  2. Other depression [name] is stable on her medications , and she is sleeping ok with melatonin. Her blood pressure is well controlled, and she is doing very well avoiding emotional eating.  3. At risk for heart disease [name] is at a higher than average risk for cardiovascular disease due to obesity.   Assessment/Plan:   1. Vitamin D deficiency Low Vitamin D level contributes to fatigue and are associated with obesity, breast, and colon cancer. We will refill prescription Vitamin D for 1 month. [name] will follow-up for routine testing of Vitamin D, at least 2-3 times per year to avoid over-replacement.  - Vitamin D, Ergocalciferol, (DRISDOL) 1.25 MG (50000 UNIT) CAPS capsule; TAKE 1 CAPSULE BY MOUTH EVERY 7 DAYS  Dispense: 4 capsule; Refill: 0  2. Other depression Behavior modification techniques were discussed today to help [name] deal with her emotional/non-hunger eating behaviors. We will refill Wellbutrin SR for 1 month, and will follow closely. Orders and follow up as documented in patient  record.   - buPROPion (WELLBUTRIN SR) 200 MG 12 hr tablet; Take 1 tablet (200 mg total) by mouth daily.  Dispense: 30 tablet; Refill: 0  3. At risk for heart disease [name] was given approximately 15 minutes of coronary artery disease prevention counseling today. She is 49 y.o. female and has risk factors for heart disease including obesity. We discussed intensive lifestyle modifications today with an emphasis on specific weight loss instructions and strategies.   Repetitive spaced learning was employed today to elicit superior memory formation and behavioral change.  4. Class 2 severe obesity with serious comorbidity and body mass index (BMI) of 37.0 to 37.9 in adult, unspecified obesity type (HCC) [name] is currently in the action stage of change. As such, her goal is to continue with weight loss efforts. She has agreed to keeping a food journal and adhering to recommended goals of 1500-1800 calories and 100+ grams of protein daily.   Exercise goals: As is.  Behavioral modification strategies: increasing lean protein intake and holiday eating strategies .  [name] has agreed to follow-up with our clinic in 3 weeks. She was informed of the importance of frequent follow-up visits to maximize her success with intensive lifestyle modifications for her multiple health conditions.   Objective:   Blood pressure 130/81, pulse 74, temperature 98.2 F (36.8 C), height 5\' 5"  (1.651 m), weight 223 lb (101.2 kg), last menstrual period 08/17/2014, SpO2 100 %. Body mass index is 37.11 kg/m.  General: Cooperative, alert, well developed, in no acute distress. HEENT: Conjunctivae and lids unremarkable.  Cardiovascular: Regular rhythm.  Lungs: Normal work of breathing. Neurologic: No focal deficits.   Lab Results  Component Value Date   CREATININE 0.63 08/03/2019   BUN 10 08/03/2019   NA 140 08/03/2019   K 4.5 08/03/2019   CL 103 08/03/2019   CO2 23 08/03/2019   Lab Results  Component Value Date     ALT 15 08/03/2019   AST 12 08/03/2019   ALKPHOS 65 08/03/2019   BILITOT 0.2 08/03/2019   Lab Results  Component Value Date   HGBA1C 6.0 (H) 08/03/2019   HGBA1C 6.1 08/13/2017   HGBA1C 5.8 (H) 11/05/2016   Lab Results  Component Value Date   INSULIN 19.2 08/03/2019   INSULIN 12.9 11/05/2016   Lab Results  Component Value Date   TSH 2.19 07/14/2019   Lab Results  Component Value Date   CHOL 151 07/14/2019   HDL 39.30 07/14/2019   LDLCALC 93 07/14/2019   TRIG 94.0 07/14/2019   CHOLHDL 4 07/14/2019   Lab Results  Component Value Date   WBC 9.2 07/14/2019   HGB 13.2 07/14/2019   HCT 39.3 07/14/2019   MCV 93.7 07/14/2019   PLT 399.0 07/14/2019   No results found for: IRON, TIBC, FERRITIN  Attestation Statements:   Reviewed by clinician on day of visit: allergies, medications, problem list, medical history, surgical history, family history, social history, and previous encounter notes.   I, Trixie Dredge, am acting as transcriptionist for Dennard Nip, MD.  I have reviewed the above documentation for accuracy and completeness, and I agree with the above. -  Dennard Nip, MD

## 2019-11-28 ENCOUNTER — Ambulatory Visit (INDEPENDENT_AMBULATORY_CARE_PROVIDER_SITE_OTHER): Payer: BC Managed Care – PPO | Admitting: Family Medicine

## 2019-11-28 ENCOUNTER — Other Ambulatory Visit: Payer: Self-pay

## 2019-11-28 ENCOUNTER — Encounter (INDEPENDENT_AMBULATORY_CARE_PROVIDER_SITE_OTHER): Payer: Self-pay | Admitting: Family Medicine

## 2019-11-28 VITALS — BP 127/85 | HR 69 | Temp 98.7°F | Ht 65.0 in | Wt 223.0 lb

## 2019-11-28 DIAGNOSIS — Z9189 Other specified personal risk factors, not elsewhere classified: Secondary | ICD-10-CM

## 2019-11-28 DIAGNOSIS — E559 Vitamin D deficiency, unspecified: Secondary | ICD-10-CM | POA: Diagnosis not present

## 2019-11-28 DIAGNOSIS — F3289 Other specified depressive episodes: Secondary | ICD-10-CM

## 2019-11-28 DIAGNOSIS — Z6837 Body mass index (BMI) 37.0-37.9, adult: Secondary | ICD-10-CM | POA: Diagnosis not present

## 2019-11-29 MED ORDER — VITAMIN D (ERGOCALCIFEROL) 1.25 MG (50000 UNIT) PO CAPS: ORAL_CAPSULE | ORAL | 0 refills | Status: DC

## 2019-11-29 MED ORDER — BUPROPION HCL ER (SR) 200 MG PO TB12: 200.0000 mg | ORAL_TABLET | Freq: Every day | ORAL | 0 refills | Status: DC

## 2019-11-29 NOTE — Progress Notes (Signed)
Chief Complaint:   OBESITY [name] is here to discuss her progress with her obesity treatment plan along with follow-up of her obesity related diagnoses. [name] is on keeping a food journal and adhering to recommended goals of 1500-1800 calories and 100+ grams of protein daily and states she is following her eating plan approximately 75% of the time. [name] states she is doing 0 minutes 0 times per week.  Today's visit was #: 7 Starting weight: 240 lbs Starting date: 08/03/2019 Today's weight: 223 lbs Today's date: 11/28/2019 Total lbs lost to date: 17 Total lbs lost since last in-office visit: 0  Interim History: [name] has done well maintaining her weight. She notes increased cravings especially around noon and especially for simple carbohydrates. She is interested in snack options to help with cravings.  Subjective:   1. Vitamin D deficiency [name] is stable on Vit D, and she denies nausea or vomiting.  2. Other depression with emotional eating [name] is still struggling with cravings especially at work with increased stress. She is tolerating medications well. She denies elevated blood pressure.  3. At risk for impaired metabolic function [name] is at increased risk for impaired metabolic function due to if protein decreasing.  Assessment/Plan:   1. Vitamin D deficiency Low Vitamin D level contributes to fatigue and are associated with obesity, breast, and colon cancer. We will refill prescription Vitamin D for 1 month. [name] will follow-up for routine testing of Vitamin D, at least 2-3 times per year to avoid over-replacement.  - Vitamin D, Ergocalciferol, (DRISDOL) 1.25 MG (50000 UNIT) CAPS capsule; TAKE 1 CAPSULE BY MOUTH EVERY 7 DAYS  Dispense: 4 capsule; Refill: 0  2. Other depression with emotional eating Emotional eating strategies were discussed today to help [name] deal with her emotional/non-hunger eating behaviors. We will refill Wellbutrin SR for 1 month. Orders and  follow up as documented in patient record.   - buPROPion (WELLBUTRIN SR) 200 MG 12 hr tablet; Take 1 tablet (200 mg total) by mouth daily.  Dispense: 30 tablet; Refill: 0  3. At risk for impaired metabolic function [name] was given approximately 15 minutes of impaired  metabolic function prevention counseling today. We discussed intensive lifestyle modifications today with an emphasis on specific nutrition and exercise instructions and strategies.   Repetitive spaced learning was employed today to elicit superior memory formation and behavioral change.  4. Class 2 severe obesity with serious comorbidity and body mass index (BMI) of 37.0 to 37.9 in adult, unspecified obesity type (HCC) [name] is currently in the action stage of change. As such, her goal is to continue with weight loss efforts. She has agreed to keeping a food journal and adhering to recommended goals of 1500-1800 calories and 100+ grams of protein daily.   100-200 calorie snack options handout was given today.  Behavioral modification strategies: emotional eating strategies and holiday eating strategies .  [name] has agreed to follow-up with our clinic in 2 to 3 weeks. She was informed of the importance of frequent follow-up visits to maximize her success with intensive lifestyle modifications for her multiple health conditions.   Objective:   Blood pressure 127/85, pulse 69, temperature 98.7 F (37.1 C), height 5\' 5"  (1.651 m), weight 223 lb (101.2 kg), last menstrual period 08/17/2014, SpO2 100 %. Body mass index is 37.11 kg/m.  General: Cooperative, alert, well developed, in no acute distress. HEENT: Conjunctivae and lids unremarkable. Cardiovascular: Regular rhythm.  Lungs: Normal work of breathing. Neurologic: No focal deficits.  Lab Results  Component Value Date   CREATININE 0.63 08/03/2019   BUN 10 08/03/2019   NA 140 08/03/2019   K 4.5 08/03/2019   CL 103 08/03/2019   CO2 23 08/03/2019   Lab Results    Component Value Date   ALT 15 08/03/2019   AST 12 08/03/2019   ALKPHOS 65 08/03/2019   BILITOT 0.2 08/03/2019   Lab Results  Component Value Date   HGBA1C 6.0 (H) 08/03/2019   HGBA1C 6.1 08/13/2017   HGBA1C 5.8 (H) 11/05/2016   Lab Results  Component Value Date   INSULIN 19.2 08/03/2019   INSULIN 12.9 11/05/2016   Lab Results  Component Value Date   TSH 2.19 07/14/2019   Lab Results  Component Value Date   CHOL 151 07/14/2019   HDL 39.30 07/14/2019   LDLCALC 93 07/14/2019   TRIG 94.0 07/14/2019   CHOLHDL 4 07/14/2019   Lab Results  Component Value Date   WBC 9.2 07/14/2019   HGB 13.2 07/14/2019   HCT 39.3 07/14/2019   MCV 93.7 07/14/2019   PLT 399.0 07/14/2019   No results found for: IRON, TIBC, FERRITIN  Attestation Statements:   Reviewed by clinician on day of visit: allergies, medications, problem list, medical history, surgical history, family history, social history, and previous encounter notes.   I, Trixie Dredge, am acting as transcriptionist for Dennard Nip, MD.  I have reviewed the above documentation for accuracy and completeness, and I agree with the above. -  Dennard Nip, MD

## 2019-12-19 ENCOUNTER — Other Ambulatory Visit: Payer: Self-pay

## 2019-12-19 ENCOUNTER — Encounter (INDEPENDENT_AMBULATORY_CARE_PROVIDER_SITE_OTHER): Payer: Self-pay | Admitting: Family Medicine

## 2019-12-19 ENCOUNTER — Ambulatory Visit (INDEPENDENT_AMBULATORY_CARE_PROVIDER_SITE_OTHER): Payer: BC Managed Care – PPO | Admitting: Family Medicine

## 2019-12-19 VITALS — BP 126/80 | HR 82 | Temp 98.5°F | Ht 65.0 in | Wt 220.0 lb

## 2019-12-19 DIAGNOSIS — Z6836 Body mass index (BMI) 36.0-36.9, adult: Secondary | ICD-10-CM | POA: Diagnosis not present

## 2019-12-19 DIAGNOSIS — Z9189 Other specified personal risk factors, not elsewhere classified: Secondary | ICD-10-CM | POA: Diagnosis not present

## 2019-12-19 DIAGNOSIS — R7303 Prediabetes: Secondary | ICD-10-CM | POA: Diagnosis not present

## 2019-12-19 DIAGNOSIS — F3289 Other specified depressive episodes: Secondary | ICD-10-CM

## 2019-12-19 MED ORDER — RYBELSUS 3 MG PO TABS: ORAL_TABLET | ORAL | 0 refills | Status: DC

## 2019-12-19 MED ORDER — BUPROPION HCL ER (SR) 200 MG PO TB12: 200.0000 mg | ORAL_TABLET | Freq: Every day | ORAL | 0 refills | Status: DC

## 2019-12-19 NOTE — Progress Notes (Signed)
Chief Complaint:   OBESITY [name] is here to discuss her progress with her obesity treatment plan along with follow-up of her obesity related diagnoses. [name] is on keeping a food journal and adhering to recommended goals of 1500-1800 calories and 100+ grams of protein daily and states she is following her eating plan approximately 75% of the time. Tal states she was riding the stationary bike 2 times in 3 weeks.  Today's visit was #: 8 Starting weight: 240 lbs Starting date: 08/03/2019 Today's weight: 220 lbs Today's date: 12/19/2019 Total lbs lost to date: 20 Total lbs lost since last in-office visit: 3  Interim History: [name] continues to do well with weight loss on her journaling plan. She is working on increasing lean protein but struggles with excessive hunger.  Subjective:   1. Pre-diabetes [name] notes polyphagia, worse in the afternoons. She tried metformin in the past but had significant GI upset.  2. Other depression with emotional eating [name] is stable on Wellbutrin. She is still working on decreasing emotional eating behaviors.   3. At risk for diabetes mellitus [name] is at higher than average risk for developing diabetes due to obesity.   Assessment/Plan:   1. Pre-diabetes [name] will continue to work on weight loss, exercise, and decreasing simple carbohydrates to help decrease the risk of diabetes. [name] agreed to start Rybelsus 3 mg q AM with no refills.  - Semaglutide (RYBELSUS) 3 MG TABS; Take 1 tablet by mouth once daily in early morning with no more than 4 oz. Of water  Dispense: 30 tablet; Refill: 0  2. Other depression with emotional eating Behavior modification techniques were discussed today to help [name] deal with her emotional/non-hunger eating behaviors. We will refill Wellbutrin SR for 1 month. Orders and follow up as documented in patient record.   - buPROPion (WELLBUTRIN SR) 200 MG 12 hr tablet; Take 1 tablet (200 mg total) by mouth daily.   Dispense: 30 tablet; Refill: 0  3. At risk for diabetes mellitus [name] was given approximately 15 minutes of diabetes education and counseling today. We discussed intensive lifestyle modifications today with an emphasis on weight loss as well as increasing exercise and decreasing simple carbohydrates in her diet. We also reviewed medication options with an emphasis on risk versus benefit of those discussed.   Repetitive spaced learning was employed today to elicit superior memory formation and behavioral change.  4. Class 2 severe obesity with serious comorbidity and body mass index (BMI) of 36.0 to 36.9 in adult, unspecified obesity type (HCC) [name] is currently in the action stage of change. As such, her goal is to continue with weight loss efforts. She has agreed to keeping a food journal and adhering to recommended goals of 1500-1800 calories and 100+ grams of protein daily.   Exercise goals: As is.  Behavioral modification strategies: meal planning and cooking strategies.  [name] has agreed to follow-up with our clinic in 2 to 3 weeks. She was informed of the importance of frequent follow-up visits to maximize her success with intensive lifestyle modifications for her multiple health conditions.   Objective:   Blood pressure 126/80, pulse 82, temperature 98.5 F (36.9 C), height 5\' 5"  (1.651 m), weight 220 lb (99.8 kg), last menstrual period 08/17/2014, SpO2 97 %. Body mass index is 36.61 kg/m.  General: Cooperative, alert, well developed, in no acute distress. HEENT: Conjunctivae and lids unremarkable. Cardiovascular: Regular rhythm.  Lungs: Normal work of breathing. Neurologic: No focal deficits.   Lab Results  Component Value Date   CREATININE 0.63 08/03/2019   BUN 10 08/03/2019   NA 140 08/03/2019   K 4.5 08/03/2019   CL 103 08/03/2019   CO2 23 08/03/2019   Lab Results  Component Value Date   ALT 15 08/03/2019   AST 12 08/03/2019   ALKPHOS 65 08/03/2019   BILITOT  0.2 08/03/2019   Lab Results  Component Value Date   HGBA1C 6.0 (H) 08/03/2019   HGBA1C 6.1 08/13/2017   HGBA1C 5.8 (H) 11/05/2016   Lab Results  Component Value Date   INSULIN 19.2 08/03/2019   INSULIN 12.9 11/05/2016   Lab Results  Component Value Date   TSH 2.19 07/14/2019   Lab Results  Component Value Date   CHOL 151 07/14/2019   HDL 39.30 07/14/2019   LDLCALC 93 07/14/2019   TRIG 94.0 07/14/2019   CHOLHDL 4 07/14/2019   Lab Results  Component Value Date   WBC 9.2 07/14/2019   HGB 13.2 07/14/2019   HCT 39.3 07/14/2019   MCV 93.7 07/14/2019   PLT 399.0 07/14/2019   No results found for: IRON, TIBC, FERRITIN  Attestation Statements:   Reviewed by clinician on day of visit: allergies, medications, problem list, medical history, surgical history, family history, social history, and previous encounter notes.   I, Trixie Dredge, am acting as transcriptionist for Dennard Nip, MD.  I have reviewed the above documentation for accuracy and completeness, and I agree with the above. -  Dennard Nip, MD

## 2020-01-04 LAB — HM MAMMOGRAPHY

## 2020-01-09 ENCOUNTER — Ambulatory Visit (INDEPENDENT_AMBULATORY_CARE_PROVIDER_SITE_OTHER): Payer: BC Managed Care – PPO | Admitting: Family Medicine

## 2020-01-12 ENCOUNTER — Other Ambulatory Visit (HOSPITAL_BASED_OUTPATIENT_CLINIC_OR_DEPARTMENT_OTHER): Payer: Self-pay | Admitting: Family Medicine

## 2020-01-12 ENCOUNTER — Other Ambulatory Visit: Payer: Self-pay | Admitting: Family Medicine

## 2020-01-12 ENCOUNTER — Telehealth: Payer: Self-pay

## 2020-01-12 ENCOUNTER — Encounter: Payer: Self-pay | Admitting: Family Medicine

## 2020-01-12 DIAGNOSIS — E876 Hypokalemia: Secondary | ICD-10-CM

## 2020-01-12 DIAGNOSIS — F418 Other specified anxiety disorders: Secondary | ICD-10-CM

## 2020-01-12 DIAGNOSIS — M8949 Other hypertrophic osteoarthropathy, multiple sites: Secondary | ICD-10-CM

## 2020-01-12 DIAGNOSIS — I10 Essential (primary) hypertension: Secondary | ICD-10-CM

## 2020-01-12 DIAGNOSIS — M159 Polyosteoarthritis, unspecified: Secondary | ICD-10-CM

## 2020-01-12 MED ORDER — TRAMADOL HCL 50 MG PO TABS: 50.0000 mg | ORAL_TABLET | Freq: Four times a day (QID) | ORAL | 0 refills | Status: DC | PRN

## 2020-01-12 MED ORDER — ESCITALOPRAM OXALATE 10 MG PO TABS: 10.0000 mg | ORAL_TABLET | Freq: Every day | ORAL | 1 refills | Status: DC

## 2020-01-12 MED ORDER — NEBIVOLOL HCL 5 MG PO TABS
5.0000 mg | ORAL_TABLET | Freq: Every day | ORAL | 1 refills | Status: DC
  Filled 2020-05-27: qty 90, 90d supply, fill #0

## 2020-01-12 MED FILL — BYSTOLIC 5 MG TABLET: 5 | 90 days supply | Qty: 90 | Fill #1

## 2020-01-12 MED FILL — AMLODIPINE BESYLATE 10 MG T: 10 | 90 days supply | Qty: 90 | Fill #0

## 2020-01-12 MED FILL — FUROSEMIDE 20 MG TAB: 20 | 90 days supply | Qty: 90 | Fill #0

## 2020-01-12 MED FILL — traMADol HCL 50 MG TABS: 50 | 7 days supply | Qty: 30 | Fill #0

## 2020-01-12 MED FILL — POTASSIUM CHL ER M10 TABLET: 10 | 90 days supply | Qty: 90 | Fill #0

## 2020-01-12 MED FILL — ESCITALOPRAM 10 MG TABLET: 10 | 90 days supply | Qty: 90 | Fill #0

## 2020-01-12 NOTE — Telephone Encounter (Signed)
PA approved. Effective 01/12/2020 to 07/12/2020.

## 2020-01-12 NOTE — Telephone Encounter (Signed)
PA initiated via Covermymeds; KEY:  [id number].

## 2020-01-12 NOTE — Telephone Encounter (Signed)
Requesting: tramadol 50mg  Contract: 11/10/2019 UDS: 11/13/2019 Last Visit: 11/10/2019 Next Visit: 05/13/2020 Last Refill: 11/10/2019 #30 and 0RF  Please Advise

## 2020-01-15 ENCOUNTER — Ambulatory Visit (INDEPENDENT_AMBULATORY_CARE_PROVIDER_SITE_OTHER): Payer: BC Managed Care – PPO | Admitting: Family Medicine

## 2020-01-15 ENCOUNTER — Other Ambulatory Visit: Payer: Self-pay

## 2020-01-15 ENCOUNTER — Encounter (INDEPENDENT_AMBULATORY_CARE_PROVIDER_SITE_OTHER): Payer: Self-pay | Admitting: Family Medicine

## 2020-01-15 VITALS — BP 134/82 | HR 70 | Temp 98.8°F | Ht 65.0 in | Wt 218.0 lb

## 2020-01-15 DIAGNOSIS — Z9189 Other specified personal risk factors, not elsewhere classified: Secondary | ICD-10-CM | POA: Diagnosis not present

## 2020-01-15 DIAGNOSIS — Z6836 Body mass index (BMI) 36.0-36.9, adult: Secondary | ICD-10-CM

## 2020-01-15 DIAGNOSIS — E559 Vitamin D deficiency, unspecified: Secondary | ICD-10-CM | POA: Diagnosis not present

## 2020-01-15 DIAGNOSIS — F3289 Other specified depressive episodes: Secondary | ICD-10-CM | POA: Diagnosis not present

## 2020-01-15 DIAGNOSIS — R7303 Prediabetes: Secondary | ICD-10-CM | POA: Diagnosis not present

## 2020-01-15 MED ORDER — BUPROPION HCL ER (SR) 200 MG PO TB12: 200.0000 mg | ORAL_TABLET | Freq: Every day | ORAL | 0 refills | Status: DC

## 2020-01-15 MED ORDER — RYBELSUS 3 MG PO TABS: ORAL_TABLET | ORAL | 0 refills | Status: DC

## 2020-01-15 MED ORDER — VITAMIN D (ERGOCALCIFEROL) 1.25 MG (50000 UNIT) PO CAPS
ORAL_CAPSULE | ORAL | 0 refills | Status: DC
Start: 2020-01-15 — End: 2020-02-13

## 2020-01-16 NOTE — Progress Notes (Signed)
Chief Complaint:   OBESITY [name] is here to discuss her progress with her obesity treatment plan along with follow-up of her obesity related diagnoses. [name] is on keeping a food journal and adhering to recommended goals of 1500-1800 calories and 100+ grams of protein daily and states she is following her eating plan approximately 80% of the time. [name] states she is doing 0 minutes 0 times per week.  Today's visit was #: 9 Starting weight: 240 lbs Starting date: 08/03/2019 Today's weight: 218 lbs Today's date: 01/15/2020 Total lbs lost to date: 22 Total lbs lost since last in-office visit: 2  Interim History: [name] continues to do very well with weight loss even with increased temptations. She is still journaling regularly, and she is doing well meeting her protein goals.  Subjective:   1. Vitamin D deficiency [name] is stable on Vit D, and she denies nausea or vomiting. She requests a refill today.  2. Pre-diabetes [name] is doing well with taking her Rybelsus properly. She denies nausea or vomiting, but notes decreased polyphagia  3. Other depression with emotional eating [name]'s mood is stable on her medications. She is doing well with minimizing emotional eating behaviors. She is sleeping approximately 9-10 hours per night and she doesn't always wake up refreshed.  4. At risk for heart disease [name] is at a higher than average risk for cardiovascular disease due to obesity.   Assessment/Plan:   1. Vitamin D deficiency Low Vitamin D level contributes to fatigue and are associated with obesity, breast, and colon cancer. We will refill prescription Vitamin D for 1 month. Dois will follow-up for routine testing of Vitamin D, at least 2-3 times per year to avoid over-replacement.  - Vitamin D, Ergocalciferol, (DRISDOL) 1.25 MG (50000 UNIT) CAPS capsule; TAKE 1 CAPSULE BY MOUTH EVERY 7 DAYS  Dispense: 4 capsule; Refill: 0  2. Pre-diabetes [name] will continue to work on weight  loss, diet, exercise, and decreasing simple carbohydrates to help decrease the risk of diabetes. We will refill Rybelsus for 1 month.  - Semaglutide (RYBELSUS) 3 MG TABS; Take 1 tablet by mouth once daily in early morning with no more than 4 oz. Of water  Dispense: 30 tablet; Refill: 0  3. Other depression with emotional eating Behavior modification techniques were discussed today to help [name] deal with her emotional/non-hunger eating behaviors. We will refill Wellbutrin SR for 1 month. [name] will continue Lexapro and will continue to monitor her sleep. Orders and follow up as documented in patient record.   - buPROPion (WELLBUTRIN SR) 200 MG 12 hr tablet; Take 1 tablet (200 mg total) by mouth daily.  Dispense: 30 tablet; Refill: 0  4. At risk for heart disease [name] was given approximately 15 minutes of coronary artery disease prevention counseling today. She is 49 y.o. female and has risk factors for heart disease including obesity. We discussed intensive lifestyle modifications today with an emphasis on specific weight loss instructions and strategies.   Repetitive spaced learning was employed today to elicit superior memory formation and behavioral change.  5. Class 2 severe obesity with serious comorbidity and body mass index (BMI) of 36.0 to 36.9 in adult, unspecified obesity type (HCC) [name] is currently in the action stage of change. As such, her goal is to continue with weight loss efforts. She has agreed to keeping a food journal and adhering to recommended goals of 1500-1800 calories and 100+ grams of protein.   Behavioral modification strategies: holiday eating strategies .  [name] has agreed to follow-up with our clinic in 3 to 4 weeks. She was informed of the importance of frequent follow-up visits to maximize her success with intensive lifestyle modifications for her multiple health conditions.   Objective:   Blood pressure 134/82, pulse 70, temperature 98.8 F (37.1 C), height  5\' 5"  (1.651 m), weight 218 lb (98.9 kg), last menstrual period 08/17/2014, SpO2 99 %. Body mass index is 36.28 kg/m.  General: Cooperative, alert, well developed, in no acute distress. HEENT: Conjunctivae and lids unremarkable. Cardiovascular: Regular rhythm.  Lungs: Normal work of breathing. Neurologic: No focal deficits.   Lab Results  Component Value Date   CREATININE 0.63 08/03/2019   BUN 10 08/03/2019   NA 140 08/03/2019   K 4.5 08/03/2019   CL 103 08/03/2019   CO2 23 08/03/2019   Lab Results  Component Value Date   ALT 15 08/03/2019   AST 12 08/03/2019   ALKPHOS 65 08/03/2019   BILITOT 0.2 08/03/2019   Lab Results  Component Value Date   HGBA1C 6.0 (H) 08/03/2019   HGBA1C 6.1 08/13/2017   HGBA1C 5.8 (H) 11/05/2016   Lab Results  Component Value Date   INSULIN 19.2 08/03/2019   INSULIN 12.9 11/05/2016   Lab Results  Component Value Date   TSH 2.19 07/14/2019   Lab Results  Component Value Date   CHOL 151 07/14/2019   HDL 39.30 07/14/2019   LDLCALC 93 07/14/2019   TRIG 94.0 07/14/2019   CHOLHDL 4 07/14/2019   Lab Results  Component Value Date   WBC 9.2 07/14/2019   HGB 13.2 07/14/2019   HCT 39.3 07/14/2019   MCV 93.7 07/14/2019   PLT 399.0 07/14/2019   No results found for: IRON, TIBC, FERRITIN  Attestation Statements:   Reviewed by clinician on day of visit: allergies, medications, problem list, medical history, surgical history, family history, social history, and previous encounter notes.   I, Trixie Dredge, am acting as transcriptionist for Dennard Nip, MD.  I have reviewed the above documentation for accuracy and completeness, and I agree with the above. -  Dennard Nip, MD

## 2020-01-22 ENCOUNTER — Encounter: Payer: Self-pay | Admitting: Family Medicine

## 2020-01-22 DIAGNOSIS — R0981 Nasal congestion: Secondary | ICD-10-CM

## 2020-01-22 MED ORDER — AZITHROMYCIN 250 MG PO TABS: ORAL_TABLET | ORAL | 0 refills | Status: DC

## 2020-01-22 NOTE — Telephone Encounter (Signed)
[name] would like for you to call her   [phone]

## 2020-01-23 NOTE — Addendum Note (Signed)
Addended by: Pearline Cables on: 01/23/2020 06:43 AM   Modules accepted: Orders

## 2020-01-30 ENCOUNTER — Encounter: Payer: Self-pay | Admitting: Family Medicine

## 2020-01-30 ENCOUNTER — Other Ambulatory Visit: Payer: Self-pay | Admitting: Family Medicine

## 2020-01-30 DIAGNOSIS — N760 Acute vaginitis: Secondary | ICD-10-CM

## 2020-01-30 MED ORDER — FLUCONAZOLE 150 MG PO TABS: ORAL_TABLET | ORAL | 0 refills | Status: DC

## 2020-01-30 NOTE — Telephone Encounter (Signed)
Looks like [facility] sent patient some antibiotics. Please advise

## 2020-01-30 NOTE — Telephone Encounter (Signed)
I will send diflucan in---- the z pak stays in system for 10 day but unless symptoms are severe more antibiotics should not be needed

## 2020-01-30 NOTE — Telephone Encounter (Signed)
Patient has question in regards to covid

## 2020-01-31 ENCOUNTER — Encounter: Payer: Self-pay | Admitting: Family Medicine

## 2020-01-31 ENCOUNTER — Telehealth (INDEPENDENT_AMBULATORY_CARE_PROVIDER_SITE_OTHER): Payer: BC Managed Care – PPO | Admitting: Family Medicine

## 2020-01-31 ENCOUNTER — Other Ambulatory Visit: Payer: Self-pay

## 2020-01-31 VITALS — Temp 100.5°F

## 2020-01-31 DIAGNOSIS — U071 COVID-19: Secondary | ICD-10-CM

## 2020-01-31 DIAGNOSIS — J40 Bronchitis, not specified as acute or chronic: Secondary | ICD-10-CM

## 2020-01-31 MED ORDER — AMOXICILLIN-POT CLAVULANATE 875-125 MG PO TABS
1.0000 | ORAL_TABLET | Freq: Two times a day (BID) | ORAL | 0 refills | Status: DC
Start: 2020-01-31 — End: 2020-07-12

## 2020-01-31 MED ORDER — PROMETHAZINE-DM 6.25-15 MG/5ML PO SYRP: 5.0000 mL | ORAL_SOLUTION | Freq: Four times a day (QID) | ORAL | 0 refills | Status: DC | PRN

## 2020-01-31 NOTE — Progress Notes (Signed)
Virtual Visit via Video Note  I connected with [name] on 01/31/20 at 10:00 AM EST by a video enabled telemedicine application and verified that I am speaking with the correct person using two identifiers.  Location: Patient: home alone  Provider: home    I discussed the limitations of evaluation and management by telemedicine and the availability of in person appointments. The patient expressed understanding and agreed to proceed.  History of Present Illness: Pt is home not feeling well ---- cough, fever and congestion ---- + covid test  + cough is productive   + z pak  Past Medical History:  Diagnosis Date  . Anemia   . Anxiety   . Arthritis   . Depression   . Fatigue   . Fibroids    uterine  . Hypertension   . Insulin resistance   . Joint pain   . Lower extremity edema   . Menorrhagia   . Pre-diabetes   . Umbilical hernia   . Vitamin D deficiency    Outpatient Encounter Medications as of 01/31/2020  Medication Sig  . amLODipine (NORVASC) 10 MG tablet Take 1 tablet (10 mg total) by mouth daily.  Marland Kitchen amoxicillin-clavulanate (AUGMENTIN) 875-125 MG tablet Take 1 tablet by mouth 2 (two) times daily.  Marland Kitchen buPROPion (WELLBUTRIN SR) 200 MG 12 hr tablet Take 1 tablet (200 mg total) by mouth daily.  Marland Kitchen escitalopram (LEXAPRO) 10 MG tablet Take 1 tablet (10 mg total) by mouth at bedtime.  . fluconazole (DIFLUCAN) 150 MG tablet TAKE 1 TABLET BY MOUTH TODAY, MAY REPEAT IN 3 DAYS AS NEEDED  . furosemide (LASIX) 20 MG tablet Take 1 tablet (20 mg total) by mouth daily.  Marland Kitchen LORazepam (ATIVAN) 1 MG tablet Take 1 tablet (1 mg total) by mouth at bedtime.  . nebivolol (BYSTOLIC) 5 MG tablet Take 1 tablet (5 mg total) by mouth daily.  . potassium chloride (KLOR-CON) 10 MEQ tablet Take 1 tablet (10 mEq total) by mouth daily.  . promethazine-dextromethorphan (PROMETHAZINE-DM) 6.25-15 MG/5ML syrup Take 5 mLs by mouth 4 (four) times daily as needed.  . Semaglutide (RYBELSUS) 3 MG TABS Take 1 tablet  by mouth once daily in early morning with no more than 4 oz. Of water  . traMADol (ULTRAM) 50 MG tablet Take 1 tablet (50 mg total) by mouth every 6 (six) hours as needed.  . Vitamin D, Ergocalciferol, (DRISDOL) 1.25 MG (50000 UNIT) CAPS capsule TAKE 1 CAPSULE BY MOUTH EVERY 7 DAYS  . [DISCONTINUED] azithromycin (ZITHROMAX) 250 MG tablet Use as a zpack  . [DISCONTINUED] fluconazole (DIFLUCAN) 150 MG tablet 1 po x1, may repeat in 3 days prn   No facility-administered encounter medications on file as of 01/31/2020.   Observations/Objective: Vitals:   01/31/20 0935  Temp: (!) 100.5 F (38.1 C)   Pt coughing--- no wheezing, no sob  Pt is in nad   Assessment and Plan: 1. COVID-19 abx per orders Second test pending but first one was positive last week Consider covid clinic if no improvement  - amoxicillin-clavulanate (AUGMENTIN) 875-125 MG tablet; Take 1 tablet by mouth 2 (two) times daily.  Dispense: 20 tablet; Refill: 0 - DG Chest 2 View; Future  2. Bronchitis due to COVID-19 virus abx and cough meds per orders  cxr  covid clinic if no better  - amoxicillin-clavulanate (AUGMENTIN) 875-125 MG tablet; Take 1 tablet by mouth 2 (two) times daily.  Dispense: 20 tablet; Refill: 0 - DG Chest 2 View; Future - promethazine-dextromethorphan (PROMETHAZINE-DM)  6.25-15 MG/5ML syrup; Take 5 mLs by mouth 4 (four) times daily as needed.  Dispense: 118 mL; Refill: 0   Follow Up Instructions:    I discussed the assessment and treatment plan with the patient. The patient was provided an opportunity to ask questions and all were answered. The patient agreed with the plan and demonstrated an understanding of the instructions.   The patient was advised to call back or seek an in-person evaluation if the symptoms worsen or if the condition fails to improve as anticipated.  I provided 25 minutes of non-face-to-face time during this encounter.   Ann Held, DO

## 2020-01-31 NOTE — Telephone Encounter (Signed)
Patient will address at appointment today

## 2020-01-31 NOTE — Telephone Encounter (Signed)
Patient will address at visit.

## 2020-02-12 ENCOUNTER — Other Ambulatory Visit (INDEPENDENT_AMBULATORY_CARE_PROVIDER_SITE_OTHER): Payer: Self-pay | Admitting: Family Medicine

## 2020-02-12 DIAGNOSIS — E559 Vitamin D deficiency, unspecified: Secondary | ICD-10-CM

## 2020-02-12 DIAGNOSIS — R7303 Prediabetes: Secondary | ICD-10-CM

## 2020-02-13 ENCOUNTER — Other Ambulatory Visit: Payer: Self-pay

## 2020-02-13 ENCOUNTER — Encounter (INDEPENDENT_AMBULATORY_CARE_PROVIDER_SITE_OTHER): Payer: Self-pay | Admitting: Family Medicine

## 2020-02-13 ENCOUNTER — Telehealth (INDEPENDENT_AMBULATORY_CARE_PROVIDER_SITE_OTHER): Payer: BC Managed Care – PPO | Admitting: Family Medicine

## 2020-02-13 DIAGNOSIS — F418 Other specified anxiety disorders: Secondary | ICD-10-CM | POA: Diagnosis not present

## 2020-02-13 DIAGNOSIS — E559 Vitamin D deficiency, unspecified: Secondary | ICD-10-CM | POA: Diagnosis not present

## 2020-02-13 DIAGNOSIS — F3289 Other specified depressive episodes: Secondary | ICD-10-CM

## 2020-02-13 DIAGNOSIS — R7303 Prediabetes: Secondary | ICD-10-CM | POA: Diagnosis not present

## 2020-02-13 DIAGNOSIS — Z6836 Body mass index (BMI) 36.0-36.9, adult: Secondary | ICD-10-CM

## 2020-02-13 MED ORDER — RYBELSUS 3 MG PO TABS
ORAL_TABLET | ORAL | 0 refills | Status: DC
Start: 2020-02-13 — End: 2020-03-26

## 2020-02-13 MED ORDER — BUPROPION HCL ER (SR) 200 MG PO TB12: 200.0000 mg | ORAL_TABLET | Freq: Every day | ORAL | 0 refills | Status: DC

## 2020-02-13 MED ORDER — VITAMIN D (ERGOCALCIFEROL) 1.25 MG (50000 UNIT) PO CAPS: ORAL_CAPSULE | ORAL | 0 refills | Status: DC

## 2020-02-14 ENCOUNTER — Ambulatory Visit (INDEPENDENT_AMBULATORY_CARE_PROVIDER_SITE_OTHER): Payer: BC Managed Care – PPO | Admitting: Family Medicine

## 2020-02-14 NOTE — Progress Notes (Signed)
TeleHealth Visit:  Due to the COVID-19 pandemic, this visit was completed with telemedicine (audio/video) technology to reduce patient and provider exposure as well as to preserve personal protective equipment.   [name] has verbally consented to this TeleHealth visit. The patient is located at home, the provider is located at the [facility] office. The participants in this visit include the listed provider and patient. The visit was conducted today via MyChart video.   Chief Complaint: OBESITY [name] is here to discuss her progress with her obesity treatment plan along with follow-up of her obesity related diagnoses. [name] is on keeping a food journal and adhering to recommended goals of 1500-1800 calories and 100+ grams of protein daily and states she is following her eating plan approximately 0% of the time. [name] states she is doing 0 minutes 0 times per week.  Today's visit was #: 10 Starting weight: 240 lbs Starting date: 01/15/2020  Interim History: [name] feels she has gained a couple of lbs over the holidays. She is recovering from [place] when she lost her sense of taste and smell, but she is starting to get this back. She is ready to get back to journaling.  Subjective:   1. Vitamin D deficiency [name] is stable on Vit D, and she is due for labs soon.  2. Pre-diabetes [name] is stable on Rybelsus, and she denies nausea or vomiting. She requests a refill today. She is working on getting back to her journaling plan.  3. Depression with anxiety [name] is stable on her medications, and she appears to be in good spirits even while recovering from [place]. She notes struggling with increased cravings recently.  Assessment/Plan:   1. Vitamin D deficiency Low Vitamin D level contributes to fatigue and are associated with obesity, breast, and colon cancer. We will refill prescription Vitamin D for 1 month, and we will recheck labs at her next visit. [name] will follow-up for  routine testing of Vitamin D, at least 2-3 times per year to avoid over-replacement.  - Vitamin D, Ergocalciferol, (DRISDOL) 1.25 MG (50000 UNIT) CAPS capsule; TAKE 1 CAPSULE BY MOUTH EVERY 7 DAYS  Dispense: 4 capsule; Refill: 0  2. Pre-diabetes [name] will continue to work on weight loss, exercise, and decreasing simple carbohydrates to help decrease the risk of diabetes. We will refill Rybelsus for 1 month.  - Semaglutide (RYBELSUS) 3 MG TABS; Take 1 tablet by mouth once daily in early morning with no more than 4 oz. Of water  Dispense: 30 tablet; Refill: 0  3. Depression with anxiety Behavior modification techniques were discussed today to help [name] deal with her emotional/non-hunger eating behaviors. We will refill Wellbutrin SR for 1 month. [name] will continue Lexapro and will continue to monitor her cravings, we may need to adjust her medications if symptoms fail to improve. Orders and follow up as documented in patient record.   - buPROPion (WELLBUTRIN SR) 200 MG 12 hr tablet; Take 1 tablet (200 mg total) by mouth daily.  Dispense: 30 tablet; Refill: 0  4. Class 2 severe obesity with serious comorbidity and body mass index (BMI) of 36.0 to 36.9 in adult, unspecified obesity type (HCC) [name] is currently in the action stage of change. As such, her goal is to continue with weight loss efforts. She has agreed to keeping a food journal and adhering to recommended goals of 1500-1800 calories and 100+ grams of protein daily.   Behavioral modification strategies: increasing lean protein intake and meal planning and  cooking strategies.  [name] has agreed to follow-up with our clinic in 2 to 3 weeks. She was informed of the importance of frequent follow-up visits to maximize her success with intensive lifestyle modifications for her multiple health conditions.  Objective:   VITALS: Per patient if applicable, see vitals. GENERAL: Alert and in no acute distress. CARDIOPULMONARY: No increased WOB.  Speaking in clear sentences.  PSYCH: Pleasant and cooperative. Speech normal rate and rhythm. Affect is appropriate. Insight and judgement are appropriate. Attention is focused, linear, and appropriate.  NEURO: Oriented as arrived to appointment on time with no prompting.   Lab Results  Component Value Date   CREATININE 0.63 08/03/2019   BUN 10 08/03/2019   NA 140 08/03/2019   K 4.5 08/03/2019   CL 103 08/03/2019   CO2 23 08/03/2019   Lab Results  Component Value Date   ALT 15 08/03/2019   AST 12 08/03/2019   ALKPHOS 65 08/03/2019   BILITOT 0.2 08/03/2019   Lab Results  Component Value Date   HGBA1C 6.0 (H) 08/03/2019   HGBA1C 6.1 08/13/2017   HGBA1C 5.8 (H) 11/05/2016   Lab Results  Component Value Date   INSULIN 19.2 08/03/2019   INSULIN 12.9 11/05/2016   Lab Results  Component Value Date   TSH 2.19 07/14/2019   Lab Results  Component Value Date   CHOL 151 07/14/2019   HDL 39.30 07/14/2019   LDLCALC 93 07/14/2019   TRIG 94.0 07/14/2019   CHOLHDL 4 07/14/2019   Lab Results  Component Value Date   WBC 9.2 07/14/2019   HGB 13.2 07/14/2019   HCT 39.3 07/14/2019   MCV 93.7 07/14/2019   PLT 399.0 07/14/2019   No results found for: IRON, TIBC, FERRITIN  Attestation Statements:   Reviewed by clinician on day of visit: allergies, medications, problem list, medical history, surgical history, family history, social history, and previous encounter notes.   I, Trixie Dredge, am acting as transcriptionist for Dennard Nip, MD.  I have reviewed the above documentation for accuracy and completeness, and I agree with the above. - Dennard Nip, MD

## 2020-02-29 ENCOUNTER — Other Ambulatory Visit: Payer: Self-pay | Admitting: Family Medicine

## 2020-02-29 DIAGNOSIS — M8949 Other hypertrophic osteoarthropathy, multiple sites: Secondary | ICD-10-CM

## 2020-02-29 DIAGNOSIS — M159 Polyosteoarthritis, unspecified: Secondary | ICD-10-CM

## 2020-02-29 MED FILL — traMADol HCL 50 MG TABS: 50 | 7 days supply | Qty: 30 | Fill #0

## 2020-02-29 NOTE — Telephone Encounter (Signed)
Requesting: Tramadol  Contract: 11/13/19  UDS: 11/13/19  Last Visit: 01/31/20- video, 11/10/19 OV Next Visit: 05/13/2020 Last Refill: 01/12/2020  Please Advise

## 2020-03-12 ENCOUNTER — Other Ambulatory Visit: Payer: Self-pay

## 2020-03-12 ENCOUNTER — Ambulatory Visit (INDEPENDENT_AMBULATORY_CARE_PROVIDER_SITE_OTHER): Payer: BC Managed Care – PPO | Admitting: Family Medicine

## 2020-03-12 ENCOUNTER — Encounter (INDEPENDENT_AMBULATORY_CARE_PROVIDER_SITE_OTHER): Payer: Self-pay | Admitting: Family Medicine

## 2020-03-12 VITALS — BP 136/82 | HR 80 | Temp 98.2°F | Ht 65.0 in | Wt 223.0 lb

## 2020-03-12 DIAGNOSIS — Z6837 Body mass index (BMI) 37.0-37.9, adult: Secondary | ICD-10-CM

## 2020-03-12 DIAGNOSIS — K5901 Slow transit constipation: Secondary | ICD-10-CM

## 2020-03-12 DIAGNOSIS — Z9189 Other specified personal risk factors, not elsewhere classified: Secondary | ICD-10-CM

## 2020-03-12 DIAGNOSIS — R7303 Prediabetes: Secondary | ICD-10-CM | POA: Diagnosis not present

## 2020-03-12 DIAGNOSIS — G4700 Insomnia, unspecified: Secondary | ICD-10-CM

## 2020-03-14 NOTE — Progress Notes (Signed)
Chief Complaint:   OBESITY [name] is here to discuss her progress with her obesity treatment plan along with follow-up of her obesity related diagnoses. [name] is on keeping a food journal and adhering to recommended goals of 1500-1800 calories and 100+ grams of protein daily and states she is following her eating plan approximately 50% of the time. Kerby states she is walking 8,000 steps daily.   Today's visit was #: 11 Starting weight: 240 lbs Starting date: 01/15/2020 Today's weight: 223 lbs Today's date: 03/12/2020 Total lbs lost to date: 17 Total lbs lost since last in-office visit: 0  Interim History: [name] has had a lot of work stress and celebration eating lately, and she has gained a few lbs. She is ready to get back on track and she is open to look at other eating plan options.  Subjective:   1. Pre-diabetes [name] is stable on Rybelsus, and no side effects noted. She has struggled with weight loss recently, but she is ready to get back on track.  2. Constipation due to slow transit [name] notes increased constipation in the last few weeks and she wonders if it is related to her eating.  3. At risk for heart disease [name] is at a higher than average risk for cardiovascular disease due to obesity.   Assessment/Plan:   1. Pre-diabetes [name] will continue Rybelsus as is, and will continue to work on weight loss, exercise, and decreasing simple carbohydrates to help decrease the risk of diabetes. We will continue to monitor.  2. Constipation due to slow transit [name] was informed that a decrease in bowel movement frequency is normal while losing weight, but stools should not be hard or painful. [name] agreed to start miralax 17 g OTC daily. She was informed that increasing fiber and water in her diet can help and increasing exercise will help as well. We will continue to monitor. Orders and follow up as documented in patient record.   3. At risk for heart disease [name] was  given approximately 15 minutes of coronary artery disease prevention counseling today. She is 50 y.o. female and has risk factors for heart disease including obesity. We discussed intensive lifestyle modifications today with an emphasis on specific weight loss instructions and strategies.   Repetitive spaced learning was employed today to elicit superior memory formation and behavioral change.  4. Class 2 severe obesity with serious comorbidity and body mass index (BMI) of 37.0 to 37.9 in adult, unspecified obesity type (HCC) [name] is currently in the action stage of change. As such, her goal is to continue with weight loss efforts. She has agreed to change to the Category 3 Plan or following a lower carbohydrate, vegetable and lean protein rich diet plan.   Exercise goals: As is, increase frequency.  Behavioral modification strategies: increasing lean protein intake.  [name] has agreed to follow-up with our clinic in 3 weeks. She was informed of the importance of frequent follow-up visits to maximize her success with intensive lifestyle modifications for her multiple health conditions.   Objective:   Blood pressure 136/82, pulse 80, temperature 98.2 F (36.8 C), height 5\' 5"  (1.651 m), weight 223 lb (101.2 kg), last menstrual period 08/17/2014, SpO2 99 %. Body mass index is 37.11 kg/m.  General: Cooperative, alert, well developed, in no acute distress. HEENT: Conjunctivae and lids unremarkable. Cardiovascular: Regular rhythm.  Lungs: Normal work of breathing. Neurologic: No focal deficits.   Lab Results  Component Value Date   CREATININE 0.63 08/03/2019  BUN 10 08/03/2019   NA 140 08/03/2019   K 4.5 08/03/2019   CL 103 08/03/2019   CO2 23 08/03/2019   Lab Results  Component Value Date   ALT 15 08/03/2019   AST 12 08/03/2019   ALKPHOS 65 08/03/2019   BILITOT 0.2 08/03/2019   Lab Results  Component Value Date   HGBA1C 6.0 (H) 08/03/2019   HGBA1C 6.1 08/13/2017   HGBA1C  5.8 (H) 11/05/2016   Lab Results  Component Value Date   INSULIN 19.2 08/03/2019   INSULIN 12.9 11/05/2016   Lab Results  Component Value Date   TSH 2.19 07/14/2019   Lab Results  Component Value Date   CHOL 151 07/14/2019   HDL 39.30 07/14/2019   LDLCALC 93 07/14/2019   TRIG 94.0 07/14/2019   CHOLHDL 4 07/14/2019   Lab Results  Component Value Date   WBC 9.2 07/14/2019   HGB 13.2 07/14/2019   HCT 39.3 07/14/2019   MCV 93.7 07/14/2019   PLT 399.0 07/14/2019   No results found for: IRON, TIBC, FERRITIN  Attestation Statements:   Reviewed by clinician on day of visit: allergies, medications, problem list, medical history, surgical history, family history, social history, and previous encounter notes.   I, Trixie Dredge, am acting as transcriptionist for Dennard Nip, MD.  I have reviewed the above documentation for accuracy and completeness, and I agree with the above. -  Dennard Nip, MD

## 2020-03-26 ENCOUNTER — Ambulatory Visit (INDEPENDENT_AMBULATORY_CARE_PROVIDER_SITE_OTHER): Payer: BC Managed Care – PPO | Admitting: Family Medicine

## 2020-03-26 ENCOUNTER — Other Ambulatory Visit: Payer: Self-pay

## 2020-03-26 ENCOUNTER — Encounter (INDEPENDENT_AMBULATORY_CARE_PROVIDER_SITE_OTHER): Payer: Self-pay | Admitting: Family Medicine

## 2020-03-26 VITALS — BP 131/84 | HR 81 | Temp 98.6°F | Ht 65.0 in | Wt 222.0 lb

## 2020-03-26 DIAGNOSIS — Z6837 Body mass index (BMI) 37.0-37.9, adult: Secondary | ICD-10-CM

## 2020-03-26 DIAGNOSIS — Z9189 Other specified personal risk factors, not elsewhere classified: Secondary | ICD-10-CM

## 2020-03-26 DIAGNOSIS — F3289 Other specified depressive episodes: Secondary | ICD-10-CM

## 2020-03-26 DIAGNOSIS — E8881 Metabolic syndrome: Secondary | ICD-10-CM | POA: Diagnosis not present

## 2020-03-26 DIAGNOSIS — E559 Vitamin D deficiency, unspecified: Secondary | ICD-10-CM

## 2020-03-26 MED ORDER — BUPROPION HCL ER (SR) 200 MG PO TB12: 200.0000 mg | ORAL_TABLET | Freq: Every day | ORAL | 0 refills | Status: DC

## 2020-03-26 MED ORDER — RYBELSUS 3 MG PO TABS: ORAL_TABLET | ORAL | 0 refills | Status: DC

## 2020-03-26 MED ORDER — VITAMIN D (ERGOCALCIFEROL) 1.25 MG (50000 UNIT) PO CAPS: ORAL_CAPSULE | ORAL | 0 refills | Status: DC

## 2020-03-26 NOTE — Progress Notes (Signed)
Chief Complaint:   OBESITY [name] is here to discuss her progress with her obesity treatment plan along with follow-up of her obesity related diagnoses. [name] is on the Category 3 Plan or following a lower carbohydrate, vegetable and lean protein rich diet plan and states she is following her eating plan approximately 75% of the time. [name] states she is doing 0 minutes 0 times per week.  Today's visit was #: 12 Starting weight: 240 lbs Starting date: 01/15/2020 Today's weight: 222 lbs Today's date: 03/26/2020 Total lbs lost to date: 18 Total lbs lost since last in-office visit: 1  Interim History: [name] continues to do well with weight loss. She feels she does better with journaling and struggles most with the time factor of meal planning and prepping.  Subjective:   1. Vitamin D deficiency [name] is stable on Vit D, and she denies nausea or vomiting. She requests a refill today.  2. Insulin resistance [name] is doing well with diet, but she has some mild occasional GI issues with Rybelsus.  3. Other depression with emotional eating [name] is doing well on her medications, and decreasing emotional eating behaviors. Her blood pressure is stable, and she denies worsening insomnia.  4. At risk for diabetes mellitus [name] is at higher than average risk for developing diabetes due to obesity.   Assessment/Plan:   1. Vitamin D deficiency Low Vitamin D level contributes to fatigue and are associated with obesity, breast, and colon cancer. We will refill prescription Vitamin D for 1 month. [name] will follow-up for routine testing of Vitamin D, at least 2-3 times per year to avoid over-replacement.  - Vitamin D, Ergocalciferol, (DRISDOL) 1.25 MG (50000 UNIT) CAPS capsule; TAKE 1 CAPSULE BY MOUTH EVERY 7 DAYS  Dispense: 4 capsule; Refill: 0  2. Insulin resistance Manroop will continue to work on weight loss, exercise, and decreasing simple carbohydrates to help decrease the risk of  diabetes. We will refill Rybelsus for 1 month, and she is to make sure to eat approximately 30 minutes after taking it to decrease GI issues. [name] agreed to follow-up with [name] as directed to closely monitor her progress.  - Semaglutide (RYBELSUS) 3 MG TABS; Take 1 tablet by mouth once daily in early morning with no more than 4 oz. Of water  Dispense: 30 tablet; Refill: 0  3. Other depression with emotional eating Behavior modification techniques were discussed today to help [name] deal with her emotional/non-hunger eating behaviors. We will refill Wellbutrin SR for 1 month. Orders and follow up as documented in patient record.   - buPROPion (WELLBUTRIN SR) 200 MG 12 hr tablet; Take 1 tablet (200 mg total) by mouth daily.  Dispense: 30 tablet; Refill: 0  4. At risk for diabetes mellitus [name] was given approximately 15 minutes of diabetes education and counseling today. We discussed intensive lifestyle modifications today with an emphasis on weight loss as well as increasing exercise and decreasing simple carbohydrates in her diet. We also reviewed medication options with an emphasis on risk versus benefit of those discussed.   Repetitive spaced learning was employed today to elicit superior memory formation and behavioral change.  5. Class 2 severe obesity with serious comorbidity and body mass index (BMI) of 37.0 to 37.9 in adult, unspecified obesity type (HCC) [name] is currently in the action stage of change. As such, her goal is to continue with weight loss efforts. She has agreed to keeping a food journal and adhering to recommended goals of 1500-1800 calories  and 100+ grams of protein daily.   Easy to prep recipe ideas were discussed today.  Behavioral modification strategies: increasing lean protein intake and meal planning and cooking strategies.  [name] has agreed to follow-up with our clinic in 2 to 3 weeks. She was informed of the importance of frequent follow-up visits to maximize her  success with intensive lifestyle modifications for her multiple health conditions.   Objective:   Blood pressure 131/84, pulse 81, temperature 98.6 F (37 C), height 5\' 5"  (1.651 m), weight 222 lb (100.7 kg), last menstrual period 08/17/2014, SpO2 100 %. Body mass index is 36.94 kg/m.  General: Cooperative, alert, well developed, in no acute distress. HEENT: Conjunctivae and lids unremarkable. Cardiovascular: Regular rhythm.  Lungs: Normal work of breathing. Neurologic: No focal deficits.   Lab Results  Component Value Date   CREATININE 0.63 08/03/2019   BUN 10 08/03/2019   NA 140 08/03/2019   K 4.5 08/03/2019   CL 103 08/03/2019   CO2 23 08/03/2019   Lab Results  Component Value Date   ALT 15 08/03/2019   AST 12 08/03/2019   ALKPHOS 65 08/03/2019   BILITOT 0.2 08/03/2019   Lab Results  Component Value Date   HGBA1C 6.0 (H) 08/03/2019   HGBA1C 6.1 08/13/2017   HGBA1C 5.8 (H) 11/05/2016   Lab Results  Component Value Date   INSULIN 19.2 08/03/2019   INSULIN 12.9 11/05/2016   Lab Results  Component Value Date   TSH 2.19 07/14/2019   Lab Results  Component Value Date   CHOL 151 07/14/2019   HDL 39.30 07/14/2019   LDLCALC 93 07/14/2019   TRIG 94.0 07/14/2019   CHOLHDL 4 07/14/2019   Lab Results  Component Value Date   WBC 9.2 07/14/2019   HGB 13.2 07/14/2019   HCT 39.3 07/14/2019   MCV 93.7 07/14/2019   PLT 399.0 07/14/2019   No results found for: IRON, TIBC, FERRITIN  Attestation Statements:   Reviewed by clinician on day of visit: allergies, medications, problem list, medical history, surgical history, family history, social history, and previous encounter notes.   I, Trixie Dredge, am acting as transcriptionist for Dennard Nip, MD.  I have reviewed the above documentation for accuracy and completeness, and I agree with the above. -  Dennard Nip, MD

## 2020-03-27 ENCOUNTER — Ambulatory Visit (INDEPENDENT_AMBULATORY_CARE_PROVIDER_SITE_OTHER): Payer: BC Managed Care – PPO | Admitting: Family Medicine

## 2020-03-27 ENCOUNTER — Encounter (INDEPENDENT_AMBULATORY_CARE_PROVIDER_SITE_OTHER): Payer: Self-pay

## 2020-04-10 ENCOUNTER — Ambulatory Visit (INDEPENDENT_AMBULATORY_CARE_PROVIDER_SITE_OTHER): Payer: BC Managed Care – PPO | Admitting: Family Medicine

## 2020-04-18 ENCOUNTER — Ambulatory Visit (INDEPENDENT_AMBULATORY_CARE_PROVIDER_SITE_OTHER): Payer: BC Managed Care – PPO | Admitting: Family Medicine

## 2020-04-25 ENCOUNTER — Other Ambulatory Visit (INDEPENDENT_AMBULATORY_CARE_PROVIDER_SITE_OTHER): Payer: Self-pay | Admitting: Family Medicine

## 2020-04-25 DIAGNOSIS — F3289 Other specified depressive episodes: Secondary | ICD-10-CM

## 2020-04-25 NOTE — Telephone Encounter (Signed)
Ok x 1

## 2020-04-25 NOTE — Telephone Encounter (Signed)
Would you like to refill? 

## 2020-04-29 ENCOUNTER — Other Ambulatory Visit (INDEPENDENT_AMBULATORY_CARE_PROVIDER_SITE_OTHER): Payer: Self-pay | Admitting: Family Medicine

## 2020-04-29 DIAGNOSIS — E8881 Metabolic syndrome: Secondary | ICD-10-CM

## 2020-04-29 DIAGNOSIS — E559 Vitamin D deficiency, unspecified: Secondary | ICD-10-CM

## 2020-05-01 ENCOUNTER — Ambulatory Visit (INDEPENDENT_AMBULATORY_CARE_PROVIDER_SITE_OTHER): Payer: BC Managed Care – PPO | Admitting: Family Medicine

## 2020-05-03 ENCOUNTER — Encounter: Payer: Self-pay | Admitting: Family Medicine

## 2020-05-03 ENCOUNTER — Other Ambulatory Visit: Payer: Self-pay | Admitting: Family Medicine

## 2020-05-03 DIAGNOSIS — Z1211 Encounter for screening for malignant neoplasm of colon: Secondary | ICD-10-CM

## 2020-05-03 NOTE — Telephone Encounter (Signed)
I have placed a referral for colon

## 2020-05-07 ENCOUNTER — Other Ambulatory Visit (INDEPENDENT_AMBULATORY_CARE_PROVIDER_SITE_OTHER): Payer: Self-pay | Admitting: Family Medicine

## 2020-05-07 DIAGNOSIS — E8881 Metabolic syndrome: Secondary | ICD-10-CM

## 2020-05-12 ENCOUNTER — Encounter: Payer: Self-pay | Admitting: Family Medicine

## 2020-05-13 ENCOUNTER — Encounter: Payer: BC Managed Care – PPO | Admitting: Family Medicine

## 2020-05-14 ENCOUNTER — Encounter (INDEPENDENT_AMBULATORY_CARE_PROVIDER_SITE_OTHER): Payer: Self-pay

## 2020-05-14 ENCOUNTER — Encounter (INDEPENDENT_AMBULATORY_CARE_PROVIDER_SITE_OTHER): Payer: Self-pay | Admitting: Family Medicine

## 2020-05-14 ENCOUNTER — Other Ambulatory Visit: Payer: Self-pay

## 2020-05-14 ENCOUNTER — Telehealth (INDEPENDENT_AMBULATORY_CARE_PROVIDER_SITE_OTHER): Payer: BC Managed Care – PPO | Admitting: Family Medicine

## 2020-05-14 DIAGNOSIS — E559 Vitamin D deficiency, unspecified: Secondary | ICD-10-CM | POA: Diagnosis not present

## 2020-05-14 DIAGNOSIS — Z6837 Body mass index (BMI) 37.0-37.9, adult: Secondary | ICD-10-CM

## 2020-05-14 DIAGNOSIS — R7303 Prediabetes: Secondary | ICD-10-CM | POA: Diagnosis not present

## 2020-05-14 MED ORDER — VITAMIN D (ERGOCALCIFEROL) 1.25 MG (50000 UNIT) PO CAPS: ORAL_CAPSULE | ORAL | 0 refills | Status: DC

## 2020-05-14 MED ORDER — RYBELSUS 3 MG PO TABS: ORAL_TABLET | ORAL | 0 refills | Status: DC

## 2020-05-20 NOTE — Progress Notes (Signed)
TeleHealth Visit:  Due to the COVID-19 pandemic, this visit was completed with telemedicine (audio/video) technology to reduce patient and provider exposure as well as to preserve personal protective equipment.   [name] has verbally consented to this TeleHealth visit. The patient is located at home, the provider is located at the [facility] office. The participants in this visit include the listed provider and patient. The visit was conducted today via MyChart Video.   Chief Complaint: OBESITY [name] is here to discuss her progress with her obesity treatment plan along with follow-up of her obesity related diagnoses. [name] is on keeping a food journal and adhering to recommended goals of 1500-1800 calories and 100+ grams of protein daily and states she is following her eating plan approximately 75% of the time. [name] states she is walking for 30 minutes 1 time per week.  Today's visit was #: 13 Starting weight: 240 lbs Starting date: 01/15/2020  Interim History: [name] is struggling with weight loss in the last month. Her scale at home weighs her at 226 lbs which is a 3 lb weight gain. She has been struggling with increased carbohydrate cravings, and she has a upper respiratory infection right now which likely is increasing carbohydrate cravings.  Subjective:   1. Vitamin D deficiency [name] is stable on Vit D, and she requests a refill today.  2. Pre-diabetes [name] is stable on Rybelsus, and she denies nausea or vomiting. She requests a refill. She is working on diet but simple carbohydrates are increased.  Assessment/Plan:   1. Vitamin D deficiency Low Vitamin D level contributes to fatigue and are associated with obesity, breast, and colon cancer. We will refill prescription Vitamin D for 1 month. Ona will follow-up for routine testing of Vitamin D, at least 2-3 times per year to avoid over-replacement.  - Vitamin D, Ergocalciferol, (DRISDOL) 1.25 MG (50000 UNIT)  CAPS capsule; TAKE 1 CAPSULE BY MOUTH EVERY 7 DAYS  Dispense: 4 capsule; Refill: 0  2. Pre-diabetes [name] will continue diet, exercise, decrease simple carbohydrates, increase lean protein and vegetables to help decrease the risk of diabetes. We will refill Rybelsus for 1 month.  - Semaglutide (RYBELSUS) 3 MG TABS; Take 1 tablet by mouth once daily in early morning with no more than 4 oz. Of water  Dispense: 30 tablet; Refill: 0  3. Obesity with current BMI of 36.94 [name] is currently in the action stage of change. As such, her goal is to continue with weight loss efforts. She has agreed to keeping a food journal and adhering to recommended goals of 1500-1800 calories and 100+ grams of protein daily.   [name] is to get back to journaling and increase protein especially in the morning to help decrease cravings. We will continue to monitor.  Exercise goals: All adults should avoid inactivity. Some physical activity is better than none, and adults who participate in any amount of physical activity gain some health benefits.  Behavioral modification strategies: increasing lean protein intake.  [name] has agreed to follow-up with our clinic in 4 weeks. She was informed of the importance of frequent follow-up visits to maximize her success with intensive lifestyle modifications for her multiple health conditions.  Objective:   VITALS: Per patient if applicable, see vitals. GENERAL: Alert and in no acute distress. CARDIOPULMONARY: No increased WOB. Speaking in clear sentences.  PSYCH: Pleasant and cooperative. Speech normal rate and rhythm. Affect is appropriate. Insight and judgement are appropriate. Attention is focused, linear, and appropriate.  NEURO:  Oriented as arrived to appointment on time with no prompting.   Lab Results  Component Value Date   CREATININE 0.63 08/03/2019   BUN 10 08/03/2019   NA 140 08/03/2019   K 4.5 08/03/2019   CL 103 08/03/2019   CO2 23 08/03/2019   Lab Results   Component Value Date   ALT 15 08/03/2019   AST 12 08/03/2019   ALKPHOS 65 08/03/2019   BILITOT 0.2 08/03/2019   Lab Results  Component Value Date   HGBA1C 6.0 (H) 08/03/2019   HGBA1C 6.1 08/13/2017   HGBA1C 5.8 (H) 11/05/2016   Lab Results  Component Value Date   INSULIN 19.2 08/03/2019   INSULIN 12.9 11/05/2016   Lab Results  Component Value Date   TSH 2.19 07/14/2019   Lab Results  Component Value Date   CHOL 151 07/14/2019   HDL 39.30 07/14/2019   LDLCALC 93 07/14/2019   TRIG 94.0 07/14/2019   CHOLHDL 4 07/14/2019   Lab Results  Component Value Date   WBC 9.2 07/14/2019   HGB 13.2 07/14/2019   HCT 39.3 07/14/2019   MCV 93.7 07/14/2019   PLT 399.0 07/14/2019   No results found for: IRON, TIBC, FERRITIN  Attestation Statements:   Reviewed by clinician on day of visit: allergies, medications, problem list, medical history, surgical history, family history, social history, and previous encounter notes.   I, Trixie Dredge, am acting as transcriptionist for Dennard Nip, MD.  I have reviewed the above documentation for accuracy and completeness, and I agree with the above. - Dennard Nip, MD

## 2020-05-26 ENCOUNTER — Encounter: Payer: Self-pay | Admitting: Family Medicine

## 2020-05-26 DIAGNOSIS — M159 Polyosteoarthritis, unspecified: Secondary | ICD-10-CM

## 2020-05-26 DIAGNOSIS — I10 Essential (primary) hypertension: Secondary | ICD-10-CM

## 2020-05-26 DIAGNOSIS — M8949 Other hypertrophic osteoarthropathy, multiple sites: Secondary | ICD-10-CM

## 2020-05-26 MED FILL — Furosemide Tab 20 MG: ORAL | 90 days supply | Qty: 90 | Fill #0 | Status: AC

## 2020-05-26 MED FILL — Potassium Chloride Microencapsulated Crys ER Tab 10 mEq: ORAL | 90 days supply | Qty: 90 | Fill #0 | Status: AC

## 2020-05-26 MED FILL — Amlodipine Besylate Tab 10 MG (Base Equivalent): ORAL | 90 days supply | Qty: 90 | Fill #0 | Status: AC

## 2020-05-27 ENCOUNTER — Other Ambulatory Visit (HOSPITAL_BASED_OUTPATIENT_CLINIC_OR_DEPARTMENT_OTHER): Payer: Self-pay

## 2020-05-27 ENCOUNTER — Other Ambulatory Visit: Payer: Self-pay | Admitting: Family Medicine

## 2020-05-27 DIAGNOSIS — M159 Polyosteoarthritis, unspecified: Secondary | ICD-10-CM

## 2020-05-27 DIAGNOSIS — M8949 Other hypertrophic osteoarthropathy, multiple sites: Secondary | ICD-10-CM

## 2020-05-27 MED ORDER — NEBIVOLOL HCL 5 MG PO TABS: 5.0000 mg | ORAL_TABLET | Freq: Every day | ORAL | 1 refills | Status: DC

## 2020-05-27 MED ORDER — TRAMADOL HCL 50 MG PO TABS: ORAL_TABLET | Freq: Four times a day (QID) | ORAL | 0 refills | Status: DC | PRN

## 2020-05-27 NOTE — Telephone Encounter (Signed)
Requesting: Tramadol Contract: 11/13/2019 UDS: 11/13/2019 Last OV: 01/31/2020 virtual Next OV: 06/07/2020 Last Refill: 02/29/2020, #30--0 RF Database:   Please advise

## 2020-05-28 ENCOUNTER — Other Ambulatory Visit (HOSPITAL_BASED_OUTPATIENT_CLINIC_OR_DEPARTMENT_OTHER): Payer: Self-pay

## 2020-05-29 ENCOUNTER — Other Ambulatory Visit (HOSPITAL_BASED_OUTPATIENT_CLINIC_OR_DEPARTMENT_OTHER): Payer: Self-pay

## 2020-05-29 ENCOUNTER — Other Ambulatory Visit: Payer: Self-pay

## 2020-05-29 ENCOUNTER — Telehealth: Payer: Self-pay | Admitting: Family Medicine

## 2020-05-29 DIAGNOSIS — I10 Essential (primary) hypertension: Secondary | ICD-10-CM

## 2020-05-29 MED ORDER — NEBIVOLOL HCL 5 MG PO TABS: 5.0000 mg | ORAL_TABLET | Freq: Every day | ORAL | 1 refills | Status: DC

## 2020-05-29 NOTE — Telephone Encounter (Signed)
Error noted. Med list updated

## 2020-05-29 NOTE — Telephone Encounter (Signed)
Amber from [facility] [place] states that the nebivolol (BYSTOLIC) 5 MG tablet [[id number] Was discontinues as of 05/27/20   Please advice

## 2020-06-03 ENCOUNTER — Other Ambulatory Visit (INDEPENDENT_AMBULATORY_CARE_PROVIDER_SITE_OTHER): Payer: Self-pay | Admitting: Family Medicine

## 2020-06-03 ENCOUNTER — Other Ambulatory Visit: Payer: Self-pay | Admitting: Family Medicine

## 2020-06-03 ENCOUNTER — Other Ambulatory Visit (HOSPITAL_BASED_OUTPATIENT_CLINIC_OR_DEPARTMENT_OTHER): Payer: Self-pay

## 2020-06-03 ENCOUNTER — Telehealth: Payer: Self-pay | Admitting: Family Medicine

## 2020-06-03 DIAGNOSIS — I10 Essential (primary) hypertension: Secondary | ICD-10-CM

## 2020-06-03 DIAGNOSIS — F3289 Other specified depressive episodes: Secondary | ICD-10-CM

## 2020-06-03 DIAGNOSIS — Z0000 Encounter for general adult medical examination without abnormal findings: Secondary | ICD-10-CM

## 2020-06-03 NOTE — Telephone Encounter (Signed)
Pt last seen by Dr. Beasley.  

## 2020-06-03 NOTE — Telephone Encounter (Signed)
Can we insert labs for CPE? Patient is wanting to come do them early one morning before her appt if possible

## 2020-06-04 ENCOUNTER — Other Ambulatory Visit (HOSPITAL_BASED_OUTPATIENT_CLINIC_OR_DEPARTMENT_OTHER): Payer: Self-pay

## 2020-06-05 NOTE — Telephone Encounter (Signed)
Patient is requesting a refill of the following medications: Requested Prescriptions   Pending Prescriptions Disp Refills   buPROPion (WELLBUTRIN SR) 200 MG 12 hr tablet [Pharmacy Med Name: BUPROPION SR 200MG  TABLETS (12HR)] 30 tablet 0    Sig: TAKE 1 TABLET(200 MG) BY MOUTH DAILY     Last office visit: 05/14/20 Date of last refill: 04/29/20 Last refill amount: 30 Follow up time period per chart:06/11/20

## 2020-06-05 NOTE — Telephone Encounter (Signed)
Please look at the information you gave me and correct it.

## 2020-06-05 NOTE — Telephone Encounter (Signed)
No upcoming office visit.

## 2020-06-05 NOTE — Telephone Encounter (Signed)
Patient do not have an upcoming appt at this office.

## 2020-06-05 NOTE — Telephone Encounter (Signed)
Ntbs to be refilled

## 2020-06-07 ENCOUNTER — Encounter: Payer: BC Managed Care – PPO | Admitting: Family Medicine

## 2020-06-10 ENCOUNTER — Telehealth: Payer: Self-pay | Admitting: Family Medicine

## 2020-06-10 ENCOUNTER — Other Ambulatory Visit: Payer: Self-pay

## 2020-06-10 ENCOUNTER — Other Ambulatory Visit: Payer: Self-pay | Admitting: Family Medicine

## 2020-06-10 DIAGNOSIS — I10 Essential (primary) hypertension: Secondary | ICD-10-CM

## 2020-06-10 DIAGNOSIS — E559 Vitamin D deficiency, unspecified: Secondary | ICD-10-CM

## 2020-06-10 DIAGNOSIS — Z0000 Encounter for general adult medical examination without abnormal findings: Secondary | ICD-10-CM

## 2020-06-10 DIAGNOSIS — R7303 Prediabetes: Secondary | ICD-10-CM

## 2020-06-10 NOTE — Telephone Encounter (Signed)
Called to confirm appt with patient for tomorrow  Patient would like if she could come in tomorrow morning for her labs  Could be insert for her please?

## 2020-06-10 NOTE — Telephone Encounter (Signed)
Appointment is at 4 pm. Faythe Ghee for early lab appointment?

## 2020-06-10 NOTE — Telephone Encounter (Signed)
Dr Etter Sjogren placed the orders. I tried giving the pt a call back to schedule her appt for in the morning, got no answer, mailbox was full.

## 2020-06-10 NOTE — Telephone Encounter (Signed)
Lab orders in

## 2020-06-11 ENCOUNTER — Encounter: Payer: Self-pay | Admitting: Family Medicine

## 2020-06-11 ENCOUNTER — Ambulatory Visit (INDEPENDENT_AMBULATORY_CARE_PROVIDER_SITE_OTHER): Payer: BC Managed Care – PPO | Admitting: Family Medicine

## 2020-06-11 VITALS — BP 130/88 | HR 95 | Temp 99.2°F | Resp 18 | Ht 65.0 in | Wt 230.8 lb

## 2020-06-11 DIAGNOSIS — Z0000 Encounter for general adult medical examination without abnormal findings: Secondary | ICD-10-CM

## 2020-06-11 DIAGNOSIS — R7303 Prediabetes: Secondary | ICD-10-CM

## 2020-06-11 DIAGNOSIS — E559 Vitamin D deficiency, unspecified: Secondary | ICD-10-CM | POA: Diagnosis not present

## 2020-06-11 DIAGNOSIS — M170 Bilateral primary osteoarthritis of knee: Secondary | ICD-10-CM

## 2020-06-11 DIAGNOSIS — R739 Hyperglycemia, unspecified: Secondary | ICD-10-CM

## 2020-06-11 DIAGNOSIS — Z1159 Encounter for screening for other viral diseases: Secondary | ICD-10-CM | POA: Diagnosis not present

## 2020-06-11 DIAGNOSIS — I10 Essential (primary) hypertension: Secondary | ICD-10-CM | POA: Diagnosis not present

## 2020-06-11 NOTE — Assessment & Plan Note (Signed)
Surgery this summer

## 2020-06-11 NOTE — Assessment & Plan Note (Signed)
Well controlled, no changes to meds. Encouraged heart healthy diet such as the DASH diet and exercise as tolerated.  °

## 2020-06-11 NOTE — Patient Instructions (Signed)
Preventive Care 84-49 Years Old, Female Preventive care refers to lifestyle choices and visits with your health care provider that can promote health and wellness. This includes:  A yearly physical exam. This is also called an annual wellness visit.  Regular dental and eye exams.  Immunizations.  Screening for certain conditions.  Healthy lifestyle choices, such as: ? Eating a healthy diet. ? Getting regular exercise. ? Not using drugs or products that contain nicotine and tobacco. ? Limiting alcohol use. What can I expect for my preventive care visit? Physical exam Your health care provider will check your:  Height and weight. These may be used to calculate your BMI (body mass index). BMI is a measurement that tells if you are at a healthy weight.  Heart rate and blood pressure.  Body temperature.  Skin for abnormal spots. Counseling Your health care provider may ask you questions about your:  Past medical problems.  Family's medical history.  Alcohol, tobacco, and drug use.  Emotional well-being.  Home life and relationship well-being.  Sexual activity.  Diet, exercise, and sleep habits.  Work and work Statistician.  Access to firearms.  Method of birth control.  Menstrual cycle.  Pregnancy history. What immunizations do I need? Vaccines are usually given at various ages, according to a schedule. Your health care provider will recommend vaccines for you based on your age, medical history, and lifestyle or other factors, such as travel or where you work.   What tests do I need? Blood tests  Lipid and cholesterol levels. These may be checked every 5 years, or more often if you are over 3 years old.  Hepatitis C test.  Hepatitis B test. Screening  Lung cancer screening. You may have this screening every year starting at age 73 if you have a 30-pack-year history of smoking and currently smoke or have quit within the past 15 years.  Colorectal cancer  screening. ? All adults should have this screening starting at age 52 and continuing until age 17. ? Your health care provider may recommend screening at age 49 if you are at increased risk. ? You will have tests every 1-10 years, depending on your results and the type of screening test.  Diabetes screening. ? This is done by checking your blood sugar (glucose) after you have not eaten for a while (fasting). ? You may have this done every 1-3 years.  Mammogram. ? This may be done every 1-2 years. ? Talk with your health care provider about when you should start having regular mammograms. This may depend on whether you have a family history of breast cancer.  BRCA-related cancer screening. This may be done if you have a family history of breast, ovarian, tubal, or peritoneal cancers.  Pelvic exam and Pap test. ? This may be done every 3 years starting at age 10. ? Starting at age 11, this may be done every 5 years if you have a Pap test in combination with an HPV test. Other tests  STD (sexually transmitted disease) testing, if you are at risk.  Bone density scan. This is done to screen for osteoporosis. You may have this scan if you are at high risk for osteoporosis. Talk with your health care provider about your test results, treatment options, and if necessary, the need for more tests. Follow these instructions at home: Eating and drinking  Eat a diet that includes fresh fruits and vegetables, whole grains, lean protein, and low-fat dairy products.  Take vitamin and mineral supplements  as recommended by your health care provider.  Do not drink alcohol if: ? Your health care provider tells you not to drink. ? You are pregnant, may be pregnant, or are planning to become pregnant.  If you drink alcohol: ? Limit how much you have to 0-1 drink a day. ? Be aware of how much alcohol is in your drink. In the [place] one drink equals one 12 oz bottle of beer (355 mL), one 5 oz glass of  wine (148 mL), or one 1 oz glass of hard liquor (44 mL).   Lifestyle  Take daily care of your teeth and gums. Brush your teeth every morning and night with fluoride toothpaste. Floss one time each day.  Stay active. Exercise for at least 30 minutes 5 or more days each week.  Do not use any products that contain nicotine or tobacco, such as cigarettes, e-cigarettes, and chewing tobacco. If you need help quitting, ask your health care provider.  Do not use drugs.  If you are sexually active, practice safe sex. Use a condom or other form of protection to prevent STIs (sexually transmitted infections).  If you do not wish to become pregnant, use a form of birth control. If you plan to become pregnant, see your health care provider for a prepregnancy visit.  If told by your health care provider, take low-dose aspirin daily starting at age 50.  Find healthy ways to cope with stress, such as: ? Meditation, yoga, or listening to music. ? Journaling. ? Talking to a trusted person. ? Spending time with friends and family. Safety  Always wear your seat belt while driving or riding in a vehicle.  Do not drive: ? If you have been drinking alcohol. Do not ride with someone who has been drinking. ? When you are tired or distracted. ? While texting.  Wear a helmet and other protective equipment during sports activities.  If you have firearms in your house, make sure you follow all gun safety procedures. What's next?  Visit your health care provider once a year for an annual wellness visit.  Ask your health care provider how often you should have your eyes and teeth checked.  Stay up to date on all vaccines. This information is not intended to replace advice given to you by your health care provider. Make sure you discuss any questions you have with your health care provider. Document Revised: 10/17/2019 Document Reviewed: 09/23/2017 Elsevier Patient Education  2021 [employer]  

## 2020-06-11 NOTE — Progress Notes (Signed)
Subjective:   By signing my name below, I, Shehryar Baig, attest that this documentation has been prepared under the direction and in the presence of Dr. Roma Schanz, DO. 06/11/2020      Patient ID: [name], female    DOB: 1970/11/12, 50 y.o.   MRN: [id number]  Chief Complaint  Patient presents with  . Annual Exam    HPI Patient is in today for a comprehensive physical exam. She is complaining of bilateral knee pain. She alternates between 50 mg tramadol PO and tylenol to manage her pain and finds mild relief. She has a upcoming knee replacement surgery planned for both of her knees on different dates. She is requesting a form excusing her from activities that involve standing for long periods at work. She denies having any fever, ear pain, congestion, sinus pain, skin moles, sore throat, eye pain, chest pain, palpations, cough, shortness of breath, wheezing, nausea, vomiting, diarrhea, constipation, blood in stool, dysuria, frequency, hematuria, dizziness, or headaches at this time. She has an upcoming gynecologist appointment with Dr. Servando Salina. During her last blood work she was diagnosed as pre-diabetic. She has had no recent changes in her family history. She is UTD on vision care and dental care.   Past Medical History:  Diagnosis Date  . Anemia   . Anxiety   . Arthritis   . Depression   . Fatigue   . Fibroids    uterine  . Hypertension   . Insulin resistance   . Joint pain   . Lower extremity edema   . Menorrhagia   . Pre-diabetes   . Umbilical hernia   . Vitamin D deficiency     Past Surgical History:  Procedure Laterality Date  . CESAREAN SECTION    . CRYOTHERAPY  1989  . HYSTEROSCOPY WITH D & C  06-20-2008  . [place] ABLATION  06-20-2008  . ROBOTIC ASSISTED TOTAL HYSTERECTOMY N/A 08/30/2014   Procedure: ROBOTIC ASSISTED ATTEMPTED, THEN CONVERSION TO OPEN TOTAL ABDOMINAL HYSTERECTOMY WITH BILATERAL SALPINGECTOMY;  Surgeon: Servando Salina,  MD;  Location: [facility];  Service: Gynecology;  Laterality: N/A;  . TMJ ARTHROPLASTY  1998   has screws in both side of jaw per pt  . tubal ligation  06-20-2008  . UMBILICAL HERNIA REPAIR N/A 08/30/2014   Procedure: HERNIA REPAIR UMBILICAL ADULT;  Surgeon: Autumn Messing III, MD;  Location: [facility];  Service: General;  Laterality: N/A;    Family History  Problem Relation Age of Onset  . Breast cancer Mother   . Hypertension Mother   . Diabetes Mother   . Cancer Mother        breast  . Hyperlipidemia Mother   . Obesity Mother   . Thyroid disease Mother   . Hypertension Father   . Hyperlipidemia Father   . Anxiety disorder Father   . Cancer Father   . Breast cancer Maternal Aunt   . Cancer Maternal Aunt        beast    Social History   Socioeconomic History  . Marital status: Divorced    Spouse name: Not on file  . Number of children: 2  . Years of education: Not on file  . Highest education level: Not on file  Occupational History  . Occupation: Product manager: [employer]  Tobacco Use  . Smoking status: Never Smoker  . Smokeless tobacco: Never Used  Substance and Sexual Activity  . Alcohol use: No  . Drug use:  No  . Sexual activity: Yes    Birth control/protection: Surgical  Other Topics Concern  . Not on file  Social History Narrative  . Not on file   Social Determinants of Health   Financial Resource Strain: Not on file  Food Insecurity: Not on file  Transportation Needs: Not on file  Physical Activity: Not on file  Stress: Not on file  Social Connections: Not on file  Intimate Partner Violence: Not on file    Outpatient Medications Prior to Visit  Medication Sig Dispense Refill  . amLODipine (NORVASC) 10 MG tablet TAKE 1 TABLET BY MOUTH ONCE DAILY 90 tablet 1  . buPROPion (WELLBUTRIN SR) 200 MG 12 hr tablet TAKE 1 TABLET(200 MG) BY MOUTH DAILY 30 tablet 0  . escitalopram (LEXAPRO) 10 MG tablet Take 1 tablet (10 mg total) by mouth at  bedtime. 90 tablet 1  . furosemide (LASIX) 20 MG tablet TAKE 1 TABLET BY MOUTH ONCE DAILY 90 tablet 1  . LORazepam (ATIVAN) 1 MG tablet Take 1 tablet (1 mg total) by mouth at bedtime. 30 tablet 1  . potassium chloride (KLOR-CON) 10 MEQ tablet Take 1 tablet (10 mEq total) by mouth daily. 90 tablet 1  . potassium chloride (KLOR-CON) 10 MEQ tablet TAKE 1 TABLET BY MOUTH ONCE DAILY 90 tablet 1  . Semaglutide (RYBELSUS) 3 MG TABS Take 1 tablet by mouth once daily in early morning with no more than 4 oz. Of water 30 tablet 0  . traMADol (ULTRAM) 50 MG tablet TAKE 1 TABLET (50 MG TOTAL) BY MOUTH EVERY 6 (SIX) HOURS AS NEEDED. 30 tablet 0  . Vitamin D, Ergocalciferol, (DRISDOL) 1.25 MG (50000 UNIT) CAPS capsule TAKE 1 CAPSULE BY MOUTH EVERY 7 DAYS 4 capsule 0  . amoxicillin-clavulanate (AUGMENTIN) 875-125 MG tablet Take 1 tablet by mouth 2 (two) times daily. (Patient not taking: Reported on 06/11/2020) 20 tablet 0  . fluconazole (DIFLUCAN) 150 MG tablet TAKE 1 TABLET BY MOUTH TODAY, MAY REPEAT IN 3 DAYS AS NEEDED (Patient not taking: Reported on 06/11/2020) 2 tablet 0  . promethazine-dextromethorphan (PROMETHAZINE-DM) 6.25-15 MG/5ML syrup Take 5 mLs by mouth 4 (four) times daily as needed. (Patient not taking: Reported on 06/11/2020) 118 mL 0   No facility-administered medications prior to visit.    Allergies  Allergen Reactions  . Lisinopril Swelling and Other (See Comments)    Cough     Review of Systems  Constitutional: Negative for fever.  HENT: Negative for congestion, ear pain, sinus pain and sore throat.   Eyes: Negative for pain.  Respiratory: Negative for cough, shortness of breath and wheezing.   Cardiovascular: Negative for chest pain and palpitations.  Gastrointestinal: Negative for blood in stool, constipation, diarrhea, nausea and vomiting.  Genitourinary: Negative for dysuria, frequency and hematuria.  Musculoskeletal: Positive for joint pain (Bilateral knee pain).  Skin: Negative  for rash.  Neurological: Negative for dizziness and headaches.       Objective:    Physical Exam Constitutional:      Appearance: Normal appearance.  HENT:     Head: Normocephalic and atraumatic.     Right Ear: Tympanic membrane and external ear normal.     Left Ear: Tympanic membrane and external ear normal.  Eyes:     Extraocular Movements: Extraocular movements intact.     Pupils: Pupils are equal, round, and reactive to light.  Cardiovascular:     Rate and Rhythm: Normal rate and regular rhythm.     Pulses: Normal pulses.  Heart sounds: Normal heart sounds. No murmur heard. No friction rub. No gallop.   Pulmonary:     Effort: Pulmonary effort is normal. No respiratory distress.     Breath sounds: Normal breath sounds. No stridor. No wheezing, rhonchi or rales.  Chest:    Abdominal:     General: Bowel sounds are normal. There is no distension.     Palpations: Abdomen is soft. There is no mass.     Tenderness: There is no abdominal tenderness. There is no guarding or rebound.     Hernia: No hernia is present.  Genitourinary:   Lymphadenopathy:     Cervical: No cervical adenopathy.  Skin:    General: Skin is warm and dry.  Neurological:     Mental Status: She is alert and oriented to person, place, and time.  Psychiatric:        Behavior: Behavior normal.     BP 130/88 (BP Location: Right Arm, Patient Position: Sitting, Cuff Size: Large)   Pulse 95   Temp 99.2 F (37.3 C) (Oral)   Resp 18   Ht 5\' 5"  (1.651 m)   Wt 230 lb 12.8 oz (104.7 kg)   LMP 08/17/2014 (Exact Date)   SpO2 96%   BMI 38.41 kg/m  Wt Readings from Last 3 Encounters:  06/11/20 230 lb 12.8 oz (104.7 kg)  03/26/20 222 lb (100.7 kg)  03/12/20 223 lb (101.2 kg)    Diabetic Foot Exam - Simple   No data filed    Lab Results  Component Value Date   WBC 9.2 07/14/2019   HGB 13.2 07/14/2019   HCT 39.3 07/14/2019   PLT 399.0 07/14/2019   GLUCOSE 96 08/03/2019   CHOL 151 07/14/2019    TRIG 94.0 07/14/2019   HDL 39.30 07/14/2019   LDLCALC 93 07/14/2019   ALT 15 08/03/2019   AST 12 08/03/2019   NA 140 08/03/2019   K 4.5 08/03/2019   CL 103 08/03/2019   CREATININE 0.63 08/03/2019   BUN 10 08/03/2019   CO2 23 08/03/2019   TSH 2.19 07/14/2019   HGBA1C 6.0 (H) 08/03/2019   MICROALBUR 0.6 05/01/2016    Lab Results  Component Value Date   TSH 2.19 07/14/2019   Lab Results  Component Value Date   WBC 9.2 07/14/2019   HGB 13.2 07/14/2019   HCT 39.3 07/14/2019   MCV 93.7 07/14/2019   PLT 399.0 07/14/2019   Lab Results  Component Value Date   NA 140 08/03/2019   K 4.5 08/03/2019   CO2 23 08/03/2019   GLUCOSE 96 08/03/2019   BUN 10 08/03/2019   CREATININE 0.63 08/03/2019   BILITOT 0.2 08/03/2019   ALKPHOS 65 08/03/2019   AST 12 08/03/2019   ALT 15 08/03/2019   PROT 6.7 08/03/2019   ALBUMIN 4.1 08/03/2019   CALCIUM 9.2 08/03/2019   ANIONGAP 7 08/31/2014   GFR 117.54 07/14/2019   Lab Results  Component Value Date   CHOL 151 07/14/2019   Lab Results  Component Value Date   HDL 39.30 07/14/2019   Lab Results  Component Value Date   LDLCALC 93 07/14/2019   Lab Results  Component Value Date   TRIG 94.0 07/14/2019   Lab Results  Component Value Date   CHOLHDL 4 07/14/2019   Lab Results  Component Value Date   HGBA1C 6.0 (H) 08/03/2019   Mammogram- Last completed 01/04/2020. Results normal. Repeat in 1 year. Dexa- Not yet completed. Pap Smear- Last completed 05/27/2014. Results normal.  Colonoscopy- She is due for a colonoscopy.    Assessment & Plan:   Problem List Items Addressed This Visit      Unprioritized   Degenerative arthritis of knee, bilateral    Surgery this summer      Prediabetes    Check labs today Watch simple sugars and starches      Preventative health care - Primary    ghm utd Pt will schedule colon after she recuperates from knee surgeries       Primary hypertension    Well controlled, no changes to  meds. Encouraged heart healthy diet such as the DASH diet and exercise as tolerated.       Vitamin D deficiency    Other Visit Diagnoses    Need for hepatitis C screening test       Relevant Orders   Hepatitis C antibody   Hyperglycemia       Relevant Orders   Hemoglobin A1c   Morbid obesity ([place])           No orders of the defined types were placed in this encounter.   I, Dr. Roma Schanz, DO., personally preformed the services described in this documentation.  All medical record entries made by the scribe were at my direction and in my presence.  I have reviewed the chart and discharge instructions (if applicable) and agree that the record reflects my personal performance and is accurate and complete. 06/11/2020   I,Shehryar Baig,acting as a scribe for Ann Held, DO.,have documented all relevant documentation on the behalf of Ann Held, DO,as directed by  Ann Held, DO while in the presence of Ann Held, DO.   Ann Held, DO

## 2020-06-11 NOTE — Assessment & Plan Note (Signed)
Check labs today Watch simple sugars and starches  

## 2020-06-11 NOTE — Assessment & Plan Note (Signed)
ghm utd Pt will schedule colon after she recuperates from knee surgeries

## 2020-06-12 LAB — HEPATITIS C ANTIBODY
Hepatitis C Ab: NONREACTIVE
SIGNAL TO CUT-OFF: 0.01 (ref <1.00)

## 2020-06-12 LAB — LIPID PANEL
Cholesterol: 195 mg/dL (ref 0–200)
HDL: 47.8 mg/dL (ref >39.00)
LDL Cholesterol: 120 mg/dL — ABNORMAL HIGH (ref 0–99)
NonHDL: 147.27
Total CHOL/HDL Ratio: 4
Triglycerides: 136 mg/dL (ref 0.0–149.0)
VLDL: 27.2 mg/dL (ref 0.0–40.0)

## 2020-06-12 LAB — COMPREHENSIVE METABOLIC PANEL
ALT: 12 U/L (ref 0–35)
AST: 12 U/L (ref 0–37)
Albumin: 4.3 g/dL (ref 3.5–5.2)
Alkaline Phosphatase: 55 U/L (ref 39–117)
BUN: 17 mg/dL (ref 6–23)
CO2: 26 mEq/L (ref 19–32)
Calcium: 9.5 mg/dL (ref 8.4–10.5)
Chloride: 104 mEq/L (ref 96–112)
Creatinine, Ser: 0.74 mg/dL (ref 0.40–1.20)
GFR: 95.1 mL/min (ref >60.00)
Glucose, Bld: 92 mg/dL (ref 70–99)
Potassium: 4.1 mEq/L (ref 3.5–5.1)
Sodium: 139 mEq/L (ref 135–145)
Total Bilirubin: 0.2 mg/dL (ref 0.2–1.2)
Total Protein: 7.2 g/dL (ref 6.0–8.3)

## 2020-06-12 LAB — MICROALBUMIN / CREATININE URINE RATIO
Creatinine,U: 87.6 mg/dL
Microalb Creat Ratio: 0.8 mg/g (ref 0.0–30.0)
Microalb, Ur: <0.7 mg/dL (ref 0.0–1.9)

## 2020-06-12 LAB — CBC WITH DIFFERENTIAL/PLATELET
Basophils Absolute: 0.1 10*3/uL (ref 0.0–0.1)
Basophils Relative: 0.8 % (ref 0.0–3.0)
Eosinophils Absolute: 0.1 10*3/uL (ref 0.0–0.7)
Eosinophils Relative: 1.3 % (ref 0.0–5.0)
HCT: 39.7 % (ref 36.0–46.0)
Hemoglobin: 13.2 g/dL (ref 12.0–15.0)
Lymphocytes Relative: 45.9 % (ref 12.0–46.0)
Lymphs Abs: 4 10*3/uL (ref 0.7–4.0)
MCHC: 33.3 g/dL (ref 30.0–36.0)
MCV: 93.8 fl (ref 78.0–100.0)
Monocytes Absolute: 0.5 10*3/uL (ref 0.1–1.0)
Monocytes Relative: 5.6 % (ref 3.0–12.0)
Neutro Abs: 4 10*3/uL (ref 1.4–7.7)
Neutrophils Relative %: 46.4 % (ref 43.0–77.0)
Platelets: 401 10*3/uL — ABNORMAL HIGH (ref 150.0–400.0)
RBC: 4.24 Mil/uL (ref 3.87–5.11)
RDW: 14.1 % (ref 11.5–15.5)
WBC: 8.7 10*3/uL (ref 4.0–10.5)

## 2020-06-12 LAB — HEMOGLOBIN A1C: Hgb A1c MFr Bld: 6.1 % (ref 4.6–6.5)

## 2020-06-12 LAB — VITAMIN D 25 HYDROXY (VIT D DEFICIENCY, FRACTURES): VITD: 37.41 ng/mL (ref 30.00–100.00)

## 2020-06-12 LAB — TSH: TSH: 1.9 u[IU]/mL (ref 0.35–4.50)

## 2020-06-12 LAB — INSULIN, RANDOM: Insulin: 20.5 u[IU]/mL — ABNORMAL HIGH

## 2020-06-25 ENCOUNTER — Other Ambulatory Visit (INDEPENDENT_AMBULATORY_CARE_PROVIDER_SITE_OTHER): Payer: Self-pay | Admitting: Family Medicine

## 2020-06-25 DIAGNOSIS — E559 Vitamin D deficiency, unspecified: Secondary | ICD-10-CM

## 2020-06-26 NOTE — Telephone Encounter (Signed)
Patient is requesting a refill of the following medications: Requested Prescriptions   Pending Prescriptions Disp Refills   Vitamin D, Ergocalciferol, (DRISDOL) 1.25 MG (50000 UNIT) CAPS capsule [Pharmacy Med Name: VITAMIN D2 50,000IU (ERGO) CAP RX] 12 capsule     Sig: TAKE 1 CAPSULE BY MOUTH EVERY 7 DAYS     Last office visit: 05/14/20 Date of last refill: 05/14/20 Last refill amount: 4 Follow up time period per chart: 4 week Next scheduled App: none scheduled

## 2020-06-26 NOTE — Telephone Encounter (Signed)
NTBS.

## 2020-07-10 ENCOUNTER — Other Ambulatory Visit (HOSPITAL_BASED_OUTPATIENT_CLINIC_OR_DEPARTMENT_OTHER): Payer: Self-pay

## 2020-07-10 ENCOUNTER — Other Ambulatory Visit: Payer: Self-pay | Admitting: Family Medicine

## 2020-07-10 DIAGNOSIS — F418 Other specified anxiety disorders: Secondary | ICD-10-CM

## 2020-07-10 DIAGNOSIS — M8949 Other hypertrophic osteoarthropathy, multiple sites: Secondary | ICD-10-CM

## 2020-07-10 DIAGNOSIS — M159 Polyosteoarthritis, unspecified: Secondary | ICD-10-CM

## 2020-07-10 NOTE — Telephone Encounter (Signed)
Patient is requesting a refill of the following medications: Requested Prescriptions   Pending Prescriptions Disp Refills   escitalopram (LEXAPRO) 10 MG tablet 90 tablet 1    Sig: TAKE 1 TABLET (10 MG TOTAL) BY MOUTH AT BEDTIME.   traMADol (ULTRAM) 50 MG tablet 30 tablet 0    Sig: TAKE 1 TABLET (50 MG TOTAL) BY MOUTH EVERY 6 (SIX) HOURS AS NEEDED.   LEXAPRO Date of patient request: 07/10/20 Last office visit: 06/11/20 Date of last refill: 01/12/20 Last refill amount: 90 + 1 Follow up time period per chart: 12/12/20  TRAMADOL Date of patient request: 07/10/20 Last office visit: 06/11/20 Date of last refill: 05/27/20 Last refill amount: 30 + 0 Follow up time period per chart: 12/12/20

## 2020-07-11 ENCOUNTER — Telehealth: Payer: Self-pay | Admitting: Family Medicine

## 2020-07-11 NOTE — Telephone Encounter (Signed)
Form placed in your folder. Pt is scheduled to come in in the AM, but does she need to? She has labs done with her CPE last month, form does not ask for an EKG. Please advise.

## 2020-07-11 NOTE — Telephone Encounter (Signed)
Patient would like to know if you received a medical clearance

## 2020-07-12 ENCOUNTER — Encounter: Payer: Self-pay | Admitting: Family Medicine

## 2020-07-12 ENCOUNTER — Other Ambulatory Visit: Payer: Self-pay

## 2020-07-12 ENCOUNTER — Ambulatory Visit: Payer: BC Managed Care – PPO | Admitting: Family Medicine

## 2020-07-12 ENCOUNTER — Other Ambulatory Visit (HOSPITAL_BASED_OUTPATIENT_CLINIC_OR_DEPARTMENT_OTHER): Payer: Self-pay

## 2020-07-12 VITALS — BP 142/84 | HR 82 | Temp 99.3°F | Resp 18 | Ht 66.0 in | Wt 229.4 lb

## 2020-07-12 DIAGNOSIS — Z01818 Encounter for other preprocedural examination: Secondary | ICD-10-CM | POA: Insufficient documentation

## 2020-07-12 DIAGNOSIS — F418 Other specified anxiety disorders: Secondary | ICD-10-CM

## 2020-07-12 DIAGNOSIS — M170 Bilateral primary osteoarthritis of knee: Secondary | ICD-10-CM

## 2020-07-12 DIAGNOSIS — M150 Primary generalized (osteo)arthritis: Secondary | ICD-10-CM | POA: Insufficient documentation

## 2020-07-12 DIAGNOSIS — I10 Essential (primary) hypertension: Secondary | ICD-10-CM | POA: Diagnosis not present

## 2020-07-12 DIAGNOSIS — M8949 Other hypertrophic osteoarthropathy, multiple sites: Secondary | ICD-10-CM

## 2020-07-12 DIAGNOSIS — M159 Polyosteoarthritis, unspecified: Secondary | ICD-10-CM

## 2020-07-12 MED ORDER — ESCITALOPRAM OXALATE 10 MG PO TABS: 10.0000 mg | ORAL_TABLET | Freq: Every day | ORAL | 1 refills | Status: DC

## 2020-07-12 MED ORDER — TRAMADOL HCL 50 MG PO TABS
ORAL_TABLET | Freq: Four times a day (QID) | ORAL | 0 refills | Status: DC | PRN
  Filled 2020-07-12: qty 30, 8d supply, fill #0

## 2020-07-12 MED ORDER — TRAMADOL HCL 50 MG PO TABS: ORAL_TABLET | Freq: Four times a day (QID) | ORAL | 0 refills | Status: DC | PRN

## 2020-07-12 MED ORDER — ESCITALOPRAM OXALATE 10 MG PO TABS
10.0000 mg | ORAL_TABLET | Freq: Every day | ORAL | 1 refills | Status: DC
  Filled 2020-07-12: qty 90, 90d supply, fill #0
  Filled 2020-10-08: qty 90, 90d supply, fill #1

## 2020-07-12 NOTE — Progress Notes (Addendum)
Patient ID: [name], female    DOB: 1971-01-21  Age: 50 y.o. MRN: [id number]    Subjective:  Subjective  HPI [name] presents for an office visit today. She reports feeling well and has no complaints. Pt is requesting for medical clearance for a R total knee arthroplasty. She endorses taking 10 mg of Lexapro PO Daily for her dx of GAD and 50 mg of tramadol  PO Daily for her dx of degenerative arthritis of knee. She denies any chest pain, SOB, fever, abdominal pain, cough, chills, sore throat, dysuria, urinary incontinence, back pain, HA, or N/V/D at this time.  We recently saw her in f/u --- no changes since   Review of Systems  Constitutional:  Negative for appetite change, chills, diaphoresis, fatigue, fever and unexpected weight change.  HENT:  Negative for ear pain, rhinorrhea, sinus pressure, sinus pain, sore throat and tinnitus.   Eyes:  Negative for pain, redness and visual disturbance.  Respiratory:  Negative for cough, chest tightness, shortness of breath and wheezing.   Cardiovascular:  Negative for chest pain, palpitations and leg swelling.  Gastrointestinal:  Negative for abdominal pain, anal bleeding, constipation, diarrhea, nausea and vomiting.  Endocrine: Negative for cold intolerance, heat intolerance, polydipsia, polyphagia and polyuria.  Genitourinary:  Negative for difficulty urinating, dysuria, flank pain and frequency.  Musculoskeletal:  Positive for arthralgias and joint swelling. Negative for back pain and neck pain.  Skin:  Negative for rash.  Neurological:  Negative for dizziness, seizures, weakness, light-headedness, numbness and headaches.   History Past Medical History:  Diagnosis Date   Anemia    Anxiety    Arthritis    Depression    Fatigue    Fibroids    uterine   Hypertension    Insulin resistance    Joint pain    Lower extremity edema    Menorrhagia    Pre-diabetes    Umbilical hernia    Vitamin D deficiency     She has a past  surgical history that includes [place] ablation (06-20-2008); Hysteroscopy with D & C (06-20-2008); tubal ligation (06-20-2008); Cryotherapy (1989); Cesarean section; TMJ Arthroplasty (1998); Robotic assisted total hysterectomy (N/A, 01/29/4313); and Umbilical hernia repair (N/A, 08/30/2014).   Her family history includes Anxiety disorder in her father; Breast cancer in her maternal aunt and mother; Cancer in her father, maternal aunt, and mother; Diabetes in her mother; Hyperlipidemia in her father and mother; Hypertension in her father and mother; Obesity in her mother; Thyroid disease in her mother.She reports that she has never smoked. She has never used smokeless tobacco. She reports that she does not drink alcohol and does not use drugs.  Current Outpatient Medications on File Prior to Visit  Medication Sig Dispense Refill   amLODipine (NORVASC) 10 MG tablet TAKE 1 TABLET BY MOUTH ONCE DAILY 90 tablet 1   buPROPion (WELLBUTRIN SR) 200 MG 12 hr tablet TAKE 1 TABLET(200 MG) BY MOUTH DAILY 30 tablet 0   furosemide (LASIX) 20 MG tablet TAKE 1 TABLET BY MOUTH ONCE DAILY 90 tablet 1   LORazepam (ATIVAN) 1 MG tablet Take 1 tablet (1 mg total) by mouth at bedtime. 30 tablet 1   potassium chloride (KLOR-CON) 10 MEQ tablet Take 1 tablet (10 mEq total) by mouth daily. 90 tablet 1   potassium chloride (KLOR-CON) 10 MEQ tablet TAKE 1 TABLET BY MOUTH ONCE DAILY 90 tablet 1   Semaglutide (RYBELSUS) 3 MG TABS Take 1 tablet by mouth once daily in early  morning with no more than 4 oz. Of water 30 tablet 0   Vitamin D, Ergocalciferol, (DRISDOL) 1.25 MG (50000 UNIT) CAPS capsule TAKE 1 CAPSULE BY MOUTH EVERY 7 DAYS 4 capsule 0   No current facility-administered medications on file prior to visit.     Objective:  Objective  Physical Exam Vitals and nursing note reviewed.  Constitutional:      Appearance: Normal appearance. She is well-developed.  HENT:     Head: Normocephalic and atraumatic.     Right Ear:  External ear normal.     Left Ear: External ear normal.  Eyes:     Extraocular Movements: Extraocular movements intact.     Conjunctiva/sclera: Conjunctivae normal.     Pupils: Pupils are equal, round, and reactive to light.  Neck:     Thyroid: No thyromegaly.     Vascular: No carotid bruit or JVD.  Cardiovascular:     Rate and Rhythm: Normal rate and regular rhythm.     Heart sounds: Normal heart sounds. No murmur heard. Pulmonary:     Effort: Pulmonary effort is normal. No respiratory distress.     Breath sounds: Normal breath sounds. No wheezing or rales.  Chest:     Chest wall: No tenderness.  Musculoskeletal:        General: Swelling, tenderness and deformity present.     Cervical back: Normal range of motion and neck supple.     Comments: B/l knee pain  Neurological:     General: No focal deficit present.     Mental Status: She is alert and oriented to person, place, and time. Mental status is at baseline.  Psychiatric:        Behavior: Behavior normal.        Thought Content: Thought content normal.   BP (!) 142/84 (BP Location: Left Arm, Patient Position: Sitting, Cuff Size: Large)   Pulse 82   Temp 99.3 F (37.4 C) (Oral)   Resp 18   Ht 5\' 6"  (1.676 m)   Wt 229 lb 6.4 oz (104.1 kg)   LMP 08/17/2014 (Exact Date)   SpO2 97%   BMI 37.03 kg/m  Wt Readings from Last 3 Encounters:  07/12/20 229 lb 6.4 oz (104.1 kg)  06/11/20 230 lb 12.8 oz (104.7 kg)  03/26/20 222 lb (100.7 kg)     Lab Results  Component Value Date   WBC 8.7 06/11/2020   HGB 13.2 06/11/2020   HCT 39.7 06/11/2020   PLT 401.0 (H) 06/11/2020   GLUCOSE 92 06/11/2020   CHOL 195 06/11/2020   TRIG 136.0 06/11/2020   HDL 47.80 06/11/2020   LDLCALC 120 (H) 06/11/2020   ALT 12 06/11/2020   AST 12 06/11/2020   NA 139 06/11/2020   K 4.1 06/11/2020   CL 104 06/11/2020   CREATININE 0.74 06/11/2020   BUN 17 06/11/2020   CO2 26 06/11/2020   TSH 1.90 06/11/2020   HGBA1C 6.1 06/11/2020   MICROALBUR  <0.7 06/11/2020   EKG--- no change from 7/ 2021 DG Knee Complete 4 Views Left  Result Date: 04/08/2018 CLINICAL DATA:  Chronic knee pain EXAM: LEFT KNEE - COMPLETE 4+ VIEW COMPARISON:  None. FINDINGS: Moderate narrowing of the medial femorotibial joint space. Small suprapatellar effusion. Minimal medial osteophyte formation. Patellofemoral joint space is preserved. No acute fracture or dislocation. 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 Electronically Signed   By: Ulyses Jarred M.D.   On: 04/08/2018 13:57   DG Knee Complete 4  Views Right  Result Date: 04/08/2018 CLINICAL DATA:  Right knee pain EXAM: RIGHT KNEE - COMPLETE 4+ VIEW COMPARISON:  None. FINDINGS: There is narrowing of the medial femorotibial joint space with medial osteophyte formation. Lateral femorotibial joint space is preserved. There is a small suprapatellar knee effusion. The patellofemoral space is normal. No acute fracture or dislocation IMPRESSION: Moderate medial compartment osteoarthrosis of the right knee with small suprapatellar effusion. Electronically Signed   By: Ulyses Jarred M.D.   On: 04/08/2018 13:55     Assessment & Plan:  Plan   Meds ordered this encounter  Medications   DISCONTD: escitalopram (LEXAPRO) 10 MG tablet    Sig: Take 1 tablet (10 mg total) by mouth at bedtime.    Dispense:  90 tablet    Refill:  1   DISCONTD: traMADol (ULTRAM) 50 MG tablet    Sig: TAKE 1 TABLET (50 MG TOTAL) BY MOUTH EVERY 6 (SIX) HOURS AS NEEDED.    Dispense:  30 tablet    Refill:  0   escitalopram (LEXAPRO) 10 MG tablet    Sig: Take 1 tablet (10 mg total) by mouth at bedtime.    Dispense:  90 tablet    Refill:  1   traMADol (ULTRAM) 50 MG tablet    Sig: TAKE 1 TABLET (50 MG TOTAL) BY MOUTH EVERY 6 (SIX) HOURS AS NEEDED.    Dispense:  30 tablet    Refill:  0    Problem List Items Addressed This Visit       Unprioritized   Degenerative arthritis of knee, bilateral    Pt cleared for  b/l knee replacement        Relevant Medications   traMADol (ULTRAM) 50 MG tablet   Depression with anxiety    Stable  Cont meds        Relevant Medications   escitalopram (LEXAPRO) 10 MG tablet   Preop examination - Primary    Pt cleared for surgery  Low risk        Relevant Orders   EKG 12-Lead (Completed)   Primary hypertension    Well controlled, no changes to meds. Encouraged heart healthy diet such as the DASH diet and exercise as tolerated.        Primary osteoarthritis involving multiple joints   Relevant Medications   traMADol (ULTRAM) 50 MG tablet    Follow-up: No follow-ups on file.   I,Gordon Zheng,acting as a Education administrator for [employer], DO.,have documented all relevant documentation on the behalf of Ann Held, DO,as directed by  Ann Held, DO while in the presence of [place], DO, have reviewed all documentation for this visit. The documentation on 07/12/20 for the exam, diagnosis, procedures, and orders are all accurate and complete.

## 2020-07-12 NOTE — Assessment & Plan Note (Signed)
Well controlled, no changes to meds. Encouraged heart healthy diet such as the DASH diet and exercise as tolerated.  °

## 2020-07-12 NOTE — Assessment & Plan Note (Signed)
Pt cleared for surgery  Low risk

## 2020-07-12 NOTE — Patient Instructions (Signed)
Preparing for Surgery Preparing for surgery is an important part of your care. It can make things go more smoothly and help you avoid complications. The steps leading up to surgery may vary among hospitals. Follow all instructions given to you by your health care providers. Ask questions if you do not understand something. Talk aboutany concerns that you have. Here are some questions to consider asking before your surgery: If my surgery is not an emergency (is elective), when would be the best time to have the surgery? What arrangements do I need to make for work, home, or school? What will my recovery be like? How long will it be before I can return to normal activities? Will I need to prepare my home? Will I need to arrange care for me or my children? Should I expect to have pain after surgery? What are my pain management options? Are there nonmedical options that I can try for pain? Tell a health care provider about: Any allergies you have. All medicines you are taking, including vitamins, herbs, eye drops, creams, and over-the-counter medicines. Any problems you or family members have had with anesthetic medicines. Any blood disorders you have. Any surgeries you have had. Any medical conditions you have. Whether you are pregnant or may be pregnant. What are the risks? The risks and complications of surgery depend on the specific procedure that you have. Discuss all the risks with your health care providers before your surgery. Ask about common surgical complications, which may include: Infection. Bleeding or a need for blood replacement (transfusion). Allergic reactions to medicines. Damage to surrounding nerves, tissues, or structures. A blood clot. Scarring. Failure of the surgery to correct the problem. Follow these instructions before the procedure: Several days or weeks before your procedure  You may have a physical exam by your primary health care provider to make sure it is  safe for you to have surgery. You may have testing. This may include a chest X-ray, blood and urine tests, electrocardiogram (ECG), or other testing. Ask your health care provider about: Changing or stopping your regular medicines. This is especially important if you are taking diabetes medicines or blood thinners. Taking medicines such as aspirin and ibuprofen. These medicines can thin your blood. Do not take these medicines unless your health care provider tells you to take them. Taking over-the-counter medicines, vitamins, herbs, and supplements. Do not use any products that contain nicotine or tobacco, such as cigarettes and e-cigarettes. If you need help quitting, ask your health care provider. Avoid alcohol, or limit your alcohol intake to no more than 1 drink a day for nonpregnant women and 2 drinks a day for men. One drink equals 12 oz of beer, 5 oz of wine, or 1 oz of hard liquor. Ask your health care provider if there are exercises you can do to prepare for surgery. Eat a healthy diet. A healthy diet includes low-fat dairy products, low-fat (lean) meats, and fiber from whole grains, beans, and lots of fruits and vegetables. Plan to have a responsible adult take you home from the hospital or clinic. Plan to have a responsible adult care for you for the time you are told after you leave the hospital or clinic. This is important.  The day before your procedure You may be given antibiotic medicine to take by mouth to help prevent infection. Take it as told by your health care provider. You may be asked to shower with a germ-killing soap. Follow instructions from your health care provider  about eating and drinking restrictions. The day of your procedure You may need to take another shower with a germ-killing soap before you leave home in the morning. With a small sip of water, take only the medicines that you are told to take. Do not wear any makeup, nail polish, powder, deodorant, lotion,  jewelry, hair accessories, or anything on your skin or body except your clothes. If you will be staying in the hospital, bring a case to hold your glasses, contacts, or dentures. You may also want to bring your robe and non-skid footwear. If instructed by your health care provider, bring your sleep apnea device with you on the day of your surgery (if this applies to you). Arrive at the hospital as scheduled. Bring a friend or family member with you who can help to answer questions and be present while you meet with your health care provider. At the hospital When you arrive at the hospital, you will likely: Go to the reception desk to notify staff that you have arrived. Move to the preoperative and preparation areas before going to the operating room. You may have to wear compression stockings. These help to prevent blood clots and reduce swelling in your legs. An IV may be inserted into one of your veins. In the operating room, you may be given one or more of the following: A medicine to help you relax (sedative). A medicine to numb the area (local anesthetic). A medicine to make you fall asleep (general anesthetic). A medicine that is injected into an area of your body to numb everything below the injection site (regional anesthetic). Your surgical site will be marked or identified. You may be given an antibiotic through your IV to help prevent infection. Contact a health care provider if you: Develop a fever of more than 100.47F (38C) or other feelings of illness during the 48 hours before your surgery. Have symptoms that get worse. Have questions or concerns about your surgery. Summary Preparing for surgery can make the procedure go more smoothly and lower your risk of complications. Before surgery, make a list of questions and concerns to discuss with your surgeon. Ask about the risks and possible complications. In the days or weeks before your surgery, follow all instructions from your  health care provider. You may need to stop smoking, avoid alcohol, follow eating restrictions, and change or stop your regular medicines. Contact your surgeon if you develop a fever or other signs of illness during the few days before your surgery. This information is not intended to replace advice given to you by your health care provider. Make sure you discuss any questions you have with your healthcare provider. Document Revised: 09/20/2019 Document Reviewed: 09/21/2019 Elsevier Patient Education  [employer].

## 2020-07-12 NOTE — Assessment & Plan Note (Signed)
Pt cleared for b/l knee replacement

## 2020-07-12 NOTE — Assessment & Plan Note (Signed)
Stable Cont meds 

## 2020-07-15 ENCOUNTER — Other Ambulatory Visit (HOSPITAL_BASED_OUTPATIENT_CLINIC_OR_DEPARTMENT_OTHER): Payer: Self-pay

## 2020-07-17 ENCOUNTER — Encounter (INDEPENDENT_AMBULATORY_CARE_PROVIDER_SITE_OTHER): Payer: Self-pay | Admitting: Family Medicine

## 2020-07-17 ENCOUNTER — Other Ambulatory Visit: Payer: Self-pay

## 2020-07-17 ENCOUNTER — Ambulatory Visit (INDEPENDENT_AMBULATORY_CARE_PROVIDER_SITE_OTHER): Payer: BC Managed Care – PPO | Admitting: Family Medicine

## 2020-07-17 ENCOUNTER — Other Ambulatory Visit (HOSPITAL_BASED_OUTPATIENT_CLINIC_OR_DEPARTMENT_OTHER): Payer: Self-pay

## 2020-07-17 VITALS — BP 135/87 | HR 86 | Temp 98.2°F | Ht 66.0 in | Wt 227.0 lb

## 2020-07-17 DIAGNOSIS — Z9189 Other specified personal risk factors, not elsewhere classified: Secondary | ICD-10-CM

## 2020-07-17 DIAGNOSIS — E559 Vitamin D deficiency, unspecified: Secondary | ICD-10-CM

## 2020-07-17 DIAGNOSIS — F3289 Other specified depressive episodes: Secondary | ICD-10-CM

## 2020-07-17 DIAGNOSIS — R7303 Prediabetes: Secondary | ICD-10-CM | POA: Diagnosis not present

## 2020-07-17 DIAGNOSIS — Z6837 Body mass index (BMI) 37.0-37.9, adult: Secondary | ICD-10-CM

## 2020-07-17 MED ORDER — RYBELSUS 3 MG PO TABS
ORAL_TABLET | ORAL | 0 refills | Status: DC
Start: 2020-07-17 — End: 2020-11-05
  Filled 2020-07-17 – 2020-10-08 (×2): qty 30, 30d supply, fill #0

## 2020-07-17 MED ORDER — BUPROPION HCL ER (SR) 200 MG PO TB12
ORAL_TABLET | ORAL | 0 refills | Status: DC
  Filled 2020-07-17 – 2020-10-08 (×2): qty 30, 30d supply, fill #0

## 2020-07-17 MED ORDER — VITAMIN D (ERGOCALCIFEROL) 1.25 MG (50000 UNIT) PO CAPS
ORAL_CAPSULE | ORAL | 0 refills | Status: DC
  Filled 2020-07-17 – 2020-10-08 (×2): qty 4, 28d supply, fill #0

## 2020-07-22 NOTE — Progress Notes (Signed)
Chief Complaint:   OBESITY [name] is here to discuss her progress with her obesity treatment plan along with follow-up of her obesity related diagnoses. [name] is on keeping a food journal and adhering to recommended goals of 1500-1800 calories and 100+ grams of protein daily and states she is following her eating plan approximately 0% of the time. [name] states she is doing 0 minutes 0 times per week.  Today's visit was #: 14 Starting weight: 240 lbs Starting date: 01/15/2020 Today's weight: 227 lbs Today's date: 07/17/2020 Total lbs lost to date: 13 Total lbs lost since last in-office visit: 0  Interim History: [name]'s last visit in office was approximately 3 months ago. She has gotten a bit off track and she is ready to start back in the right direction.  Subjective:   1. Pre-diabetes [name]'s recent A1c was elevated at 6.1. She has struggled with diet, but she is ready to get back on track.  2. Vitamin D deficiency [name]'s Vit D likely low, and she is not on Vit D currently.  3. Other depression with emotional eating [name] is ready to restart Wellbutrin. She is struggling with emotional eating and feeling guilty about weight gain.  4. At risk for dehydration [name] is at risk for dehydration due to heat.  Assessment/Plan:   1. Pre-diabetes [name] agreed to restart Rybelsus 3 mg q AM and we will refill for 1 month. She will continue to work on weight loss, exercise, and decreasing simple carbohydrates to help decrease the risk of diabetes.   - Semaglutide (RYBELSUS) 3 MG TABS; Take 1 tablet by mouth once daily in early morning with no more than 4 oz. Of water  Dispense: 30 tablet; Refill: 0  2. Vitamin D deficiency Low Vitamin D level contributes to fatigue and are associated with obesity, breast, and colon cancer. [name] agreed to restart prescription Vitamin D 50,000 IU every week, and we will refill for 1 month. She will follow-up for routine testing of Vitamin D, at least  2-3 times per year to avoid over-replacement.  - Vitamin D, Ergocalciferol, (DRISDOL) 1.25 MG (50000 UNIT) CAPS capsule; TAKE 1 CAPSULE BY MOUTH EVERY 7 DAYS  Dispense: 4 capsule; Refill: 0  3. Other depression with emotional eating Behavior modification techniques were discussed today to help [name] deal with her emotional/non-hunger eating behaviors. [name] agreed to restart Wellbutrin SR 200 mg q daily and we will refill for 1 month. Orders and follow up as documented in patient record.   - buPROPion (WELLBUTRIN SR) 200 MG 12 hr tablet; TAKE 1 TABLET(200 MG) BY MOUTH DAILY  Dispense: 30 tablet; Refill: 0  4. At risk for dehydration [name] was given approximately 15 minutes dehydration prevention counseling today. [name] is at risk for dehydration due to weight loss and current medication(s). She was encouraged to hydrate and monitor fluid status to avoid dehydration as well as weight loss plateaus.   5. Obesity with current BMI 36.7 [name] is currently in the action stage of change. As such, her goal is to continue with weight loss efforts. She has agreed to following a lower carbohydrate, vegetable and lean protein rich diet plan.   Behavioral modification strategies: increasing water intake and meal planning and cooking strategies.  [name] has agreed to follow-up with our clinic in 3 weeks. She was informed of the importance of frequent follow-up visits to maximize her success with intensive lifestyle modifications for her multiple health conditions.   Objective:   Blood pressure 135/87,  pulse 86, temperature 98.2 F (36.8 C), height 5\' 6"  (1.676 m), weight 227 lb (103 kg), last menstrual period 08/17/2014, SpO2 99 %. Body mass index is 36.64 kg/m.  General: Cooperative, alert, well developed, in no acute distress. HEENT: Conjunctivae and lids unremarkable. Cardiovascular: Regular rhythm.  Lungs: Normal work of breathing. Neurologic: No focal deficits.   Lab Results  Component Value  Date   CREATININE 0.74 06/11/2020   BUN 17 06/11/2020   NA 139 06/11/2020   K 4.1 06/11/2020   CL 104 06/11/2020   CO2 26 06/11/2020   Lab Results  Component Value Date   ALT 12 06/11/2020   AST 12 06/11/2020   ALKPHOS 55 06/11/2020   BILITOT 0.2 06/11/2020   Lab Results  Component Value Date   HGBA1C 6.1 06/11/2020   HGBA1C 6.0 (H) 08/03/2019   HGBA1C 6.1 08/13/2017   HGBA1C 5.8 (H) 11/05/2016   Lab Results  Component Value Date   INSULIN 19.2 08/03/2019   INSULIN 12.9 11/05/2016   Lab Results  Component Value Date   TSH 1.90 06/11/2020   Lab Results  Component Value Date   CHOL 195 06/11/2020   HDL 47.80 06/11/2020   LDLCALC 120 (H) 06/11/2020   TRIG 136.0 06/11/2020   CHOLHDL 4 06/11/2020   Lab Results  Component Value Date   WBC 8.7 06/11/2020   HGB 13.2 06/11/2020   HCT 39.7 06/11/2020   MCV 93.8 06/11/2020   PLT 401.0 (H) 06/11/2020   No results found for: IRON, TIBC, FERRITIN  Attestation Statements:   Reviewed by clinician on day of visit: allergies, medications, problem list, medical history, surgical history, family history, social history, and previous encounter notes.   I, Trixie Dredge, am acting as transcriptionist for Dennard Nip, MD.  I have reviewed the above documentation for accuracy and completeness, and I agree with the above. -  Dennard Nip, MD

## 2020-07-26 ENCOUNTER — Other Ambulatory Visit (HOSPITAL_BASED_OUTPATIENT_CLINIC_OR_DEPARTMENT_OTHER): Payer: Self-pay

## 2020-08-05 ENCOUNTER — Ambulatory Visit (INDEPENDENT_AMBULATORY_CARE_PROVIDER_SITE_OTHER): Payer: BC Managed Care – PPO | Admitting: Family Medicine

## 2020-10-08 ENCOUNTER — Other Ambulatory Visit: Payer: Self-pay | Admitting: Family Medicine

## 2020-10-08 ENCOUNTER — Other Ambulatory Visit (HOSPITAL_BASED_OUTPATIENT_CLINIC_OR_DEPARTMENT_OTHER): Payer: Self-pay

## 2020-10-08 DIAGNOSIS — I10 Essential (primary) hypertension: Secondary | ICD-10-CM

## 2020-10-08 DIAGNOSIS — M159 Polyosteoarthritis, unspecified: Secondary | ICD-10-CM

## 2020-10-08 DIAGNOSIS — M8949 Other hypertrophic osteoarthropathy, multiple sites: Secondary | ICD-10-CM

## 2020-10-09 ENCOUNTER — Other Ambulatory Visit (HOSPITAL_BASED_OUTPATIENT_CLINIC_OR_DEPARTMENT_OTHER): Payer: Self-pay

## 2020-10-09 MED ORDER — TRAMADOL HCL 50 MG PO TABS
ORAL_TABLET | Freq: Four times a day (QID) | ORAL | 0 refills | Status: DC | PRN
  Filled 2020-10-09: qty 30, 7d supply, fill #0

## 2020-10-09 MED ORDER — AMLODIPINE BESYLATE 10 MG PO TABS
ORAL_TABLET | Freq: Every day | ORAL | 1 refills | Status: DC
Start: 2020-10-09 — End: 2021-07-23
  Filled 2020-10-09: qty 90, 90d supply, fill #0
  Filled 2021-04-07: qty 90, 90d supply, fill #1

## 2020-10-09 MED ORDER — NEBIVOLOL HCL 5 MG PO TABS
ORAL_TABLET | Freq: Every day | ORAL | 1 refills | Status: DC
Start: 2020-10-09 — End: 2021-07-23
  Filled 2020-10-09: qty 90, 90d supply, fill #0
  Filled 2021-04-07: qty 90, 90d supply, fill #1

## 2020-10-09 MED ORDER — POTASSIUM CHLORIDE CRYS ER 10 MEQ PO TBCR
EXTENDED_RELEASE_TABLET | Freq: Every day | ORAL | 1 refills | Status: DC
  Filled 2020-10-09: qty 90, 90d supply, fill #0
  Filled 2021-04-07: qty 90, 90d supply, fill #1

## 2020-10-09 MED ORDER — FUROSEMIDE 20 MG PO TABS
ORAL_TABLET | Freq: Every day | ORAL | 1 refills | Status: DC
  Filled 2020-10-09: qty 90, 90d supply, fill #0
  Filled 2021-04-07: qty 90, 90d supply, fill #1

## 2020-10-09 NOTE — Telephone Encounter (Signed)
Requesting: Tramadol Contract:  11/13/2019 UDS:  11/13/2019 Last OV: 07/12/20 Next OV: 12/12/20 Last Refill: 07/12/20, #30--0 RF Database:   Please advise

## 2020-10-21 ENCOUNTER — Other Ambulatory Visit: Payer: Self-pay

## 2020-10-21 ENCOUNTER — Ambulatory Visit (INDEPENDENT_AMBULATORY_CARE_PROVIDER_SITE_OTHER): Payer: BC Managed Care – PPO | Admitting: Family Medicine

## 2020-10-21 ENCOUNTER — Encounter (INDEPENDENT_AMBULATORY_CARE_PROVIDER_SITE_OTHER): Payer: Self-pay | Admitting: Family Medicine

## 2020-10-21 VITALS — BP 149/89 | HR 91 | Temp 98.8°F | Ht 66.0 in | Wt 238.0 lb

## 2020-10-21 DIAGNOSIS — Z6837 Body mass index (BMI) 37.0-37.9, adult: Secondary | ICD-10-CM

## 2020-10-21 DIAGNOSIS — R7303 Prediabetes: Secondary | ICD-10-CM

## 2020-10-21 DIAGNOSIS — I10 Essential (primary) hypertension: Secondary | ICD-10-CM

## 2020-10-21 DIAGNOSIS — Z9189 Other specified personal risk factors, not elsewhere classified: Secondary | ICD-10-CM

## 2020-10-21 NOTE — Progress Notes (Signed)
Chief Complaint:   OBESITY [name] is here to discuss her progress with her obesity treatment plan along with follow-up of her obesity related diagnoses. Makilah is on following a lower carbohydrate, vegetable and lean protein rich diet plan and states she is following her eating plan approximately 0% of the time. [name] states she is doing 0 minutes 0 times per week.  Today's visit was #: 15 Starting weight: 240 lbs Starting date: 01/15/2020 Today's weight: 238 lbs Today's date: 10/21/2020 Total lbs lost to date: 2 Total lbs lost since last in-office visit: 0  Interim History: [name]'s last visit was approximately 3 months ago. She has had a lot going on in her life including knee surgery. She hasn't been able to concentrate on weight loss during this time. She has gained weight, but she is ready to get back on track.  Subjective:   1. Pre-diabetes [name] was off her medications while having surgery. She just restarted last week.  2. Essential hypertension [name]'s blood pressure is elevated today. It may be due to feeling anxious about her weight gain.  3. At risk for heart disease [name] is at a higher than average risk for cardiovascular disease due to obesity.   Assessment/Plan:   1. Pre-diabetes [name] will continue her Rybelsus at 3 mg, and she may increase to 7 mg at her next visit. She will work on weight loss, exercise, and decreasing simple carbohydrates to help decrease the risk of diabetes.   2. Essential hypertension [name] will restart her diet, exercise, and weight loss to improve blood pressure control. She will watch for signs of hypotension as she continues her lifestyle modifications.  3. At risk for heart disease [name] was given approximately 15 minutes of coronary artery disease prevention counseling today. She is 50 y.o. female and has risk factors for heart disease including obesity. We discussed intensive lifestyle modifications today with an emphasis on  specific weight loss instructions and strategies.   Repetitive spaced learning was employed today to elicit superior memory formation and behavioral change.  4. Obesity with current BMI 38.4 [name] is currently in the action stage of change. As such, her goal is to continue with weight loss efforts. She has agreed to keeping a food journal and adhering to recommended goals of 1500-1600 calories and 85+ grams of protein daily.   Exercise goals: Physical therapy for exercise.  Behavioral modification strategies: decreasing simple carbohydrates and meal planning and cooking strategies.  [name] has agreed to follow-up with our clinic in 2 to 3 weeks. She was informed of the importance of frequent follow-up visits to maximize her success with intensive lifestyle modifications for her multiple health conditions.   Objective:   Blood pressure (!) 149/89, pulse 91, temperature 98.8 F (37.1 C), height 5\' 6"  (1.676 m), weight 238 lb (108 kg), last menstrual period 08/17/2014, SpO2 98 %. Body mass index is 38.41 kg/m.  General: Cooperative, alert, well developed, in no acute distress. HEENT: Conjunctivae and lids unremarkable. Cardiovascular: Regular rhythm.  Lungs: Normal work of breathing. Neurologic: No focal deficits.   Lab Results  Component Value Date   CREATININE 0.74 06/11/2020   BUN 17 06/11/2020   NA 139 06/11/2020   K 4.1 06/11/2020   CL 104 06/11/2020   CO2 26 06/11/2020   Lab Results  Component Value Date   ALT 12 06/11/2020   AST 12 06/11/2020   ALKPHOS 55 06/11/2020   BILITOT 0.2 06/11/2020   Lab Results  Component Value  Date   HGBA1C 6.1 06/11/2020   HGBA1C 6.0 (H) 08/03/2019   HGBA1C 6.1 08/13/2017   HGBA1C 5.8 (H) 11/05/2016   Lab Results  Component Value Date   INSULIN 19.2 08/03/2019   INSULIN 12.9 11/05/2016   Lab Results  Component Value Date   TSH 1.90 06/11/2020   Lab Results  Component Value Date   CHOL 195 06/11/2020   HDL 47.80 06/11/2020    LDLCALC 120 (H) 06/11/2020   TRIG 136.0 06/11/2020   CHOLHDL 4 06/11/2020   Lab Results  Component Value Date   VD25OH 37.41 06/11/2020   VD25OH 18.3 (L) 08/03/2019   VD25OH 14.7 (L) 11/05/2016   Lab Results  Component Value Date   WBC 8.7 06/11/2020   HGB 13.2 06/11/2020   HCT 39.7 06/11/2020   MCV 93.8 06/11/2020   PLT 401.0 (H) 06/11/2020   No results found for: IRON, TIBC, FERRITIN  Attestation Statements:   Reviewed by clinician on day of visit: allergies, medications, problem list, medical history, surgical history, family history, social history, and previous encounter notes.   I, Trixie Dredge, am acting as transcriptionist for Dennard Nip, MD.  I have reviewed the above documentation for accuracy and completeness, and I agree with the above. -  Dennard Nip, MD

## 2020-10-31 ENCOUNTER — Other Ambulatory Visit (HOSPITAL_BASED_OUTPATIENT_CLINIC_OR_DEPARTMENT_OTHER): Payer: Self-pay

## 2020-10-31 MED ORDER — METRONIDAZOLE 500 MG PO TABS
500.0000 mg | ORAL_TABLET | Freq: Two times a day (BID) | ORAL | 0 refills | Status: DC
  Filled 2020-10-31: qty 14, 7d supply, fill #0

## 2020-11-05 ENCOUNTER — Ambulatory Visit (INDEPENDENT_AMBULATORY_CARE_PROVIDER_SITE_OTHER): Payer: BC Managed Care – PPO | Admitting: Family Medicine

## 2020-11-05 ENCOUNTER — Encounter (INDEPENDENT_AMBULATORY_CARE_PROVIDER_SITE_OTHER): Payer: Self-pay | Admitting: Family Medicine

## 2020-11-05 ENCOUNTER — Other Ambulatory Visit (HOSPITAL_BASED_OUTPATIENT_CLINIC_OR_DEPARTMENT_OTHER): Payer: Self-pay

## 2020-11-05 ENCOUNTER — Other Ambulatory Visit: Payer: Self-pay

## 2020-11-05 VITALS — BP 136/90 | HR 90 | Temp 98.6°F | Ht 66.0 in | Wt 234.0 lb

## 2020-11-05 DIAGNOSIS — Z9189 Other specified personal risk factors, not elsewhere classified: Secondary | ICD-10-CM

## 2020-11-05 DIAGNOSIS — E559 Vitamin D deficiency, unspecified: Secondary | ICD-10-CM | POA: Diagnosis not present

## 2020-11-05 DIAGNOSIS — I10 Essential (primary) hypertension: Secondary | ICD-10-CM | POA: Diagnosis not present

## 2020-11-05 DIAGNOSIS — R7303 Prediabetes: Secondary | ICD-10-CM | POA: Diagnosis not present

## 2020-11-05 DIAGNOSIS — Z6837 Body mass index (BMI) 37.0-37.9, adult: Secondary | ICD-10-CM

## 2020-11-05 MED ORDER — RYBELSUS 7 MG PO TABS
7.0000 mg | ORAL_TABLET | Freq: Every day | ORAL | 0 refills | Status: DC
  Filled 2020-11-05: qty 30, 30d supply, fill #0

## 2020-11-05 MED ORDER — VITAMIN D (ERGOCALCIFEROL) 1.25 MG (50000 UNIT) PO CAPS
ORAL_CAPSULE | ORAL | 0 refills | Status: DC
Start: 2020-11-05 — End: 2021-01-29
  Filled 2020-11-05: qty 4, 28d supply, fill #0

## 2020-11-06 NOTE — Progress Notes (Signed)
Chief Complaint:   OBESITY [name] is here to discuss her progress with her obesity treatment plan along with follow-up of her obesity related diagnoses. [name] is on keeping a food journal and adhering to recommended goals of 1500-1600 calories and 85+ grams of protein daily and states she is following her eating plan approximately 85% of the time. [name] states she is doing 0 minutes 0 times per week.  Today's visit was #: 16 Starting weight: 240 lbs Starting date: 01/15/2020 Today's weight: 234 lbs Today's date: 11/05/2020 Total lbs lost to date: 6 Total lbs lost since last in-office visit: 4  Interim History: [name] continues to do well with weight loss. She is working on increasing her protein and she is trying to meal plan.  Subjective:   1. Pre-diabetes [name] is stable on Rybelsus, but she still struggles with polyphagia.  2. Vitamin D deficiency [name] is on Vit D, and she denies nausea or vomiting. She requests a refill.  3. Essential hypertension [name]'s blood pressure is normally stable on Norvasc, but it was slightly elevated today.  4. At risk for diabetes mellitus [name] is at higher than average risk for developing diabetes due to obesity.   Assessment/Plan:   1. Pre-diabetes [name] agreed to increase Rybelsus to 7 mg q daily, and we will refill for 1 month. She will continue to work on weight loss, exercise, and decreasing simple carbohydrates to help decrease the risk of diabetes.   - Semaglutide (RYBELSUS) 7 MG TABS; Take 1 tablet by mouth daily.  Dispense: 30 tablet; Refill: 0  2. Vitamin D deficiency Low Vitamin D level contributes to fatigue and are associated with obesity, breast, and colon cancer. We will refill prescription Vitamin D for 1 month. [name] will follow-up for routine testing of Vitamin D, at least 2-3 times per year to avoid over-replacement.  - Vitamin D, Ergocalciferol, (DRISDOL) 1.25 MG (50000 UNIT) CAPS capsule; TAKE 1 CAPSULE BY MOUTH  EVERY 7 DAYS  Dispense: 4 capsule; Refill: 0  3. Essential hypertension [name] will continue with diet and exercise to improve blood pressure control. We will recheck her blood pressure in 2 to 3 weeks as she continues her lifestyle modifications.  4. At risk for diabetes mellitus [name] was given approximately 15 minutes of diabetes education and counseling today. We discussed intensive lifestyle modifications today with an emphasis on weight loss as well as increasing exercise and decreasing simple carbohydrates in her diet. We also reviewed medication options with an emphasis on risk versus benefit of those discussed.   Repetitive spaced learning was employed today to elicit superior memory formation and behavioral change.  5. Obesity with current BMI 37.8 [name] is currently in the action stage of change. As such, her goal is to continue with weight loss efforts. She has agreed to keeping a food journal and adhering to recommended goals of 1500-1600 calories and 85+ grams of protein daily.   Fall higher protein soup recipes were given.  Behavioral modification strategies: meal planning and cooking strategies.  [name] has agreed to follow-up with our clinic in 2 to 3 weeks. She was informed of the importance of frequent follow-up visits to maximize her success with intensive lifestyle modifications for her multiple health conditions.   Objective:   Blood pressure 136/90, pulse 90, temperature 98.6 F (37 C), height 5\' 6"  (1.676 m), weight 234 lb (106.1 kg), last menstrual period 08/17/2014, SpO2 99 %. Body mass index is 37.77 kg/m.  General: Cooperative, alert, well  developed, in no acute distress. HEENT: Conjunctivae and lids unremarkable. Cardiovascular: Regular rhythm.  Lungs: Normal work of breathing. Neurologic: No focal deficits.   Lab Results  Component Value Date   CREATININE 0.74 06/11/2020   BUN 17 06/11/2020   NA 139 06/11/2020   K 4.1 06/11/2020   CL 104 06/11/2020    CO2 26 06/11/2020   Lab Results  Component Value Date   ALT 12 06/11/2020   AST 12 06/11/2020   ALKPHOS 55 06/11/2020   BILITOT 0.2 06/11/2020   Lab Results  Component Value Date   HGBA1C 6.1 06/11/2020   HGBA1C 6.0 (H) 08/03/2019   HGBA1C 6.1 08/13/2017   HGBA1C 5.8 (H) 11/05/2016   Lab Results  Component Value Date   INSULIN 19.2 08/03/2019   INSULIN 12.9 11/05/2016   Lab Results  Component Value Date   TSH 1.90 06/11/2020   Lab Results  Component Value Date   CHOL 195 06/11/2020   HDL 47.80 06/11/2020   LDLCALC 120 (H) 06/11/2020   TRIG 136.0 06/11/2020   CHOLHDL 4 06/11/2020   Lab Results  Component Value Date   VD25OH 37.41 06/11/2020   VD25OH 18.3 (L) 08/03/2019   VD25OH 14.7 (L) 11/05/2016   Lab Results  Component Value Date   WBC 8.7 06/11/2020   HGB 13.2 06/11/2020   HCT 39.7 06/11/2020   MCV 93.8 06/11/2020   PLT 401.0 (H) 06/11/2020   No results found for: IRON, TIBC, FERRITIN  Attestation Statements:   Reviewed by clinician on day of visit: allergies, medications, problem list, medical history, surgical history, family history, social history, and previous encounter notes.   I, Trixie Dredge, am acting as transcriptionist for Dennard Nip, MD.  I have reviewed the above documentation for accuracy and completeness, and I agree with the above. -  Dennard Nip, MD

## 2020-11-12 ENCOUNTER — Other Ambulatory Visit (HOSPITAL_BASED_OUTPATIENT_CLINIC_OR_DEPARTMENT_OTHER): Payer: Self-pay

## 2020-11-12 MED ORDER — AMOXICILLIN 500 MG PO CAPS
2000.0000 mg | ORAL_CAPSULE | ORAL | 0 refills | Status: DC
  Filled 2020-11-12: qty 8, 2d supply, fill #0

## 2020-11-20 ENCOUNTER — Ambulatory Visit (INDEPENDENT_AMBULATORY_CARE_PROVIDER_SITE_OTHER): Payer: BC Managed Care – PPO | Admitting: Family Medicine

## 2020-11-20 ENCOUNTER — Other Ambulatory Visit (HOSPITAL_BASED_OUTPATIENT_CLINIC_OR_DEPARTMENT_OTHER): Payer: Self-pay

## 2020-11-25 ENCOUNTER — Telehealth: Payer: Self-pay | Admitting: Family Medicine

## 2020-11-25 NOTE — Telephone Encounter (Signed)
[facility] called and stated that pt needed surgical clearance and labs done before 11/10. Please advise

## 2020-11-25 NOTE — Telephone Encounter (Signed)
LM with surgery coordinator, Bethena Roys [phone]) requesting call back.  Patient had pre-op appointment in June 2022.

## 2020-11-26 ENCOUNTER — Ambulatory Visit: Payer: BC Managed Care – PPO | Admitting: Family Medicine

## 2020-12-02 ENCOUNTER — Ambulatory Visit (INDEPENDENT_AMBULATORY_CARE_PROVIDER_SITE_OTHER): Payer: BC Managed Care – PPO | Admitting: Family Medicine

## 2020-12-03 ENCOUNTER — Encounter: Payer: Self-pay | Admitting: Family Medicine

## 2020-12-03 ENCOUNTER — Ambulatory Visit: Payer: BC Managed Care – PPO | Admitting: Family Medicine

## 2020-12-03 ENCOUNTER — Other Ambulatory Visit: Payer: Self-pay

## 2020-12-03 VITALS — BP 132/82 | HR 93 | Temp 99.2°F | Resp 18 | Ht 65.0 in | Wt 242.2 lb

## 2020-12-03 DIAGNOSIS — Z01818 Encounter for other preprocedural examination: Secondary | ICD-10-CM

## 2020-12-03 DIAGNOSIS — E559 Vitamin D deficiency, unspecified: Secondary | ICD-10-CM | POA: Diagnosis not present

## 2020-12-03 NOTE — Progress Notes (Signed)
Subjective:   By signing my name below, I, Shehryar Baig, attest that this documentation has been prepared under the direction and in the presence of Dr. Roma Schanz, DO. 12/03/2020    Patient ID: [name], female    DOB: [birth year], 50 y.o.   MRN: [id number]  Chief Complaint  Patient presents with   Medical Clearance    Left Knee replacement     HPI Patient is in today for surgical clearance.   Left knee replacement- She is planning on getting left knee replacement.  Right Knee replacement- She completed a right knee replacement procedure and reports recovering well from it. She reports vomiting after being on anesthesia at home. She reports this is a typical reaction. She did not inform her surgeon or anesthesiologist of her reaction to anesthesia. She was in physical therapy following her procedure and reports doing well while attending. Hydrocodone- She was taking hydrocodone to manage her pain post-surgery and found she was having hallucinations while taking it.  Weight- She continues seeing a healthy weight and wellness program.    Past Medical History:  Diagnosis Date   Anemia    Anxiety    Arthritis    Depression    Fatigue    Fibroids    uterine   Hypertension    Insulin resistance    Joint pain    Lower extremity edema    Menorrhagia    Pre-diabetes    Umbilical hernia    Vitamin D deficiency     Past Surgical History:  Procedure Laterality Date   CESAREAN SECTION     CRYOTHERAPY  1989   HYSTEROSCOPY WITH D & C  06-20-2008   NOVASURE ABLATION  06-20-2008   ROBOTIC ASSISTED TOTAL HYSTERECTOMY N/A 08/30/2014   Procedure: ROBOTIC ASSISTED ATTEMPTED, THEN CONVERSION TO OPEN TOTAL ABDOMINAL HYSTERECTOMY WITH BILATERAL SALPINGECTOMY;  Surgeon: Servando Salina, MD;  Location: [facility];  Service: Gynecology;  Laterality: N/A;   TMJ ARTHROPLASTY  1998   has screws in both side of jaw per pt   tubal ligation  [id number]   UMBILICAL HERNIA REPAIR N/A  08/30/2014   Procedure: HERNIA REPAIR UMBILICAL ADULT;  Surgeon: Autumn Messing III, MD;  Location: [facility];  Service: General;  Laterality: N/A;    Family History  Problem Relation Age of Onset   Breast cancer Mother    Hypertension Mother    Diabetes Mother    Cancer Mother        breast   Hyperlipidemia Mother    Obesity Mother    Thyroid disease Mother    Hypertension Father    Hyperlipidemia Father    Anxiety disorder Father    Cancer Father    Breast cancer Maternal Aunt    Cancer Maternal Aunt        beast    Social History   Socioeconomic History   Marital status: Divorced    Spouse name: Not on file   Number of children: 2   Years of education: Not on file   Highest education level: Not on file  Occupational History   Occupation: Teacher    Employer: [employer]  Tobacco Use   Smoking status: Never   Smokeless tobacco: Never  Substance and Sexual Activity   Alcohol use: No   Drug use: No   Sexual activity: Yes    Birth control/protection: Surgical  Other Topics Concern   Not on file  Social History Narrative   Not on file  Social Determinants of Health   Financial Resource Strain: Not on file  Food Insecurity: Not on file  Transportation Needs: Not on file  Physical Activity: Not on file  Stress: Not on file  Social Connections: Not on file  Intimate Partner Violence: Not on file    Outpatient Medications Prior to Visit  Medication Sig Dispense Refill   amLODipine (NORVASC) 10 MG tablet TAKE 1 TABLET BY MOUTH ONCE DAILY 90 tablet 1   buPROPion (WELLBUTRIN SR) 200 MG 12 hr tablet TAKE 1 TABLET(200 MG) BY MOUTH DAILY 30 tablet 0   escitalopram (LEXAPRO) 10 MG tablet Take 1 tablet (10 mg total) by mouth at bedtime. 90 tablet 1   furosemide (LASIX) 20 MG tablet TAKE 1 TABLET BY MOUTH ONCE DAILY 90 tablet 1   LORazepam (ATIVAN) 1 MG tablet Take 1 tablet (1 mg total) by mouth at bedtime. 30 tablet 1   nebivolol (BYSTOLIC) 5 MG tablet TAKE 1  TABLET BY MOUTH ONCE DAILY 90 tablet 1   potassium chloride (KLOR-CON) 10 MEQ tablet Take 1 tablet (10 mEq total) by mouth daily. 90 tablet 1   potassium chloride (KLOR-CON) 10 MEQ tablet TAKE 1 TABLET BY MOUTH ONCE DAILY 90 tablet 1   Semaglutide (RYBELSUS) 7 MG TABS Take 1 tablet by mouth daily. 30 tablet 0   traMADol (ULTRAM) 50 MG tablet TAKE 1 TABLET (50 MG TOTAL) BY MOUTH EVERY 6 (SIX) HOURS AS NEEDED. 30 tablet 0   Vitamin D, Ergocalciferol, (DRISDOL) 1.25 MG (50000 UNIT) CAPS capsule TAKE 1 CAPSULE BY MOUTH EVERY 7 DAYS 4 capsule 0   amoxicillin (AMOXIL) 500 MG capsule Take 4 capsules (2,000 mg total) by mouth 1 hour before procedure. (Patient not taking: Reported on 12/03/2020) 8 capsule 0   No facility-administered medications prior to visit.    Allergies  Allergen Reactions   Hydrocodone Other (See Comments)    hallucinations   Lisinopril Swelling and Other (See Comments)    Cough     Review of Systems  Constitutional:  Negative for fever and malaise/fatigue.  HENT:  Negative for congestion.   Eyes:  Negative for blurred vision.  Respiratory:  Negative for shortness of breath.   Cardiovascular:  Negative for chest pain, palpitations and leg swelling.  Gastrointestinal:  Negative for abdominal pain, blood in stool and nausea.  Genitourinary:  Negative for dysuria and frequency.  Musculoskeletal:  Positive for joint pain. Negative for falls.  Skin:  Negative for rash.  Neurological:  Negative for dizziness, loss of consciousness and headaches.  Endo/Heme/Allergies:  Negative for environmental allergies.  Psychiatric/Behavioral:  Negative for depression. The patient is not nervous/anxious.       Objective:    Physical Exam Vitals and nursing note reviewed.  Constitutional:      General: She is not in acute distress.    Appearance: Normal appearance. She is not ill-appearing.  HENT:     Head: Normocephalic and atraumatic.     Right Ear: Tympanic membrane, ear canal  and external ear normal.     Left Ear: Tympanic membrane, ear canal and external ear normal.  Eyes:     Extraocular Movements: Extraocular movements intact.     Pupils: Pupils are equal, round, and reactive to light.  Cardiovascular:     Rate and Rhythm: Normal rate and regular rhythm.     Heart sounds: Normal heart sounds. No murmur heard.   No gallop.  Pulmonary:     Effort: Pulmonary effort is normal. No respiratory  distress.     Breath sounds: Normal breath sounds. No wheezing or rales.  Abdominal:     General: Bowel sounds are normal. There is no distension.     Palpations: Abdomen is soft.     Tenderness: There is no abdominal tenderness. There is no guarding.  Skin:    General: Skin is warm and dry.  Neurological:     Mental Status: She is alert and oriented to person, place, and time.  Psychiatric:        Behavior: Behavior normal.    BP 132/82 (BP Location: Left Arm, Patient Position: Sitting, Cuff Size: Large)   Pulse 93   Temp 99.2 F (37.3 C) (Oral)   Resp 18   Ht 5\' 5"  (1.651 m)   Wt 242 lb 3.2 oz (109.9 kg)   LMP 08/17/2014 (Exact Date)   SpO2 98%   BMI 40.30 kg/m  Wt Readings from Last 3 Encounters:  12/03/20 242 lb 3.2 oz (109.9 kg)  11/05/20 234 lb (106.1 kg)  10/21/20 238 lb (108 kg)    Diabetic Foot Exam - Simple   No data filed    Lab Results  Component Value Date   WBC 8.7 06/11/2020   HGB 13.2 06/11/2020   HCT 39.7 06/11/2020   PLT 401.0 (H) 06/11/2020   GLUCOSE 92 06/11/2020   CHOL 195 06/11/2020   TRIG 136.0 06/11/2020   HDL 47.80 06/11/2020   LDLCALC 120 (H) 06/11/2020   ALT 12 06/11/2020   AST 12 06/11/2020   NA 139 06/11/2020   K 4.1 06/11/2020   CL 104 06/11/2020   CREATININE 0.74 06/11/2020   BUN 17 06/11/2020   CO2 26 06/11/2020   TSH 1.90 06/11/2020   HGBA1C 6.1 06/11/2020   MICROALBUR <0.7 06/11/2020    Lab Results  Component Value Date   TSH 1.90 06/11/2020   Lab Results  Component Value Date   WBC 8.7  06/11/2020   HGB 13.2 06/11/2020   HCT 39.7 06/11/2020   MCV 93.8 06/11/2020   PLT 401.0 (H) 06/11/2020   Lab Results  Component Value Date   NA 139 06/11/2020   K 4.1 06/11/2020   CO2 26 06/11/2020   GLUCOSE 92 06/11/2020   BUN 17 06/11/2020   CREATININE 0.74 06/11/2020   BILITOT 0.2 06/11/2020   ALKPHOS 55 06/11/2020   AST 12 06/11/2020   ALT 12 06/11/2020   PROT 7.2 06/11/2020   ALBUMIN 4.3 06/11/2020   CALCIUM 9.5 06/11/2020   ANIONGAP 7 08/31/2014   GFR 95.10 06/11/2020   Lab Results  Component Value Date   CHOL 195 06/11/2020   Lab Results  Component Value Date   HDL 47.80 06/11/2020   Lab Results  Component Value Date   LDLCALC 120 (H) 06/11/2020   Lab Results  Component Value Date   TRIG 136.0 06/11/2020   Lab Results  Component Value Date   CHOLHDL 4 06/11/2020   Lab Results  Component Value Date   HGBA1C 6.1 06/11/2020       Assessment & Plan:   Problem List Items Addressed This Visit       Unprioritized   Vitamin D deficiency   Relevant Orders   Vitamin D (25 hydroxy)   Pre-operative clearance - Primary    ekg no change from previous ekg 6/17 Check labs  Cleared for L knee arthroplasty       Relevant Orders   EKG 12-Lead (Completed)   CBC with Differential/Platelet   Comprehensive  metabolic panel   Protime-INR   APTT   Other Visit Diagnoses     Morbid obesity ([place])       Relevant Orders   CBC with Differential/Platelet   Comprehensive metabolic panel   Hemoglobin A1c   Lipid panel   Insulin, random        No orders of the defined types were placed in this encounter.   I, Dr. Roma Schanz, DO, personally preformed the services described in this documentation.  All medical record entries made by the scribe were at my direction and in my presence.  I have reviewed the chart and discharge instructions (if applicable) and agree that the record reflects my personal performance and is accurate and complete.  12/03/2020   I,Shehryar Baig,acting as a scribe for Ann Held, DO.,have documented all relevant documentation on the behalf of Ann Held, DO,as directed by  Ann Held, DO while in the presence of Ann Held, DO.   Ann Held, DO

## 2020-12-03 NOTE — Assessment & Plan Note (Signed)
ekg no change from previous ekg 6/17 Check labs  Cleared for L knee arthroplasty

## 2020-12-03 NOTE — Patient Instructions (Signed)
Preparing for Knee Replacement Getting prepared before knee replacement surgery can make recovery easier and more comfortable. This document provides some tips and guidelines that will help you prepare for your surgery. Talk with your health care provider so you can learn what to expect before, during, and after surgery. Ask questions if you do not understand something. Tell a health care provider about: Any allergies you have. All medicines you are taking, including vitamins, herbs, eye drops, creams, and over-the-counter medicines. Any problems you or family members have had with anesthetic medicines. Any blood disorders you have. Any surgeries you have had. Any medical conditions you have. Whether you are pregnant or may be pregnant. What happens before the procedure? Visit your health care providers Keep all appointments before surgery. You will need to have a physical exam before surgery (preoperative exam) to make sure it is safe for you to have knee replacement surgery. You may also need to have more tests. When you go to the exam, bring a list of all the medicines and supplements, including herbs and vitamins, that you take. Have dental care and routine cleanings done before surgery. Germs from anywhere in your body, including your mouth, can travel to your new joint and infect it. Tell your dentist that you plan to have knee replacement surgery. Know the costs of surgery To find out how much the surgery will cost, call your insurance company as soon as you decide to have surgery. Ask questions like: How much of the surgery and hospital stay will be covered? What will be covered for: Medical equipment? Rehabilitation facilities? Home care? Prepare your home Pick a recovery spot that is not your bed. It is better that you sit more upright during recovery. You may want to use a recliner. Place items that you often use on a small table near your recovery spot. These may include the TV  remote, a cordless phone or your mobile phone, a book, a laptop computer, and a water glass. Move other items you will need to shelves and drawers that are at countertop height. Do this in your kitchen, bathroom, and bedroom. You may be given a walker to use at home. Check if you will have enough room to use a walker. Move around your home with your hands out about 6 inches (15 cm) from your sides. You will have enough room if you do not hit anything with your hands as you do this. Walk from: Your recovery spot to your kitchen and bathroom. Your bed to the bathroom. Prepare some meals to freeze and reheat later. Make your home safe for recovery   Remove all clutter and throw rugs from your floors. This will help you avoid tripping. Consider getting safety equipment that will be helpful during your recovery, such as: Grab bars in the shower and near the toilet. A raised toilet seat. This will help you get on and off the toilet more easily. A tub or shower bench. Prepare your body If you smoke, quit as soon as you can before surgery. If there is time, it is best to quit several months before surgery. Tell your surgeon if you use any products that contain nicotine or tobacco. These products include cigarettes, chewing tobacco, and vaping devices, such as e-cigarettes. These can delay healing. If you need help quitting, ask your health care provider. Talk to your health care provider about doing exercises before your surgery. Doing these exercises in the weeks before your surgery may help reduce pain and  improve function after surgery. Be sure to follow the exercise program given by your health care provider. Maintain a healthy diet. Do not change your diet before surgery unless your health care provider tells you to do that. Do not drink any alcohol for at least 48 hours before surgery. Plan your recovery In the first couple of weeks after surgery, it will be harder for you to do some of your  regular activities. You may get tired easily, and you will have limited movement in your leg. To make sure you have all the help you need after your surgery: Plan to have a responsible adult take you home from the hospital. Your health care provider will tell you how many days you can expect to be in the hospital. Cancel all work, caregiving, and volunteer responsibilities for at least 4-6 weeks after surgery. Plan to have a responsible adult stay with you day and night for the first week. This person should be someone you are comfortable with. You may need this person to help you with your exercises and personal care, such as bathing and using the toilet. If you live alone, arrange for someone to take care of your home and pets for the first 4-6 weeks after surgery. Arrange for drivers to take you to follow-up visits, the grocery store, and other places you may need to go for at least 4-6 weeks. Consider applying for a disability parking permit. To get an application, call your local department of motor vehicles [facility]) or your health care provider's office. Summary Getting prepared before knee replacement surgery can make your recovery easier and more comfortable. Keep all visits to your health care provider before surgery. You will have an exam and may have tests to make sure that you are ready for your surgery. Prepare your home and arrange for help at home. Plan to have a responsible adult take you home from the hospital. Also, plan to have a responsible adult stay with you day and night for the first week after you leave the hospital. This information is not intended to replace advice given to you by your health care provider. Make sure you discuss any questions you have with your health care provider. Document Revised: 07/04/2019 Document Reviewed: 07/04/2019 Elsevier Patient Education  [place].

## 2020-12-04 LAB — COMPREHENSIVE METABOLIC PANEL
ALT: 13 U/L (ref 0–35)
AST: 14 U/L (ref 0–37)
Albumin: 4.4 g/dL (ref 3.5–5.2)
Alkaline Phosphatase: 65 U/L (ref 39–117)
BUN: 16 mg/dL (ref 6–23)
CO2: 29 mEq/L (ref 19–32)
Calcium: 9.7 mg/dL (ref 8.4–10.5)
Chloride: 103 mEq/L (ref 96–112)
Creatinine, Ser: 0.77 mg/dL (ref 0.40–1.20)
GFR: 90.37 mL/min (ref >60.00)
Glucose, Bld: 99 mg/dL (ref 70–99)
Potassium: 4.1 mEq/L (ref 3.5–5.1)
Sodium: 140 mEq/L (ref 135–145)
Total Bilirubin: 0.3 mg/dL (ref 0.2–1.2)
Total Protein: 7.1 g/dL (ref 6.0–8.3)

## 2020-12-04 LAB — LIPID PANEL
Cholesterol: 194 mg/dL (ref 0–200)
HDL: 41.1 mg/dL (ref >39.00)
LDL Cholesterol: 115 mg/dL — ABNORMAL HIGH (ref 0–99)
NonHDL: 153.21
Total CHOL/HDL Ratio: 5
Triglycerides: 189 mg/dL — ABNORMAL HIGH (ref 0.0–149.0)
VLDL: 37.8 mg/dL (ref 0.0–40.0)

## 2020-12-04 LAB — PROTIME-INR
INR: 1 ratio (ref 0.8–1.0)
Prothrombin Time: 11.2 s (ref 9.6–13.1)

## 2020-12-04 LAB — CBC WITH DIFFERENTIAL/PLATELET
Basophils Absolute: 0.1 10*3/uL (ref 0.0–0.1)
Basophils Relative: 0.6 % (ref 0.0–3.0)
Eosinophils Absolute: 0.1 10*3/uL (ref 0.0–0.7)
Eosinophils Relative: 1.5 % (ref 0.0–5.0)
HCT: 39.3 % (ref 36.0–46.0)
Hemoglobin: 12.9 g/dL (ref 12.0–15.0)
Lymphocytes Relative: 44 % (ref 12.0–46.0)
Lymphs Abs: 3.6 10*3/uL (ref 0.7–4.0)
MCHC: 32.7 g/dL (ref 30.0–36.0)
MCV: 91.8 fl (ref 78.0–100.0)
Monocytes Absolute: 0.5 10*3/uL (ref 0.1–1.0)
Monocytes Relative: 5.5 % (ref 3.0–12.0)
Neutro Abs: 4 10*3/uL (ref 1.4–7.7)
Neutrophils Relative %: 48.4 % (ref 43.0–77.0)
Platelets: 447 10*3/uL — ABNORMAL HIGH (ref 150.0–400.0)
RBC: 4.28 Mil/uL (ref 3.87–5.11)
RDW: 14.1 % (ref 11.5–15.5)
WBC: 8.2 10*3/uL (ref 4.0–10.5)

## 2020-12-04 LAB — APTT: aPTT: 37 s — ABNORMAL HIGH (ref 23.4–32.7)

## 2020-12-04 LAB — VITAMIN D 25 HYDROXY (VIT D DEFICIENCY, FRACTURES): VITD: 33.92 ng/mL (ref 30.00–100.00)

## 2020-12-04 LAB — HEMOGLOBIN A1C: Hgb A1c MFr Bld: 6.5 % (ref 4.6–6.5)

## 2020-12-04 LAB — INSULIN, RANDOM: Insulin: 52.3 u[IU]/mL — ABNORMAL HIGH

## 2020-12-08 ENCOUNTER — Other Ambulatory Visit: Payer: Self-pay | Admitting: Family Medicine

## 2020-12-08 DIAGNOSIS — E1165 Type 2 diabetes mellitus with hyperglycemia: Secondary | ICD-10-CM

## 2020-12-08 DIAGNOSIS — E1169 Type 2 diabetes mellitus with other specified complication: Secondary | ICD-10-CM

## 2020-12-09 ENCOUNTER — Other Ambulatory Visit: Payer: Self-pay | Admitting: *Deleted

## 2020-12-09 ENCOUNTER — Other Ambulatory Visit (HOSPITAL_BASED_OUTPATIENT_CLINIC_OR_DEPARTMENT_OTHER): Payer: Self-pay

## 2020-12-09 MED ORDER — ROSUVASTATIN CALCIUM 10 MG PO TABS
10.0000 mg | ORAL_TABLET | Freq: Every day | ORAL | 3 refills | Status: DC
  Filled 2020-12-09 – 2021-01-30 (×2): qty 30, 30d supply, fill #0
  Filled 2021-07-23: qty 30, 30d supply, fill #1

## 2020-12-12 ENCOUNTER — Ambulatory Visit: Payer: BC Managed Care – PPO | Admitting: Family Medicine

## 2020-12-17 ENCOUNTER — Other Ambulatory Visit (HOSPITAL_BASED_OUTPATIENT_CLINIC_OR_DEPARTMENT_OTHER): Payer: Self-pay

## 2020-12-18 ENCOUNTER — Other Ambulatory Visit (INDEPENDENT_AMBULATORY_CARE_PROVIDER_SITE_OTHER): Payer: Self-pay | Admitting: Family Medicine

## 2020-12-18 ENCOUNTER — Other Ambulatory Visit (HOSPITAL_BASED_OUTPATIENT_CLINIC_OR_DEPARTMENT_OTHER): Payer: Self-pay

## 2020-12-18 DIAGNOSIS — R7303 Prediabetes: Secondary | ICD-10-CM

## 2021-01-29 ENCOUNTER — Other Ambulatory Visit: Payer: Self-pay

## 2021-01-29 ENCOUNTER — Encounter (INDEPENDENT_AMBULATORY_CARE_PROVIDER_SITE_OTHER): Payer: Self-pay | Admitting: Family Medicine

## 2021-01-29 ENCOUNTER — Ambulatory Visit (INDEPENDENT_AMBULATORY_CARE_PROVIDER_SITE_OTHER): Payer: BC Managed Care – PPO | Admitting: Family Medicine

## 2021-01-29 ENCOUNTER — Other Ambulatory Visit (HOSPITAL_BASED_OUTPATIENT_CLINIC_OR_DEPARTMENT_OTHER): Payer: Self-pay

## 2021-01-29 VITALS — BP 117/78 | HR 95 | Temp 99.5°F | Ht 66.0 in | Wt 239.0 lb

## 2021-01-29 DIAGNOSIS — F418 Other specified anxiety disorders: Secondary | ICD-10-CM

## 2021-01-29 DIAGNOSIS — Z9189 Other specified personal risk factors, not elsewhere classified: Secondary | ICD-10-CM | POA: Diagnosis not present

## 2021-01-29 DIAGNOSIS — Z6837 Body mass index (BMI) 37.0-37.9, adult: Secondary | ICD-10-CM

## 2021-01-29 DIAGNOSIS — E1169 Type 2 diabetes mellitus with other specified complication: Secondary | ICD-10-CM

## 2021-01-29 DIAGNOSIS — E559 Vitamin D deficiency, unspecified: Secondary | ICD-10-CM

## 2021-01-29 DIAGNOSIS — R7303 Prediabetes: Secondary | ICD-10-CM

## 2021-01-29 MED ORDER — OZEMPIC (0.25 OR 0.5 MG/DOSE) 2 MG/1.5ML ~~LOC~~ SOPN
0.2500 mg | PEN_INJECTOR | SUBCUTANEOUS | 0 refills | Status: DC
  Filled 2021-01-29: qty 1.5, 56d supply, fill #0

## 2021-01-29 MED ORDER — BUPROPION HCL ER (SR) 200 MG PO TB12
ORAL_TABLET | ORAL | 1 refills | Status: DC
  Filled 2021-01-29: qty 30, 30d supply, fill #0

## 2021-01-29 MED ORDER — VITAMIN D (ERGOCALCIFEROL) 1.25 MG (50000 UNIT) PO CAPS
ORAL_CAPSULE | ORAL | 0 refills | Status: DC
  Filled 2021-01-29: qty 4, 28d supply, fill #0

## 2021-01-30 ENCOUNTER — Encounter (INDEPENDENT_AMBULATORY_CARE_PROVIDER_SITE_OTHER): Payer: Self-pay | Admitting: Family Medicine

## 2021-01-30 ENCOUNTER — Other Ambulatory Visit (HOSPITAL_BASED_OUTPATIENT_CLINIC_OR_DEPARTMENT_OTHER): Payer: Self-pay

## 2021-01-30 NOTE — Progress Notes (Signed)
Chief Complaint:   OBESITY [name] is here to discuss her progress with her obesity treatment plan along with follow-up of her obesity related diagnoses. [name] is on keeping a food journal and adhering to recommended goals of 1500-1600 calories and 85+ grams of protein daily and states she is following her eating plan approximately 40% of the time. [name] states she is doing physical therapy for 45 minutes 2 times per week.  Today's visit was #: 63 Starting weight: 240 lbs Starting date: 01/15/2020 Today's weight: 239 lbs Today's date: 01/29/2021 Total lbs lost to date: 1 Total lbs lost since last in-office visit: 0  Interim History: [name]'s last visit was before the holidays and before her knee replacement surgery. She is frustrated with her weight gain. She is ready to get back on track.  Subjective:   1. Type 2 diabetes mellitus with other specified complication, without long-term current use of insulin [name]'s recent A1c was elevated at 6.5, which gives her a new diagnosis of diabetes mellitus. She is open to looking at other medication options to control her glucose and help with weight loss. I discussed labs with the patient today.   2. Vitamin D deficiency [name] is stable on Vit D with no side effects noted.  3. Depression with anxiety [name]'s mood is stable on her medications, although she is understandably unhappy about her diabetes mellitus diagnosis. No side effects were noted.  4. At risk for heart disease [name] is at a higher than average risk for cardiovascular disease due to obesity.   Assessment/Plan:   1. Type 2 diabetes mellitus with other specified complication, without long-term current use of insulin [name] agreed to discontinue Rybelsus, and start Ozempic 0.25 mg q week with no refills. She will continue to work on weight loss, exercise, and decreasing simple carbohydrates to help decrease the risk of diabetes.   - Semaglutide,0.25 or 0.5MG /DOS, (OZEMPIC,  0.25 OR 0.5 MG/DOSE,) 2 MG/1.5ML SOPN; Inject 0.25 mg into the skin once a week.  Dispense: 1.5 mL; Refill: 0  2. Vitamin D deficiency We will refill prescription Vitamin D for 1 month. [name] will follow-up for routine testing of Vitamin D, at least 2-3 times per year to avoid over-replacement.  - Vitamin D, Ergocalciferol, (DRISDOL) 1.25 MG (50000 UNIT) CAPS capsule; TAKE 1 CAPSULE BY MOUTH EVERY 7 DAYS  Dispense: 4 capsule; Refill: 0  3. Depression with anxiety Behavior modification techniques were discussed today to help [name] deal with her emotional/non-hunger eating behaviors. We will refill Wellbutrin SR for 2 months. Orders and follow up as documented in patient record.   - buPROPion (WELLBUTRIN SR) 200 MG 12 hr tablet; TAKE 1 TABLET(200 MG) BY MOUTH DAILY  Dispense: 30 tablet; Refill: 1  4. At risk for heart disease [name] was given approximately 15 minutes of coronary artery disease prevention counseling today. She is 51 y.o. female and has risk factors for heart disease including obesity. We discussed intensive lifestyle modifications today with an emphasis on specific weight loss instructions and strategies.   Repetitive spaced learning was employed today to elicit superior memory formation and behavioral change.  5. Obesity with current BMI 38.6 [name] is currently in the action stage of change. As such, her goal is to continue with weight loss efforts. She has agreed to keeping a food journal and adhering to recommended goals of 1500-1600 calories and 85+ grams of protein daily.   "Real food" protein smoothie recipes were given.   Exercise goals: As is.  Behavioral modification strategies: planning for success and keeping a strict food journal.  [name] has agreed to follow-up with our clinic in 3 to 4 weeks. She was informed of the importance of frequent follow-up visits to maximize her success with intensive lifestyle modifications for her multiple health conditions.    Objective:   Blood pressure 117/78, pulse 95, temperature 99.5 F (37.5 C), temperature source Oral, height 5\' 6"  (1.676 m), weight 239 lb (108.4 kg), last menstrual period 08/17/2014, SpO2 98 %. Body mass index is 38.58 kg/m.  General: Cooperative, alert, well developed, in no acute distress. HEENT: Conjunctivae and lids unremarkable. Cardiovascular: Regular rhythm.  Lungs: Normal work of breathing. Neurologic: No focal deficits.   Lab Results  Component Value Date   CREATININE 0.77 12/03/2020   BUN 16 12/03/2020   NA 140 12/03/2020   K 4.1 12/03/2020   CL 103 12/03/2020   CO2 29 12/03/2020   Lab Results  Component Value Date   ALT 13 12/03/2020   AST 14 12/03/2020   ALKPHOS 65 12/03/2020   BILITOT 0.3 12/03/2020   Lab Results  Component Value Date   HGBA1C 6.5 12/03/2020   HGBA1C 6.1 06/11/2020   HGBA1C 6.0 (H) 08/03/2019   HGBA1C 6.1 08/13/2017   HGBA1C 5.8 (H) 11/05/2016   Lab Results  Component Value Date   INSULIN 19.2 08/03/2019   INSULIN 12.9 11/05/2016   Lab Results  Component Value Date   TSH 1.90 06/11/2020   Lab Results  Component Value Date   CHOL 194 12/03/2020   HDL 41.10 12/03/2020   LDLCALC 115 (H) 12/03/2020   TRIG 189.0 (H) 12/03/2020   CHOLHDL 5 12/03/2020   Lab Results  Component Value Date   VD25OH 33.92 12/03/2020   VD25OH 37.41 06/11/2020   VD25OH 18.3 (L) 08/03/2019   Lab Results  Component Value Date   WBC 8.2 12/03/2020   HGB 12.9 12/03/2020   HCT 39.3 12/03/2020   MCV 91.8 12/03/2020   PLT 447.0 (H) 12/03/2020   No results found for: IRON, TIBC, FERRITIN  Attestation Statements:   Reviewed by clinician on day of visit: allergies, medications, problem list, medical history, surgical history, family history, social history, and previous encounter notes.   I, Trixie Dredge, am acting as transcriptionist for Dennard Nip, MD.  I have reviewed the above documentation for accuracy and completeness, and I agree  with the above. -  Dennard Nip, MD

## 2021-01-30 NOTE — Telephone Encounter (Signed)
I spoke with pt and informed her that she can come to office after 4pm today.

## 2021-02-19 ENCOUNTER — Encounter (INDEPENDENT_AMBULATORY_CARE_PROVIDER_SITE_OTHER): Payer: Self-pay | Admitting: Physician Assistant

## 2021-02-19 ENCOUNTER — Ambulatory Visit (INDEPENDENT_AMBULATORY_CARE_PROVIDER_SITE_OTHER): Payer: BC Managed Care – PPO | Admitting: Physician Assistant

## 2021-02-19 ENCOUNTER — Other Ambulatory Visit: Payer: Self-pay

## 2021-02-19 ENCOUNTER — Other Ambulatory Visit (HOSPITAL_BASED_OUTPATIENT_CLINIC_OR_DEPARTMENT_OTHER): Payer: Self-pay

## 2021-02-19 VITALS — BP 136/75 | HR 78 | Temp 99.2°F | Ht 66.0 in | Wt 232.0 lb

## 2021-02-19 DIAGNOSIS — Z7985 Long-term (current) use of injectable non-insulin antidiabetic drugs: Secondary | ICD-10-CM

## 2021-02-19 DIAGNOSIS — E559 Vitamin D deficiency, unspecified: Secondary | ICD-10-CM | POA: Diagnosis not present

## 2021-02-19 DIAGNOSIS — F418 Other specified anxiety disorders: Secondary | ICD-10-CM | POA: Diagnosis not present

## 2021-02-19 DIAGNOSIS — E1169 Type 2 diabetes mellitus with other specified complication: Secondary | ICD-10-CM

## 2021-02-19 DIAGNOSIS — Z9189 Other specified personal risk factors, not elsewhere classified: Secondary | ICD-10-CM

## 2021-02-19 DIAGNOSIS — Z6837 Body mass index (BMI) 37.0-37.9, adult: Secondary | ICD-10-CM

## 2021-02-19 DIAGNOSIS — E669 Obesity, unspecified: Secondary | ICD-10-CM | POA: Diagnosis not present

## 2021-02-19 MED ORDER — BUPROPION HCL ER (SR) 200 MG PO TB12
200.0000 mg | ORAL_TABLET | Freq: Every day | ORAL | 1 refills | Status: DC
  Filled 2021-02-19 – 2021-02-21 (×2): qty 30, 30d supply, fill #0

## 2021-02-19 MED ORDER — VITAMIN D (ERGOCALCIFEROL) 1.25 MG (50000 UNIT) PO CAPS
ORAL_CAPSULE | ORAL | 0 refills | Status: DC
  Filled 2021-02-19: qty 4, 28d supply, fill #0

## 2021-02-19 MED ORDER — OZEMPIC (0.25 OR 0.5 MG/DOSE) 2 MG/1.5ML ~~LOC~~ SOPN
0.5000 mg | PEN_INJECTOR | SUBCUTANEOUS | 0 refills | Status: DC
  Filled 2021-02-19: qty 1.5, 28d supply, fill #0

## 2021-02-19 NOTE — Progress Notes (Signed)
Chief Complaint:   OBESITY [name] is here to discuss her progress with her obesity treatment plan along with follow-up of her obesity related diagnoses. [name] is on keeping a food journal and adhering to recommended goals of 1500-1600 calories and 85-100 grams of protein and states she is following her eating plan approximately 90% of the time. [name] states she is doing light impact YouTube videos and physical therapy for 15-45 minutes 5 times per week.  Today's visit was #: 18 Starting weight: 240 lbs Starting date: 01/15/2020 Today's weight: 232 lbs Today's date: 02/19/2021 Total lbs lost to date: 8 lbs Total lbs lost since last in-office visit: 7 lbs  Interim History: [name] did well with weight loss. She states Ozempic decreases her appetite and so she struggled to get all of her calories and protein in on some days,  but not on others. She continues to have cravings, especially for pasta.  Subjective:   1. Vitamin D deficiency [name] is currently taking prescription vitamin D 50,000 IU each week. She denies nausea, vomiting or muscle weakness.  2. Type 2 diabetes mellitus with other specified complication, without long-term current use of insulin (HCC) [name] is currently on Ozempic 0.25. Her hunger is controlled on some days but not on others but still with cravings.   3. Depression with anxiety [name] is on Wellbutrin. Her mood is stable. She complains of cravings for pasta.   4.At risk for heart disease [name] is at a higher than average risk for cardiovascular disease due to obesity.    Assessment/Plan:   1. Vitamin D deficiency Low Vitamin D level contributes to fatigue and are associated with obesity, breast, and colon cancer. We will refill prescription Vitamin D 50,000 IU every week for 1 month with no refills and Tammy will follow-up for routine testing of Vitamin D, at least 2-3 times per year to avoid over-replacement.  - Vitamin D, Ergocalciferol, (DRISDOL) 1.25 MG  (50000 UNIT) CAPS capsule; TAKE 1 CAPSULE BY MOUTH EVERY 7 DAYS  Dispense: 4 capsule; Refill: 0  2. Type 2 diabetes mellitus with other specified complication, without long-term current use of insulin ([place]) [name] agrees to increase dose Ozempic to 0.5 mg for 1 month with no refills. Good blood sugar control is important to decrease the likelihood of diabetic complications such as nephropathy, neuropathy, limb loss, blindness, coronary artery disease, and death. Intensive lifestyle modification including diet, exercise and weight loss are the first line of treatment for diabetes.   - Semaglutide,0.25 or 0.5MG /DOS, (OZEMPIC, 0.25 OR 0.5 MG/DOSE,) 2 MG/1.5ML SOPN; Inject 0.5 mg into the skin once a week.  Dispense: 1.5 mL; Refill: 0  3. Depression with anxiety We will refill Wellbutrin for 1 month with no refills. She will consider increasing Wellbutrin next visit is she continues with cravings. Behavior modification techniques were discussed today to help [name] deal with her emotional/non-hunger eating behaviors.  Orders and follow up as documented in patient record.    - buPROPion (WELLBUTRIN SR) 200 MG 12 hr tablet; TAKE 1 TABLET(200 MG) BY MOUTH DAILY  Dispense: 30 tablet; Refill: 1  4. At risk for heart disease [name] was given approximately 15 minutes of coronary artery disease prevention counseling today. She is 51 y.o. female and has risk factors for heart disease including obesity. We discussed intensive lifestyle modifications today with an emphasis on specific weight loss instructions and strategies.   Repetitive spaced learning was employed today to elicit superior memory formation and behavioral change.  5. Obesity with current BMI 37.46 [name] is currently in the action stage of change. As such, her goal is to continue with weight loss efforts. She has agreed to keeping a food journal and adhering to recommended goals of 1500-1600 calories and 85 grams of protein daily.   Exercise  goals:  As is.  Behavioral modification strategies: meal planning and cooking strategies and keeping healthy foods in the home.  [name] has agreed to follow-up with our clinic in 2 weeks. She was informed of the importance of frequent follow-up visits to maximize her success with intensive lifestyle modifications for her multiple health conditions.   Objective:   Blood pressure 136/75, pulse 78, temperature 99.2 F (37.3 C), height 5\' 6"  (1.676 m), weight 232 lb (105.2 kg), last menstrual period 08/17/2014, SpO2 100 %. Body mass index is 37.45 kg/m.  General: Cooperative, alert, well developed, in no acute distress. HEENT: Conjunctivae and lids unremarkable. Cardiovascular: Regular rhythm.  Lungs: Normal work of breathing. Neurologic: No focal deficits.   Lab Results  Component Value Date   CREATININE 0.77 12/03/2020   BUN 16 12/03/2020   NA 140 12/03/2020   K 4.1 12/03/2020   CL 103 12/03/2020   CO2 29 12/03/2020   Lab Results  Component Value Date   ALT 13 12/03/2020   AST 14 12/03/2020   ALKPHOS 65 12/03/2020   BILITOT 0.3 12/03/2020   Lab Results  Component Value Date   HGBA1C 6.5 12/03/2020   HGBA1C 6.1 06/11/2020   HGBA1C 6.0 (H) 08/03/2019   HGBA1C 6.1 08/13/2017   HGBA1C 5.8 (H) 11/05/2016   Lab Results  Component Value Date   INSULIN 19.2 08/03/2019   INSULIN 12.9 11/05/2016   Lab Results  Component Value Date   TSH 1.90 06/11/2020   Lab Results  Component Value Date   CHOL 194 12/03/2020   HDL 41.10 12/03/2020   LDLCALC 115 (H) 12/03/2020   TRIG 189.0 (H) 12/03/2020   CHOLHDL 5 12/03/2020   Lab Results  Component Value Date   VD25OH 33.92 12/03/2020   VD25OH 37.41 06/11/2020   VD25OH 18.3 (L) 08/03/2019   Lab Results  Component Value Date   WBC 8.2 12/03/2020   HGB 12.9 12/03/2020   HCT 39.3 12/03/2020   MCV 91.8 12/03/2020   PLT 447.0 (H) 12/03/2020   No results found for: IRON, TIBC, FERRITIN  Attestation Statements:   Reviewed  by clinician on day of visit: allergies, medications, problem list, medical history, surgical history, family history, social history, and previous encounter notes.  I, Tonye Pearson, am acting as Location manager for [employer], PA-C.  I have reviewed the above documentation for accuracy and completeness, and I agree with the above. Abby Potash, PA-C

## 2021-02-21 ENCOUNTER — Other Ambulatory Visit (HOSPITAL_BASED_OUTPATIENT_CLINIC_OR_DEPARTMENT_OTHER): Payer: Self-pay

## 2021-02-28 ENCOUNTER — Other Ambulatory Visit (HOSPITAL_BASED_OUTPATIENT_CLINIC_OR_DEPARTMENT_OTHER): Payer: Self-pay

## 2021-03-04 ENCOUNTER — Encounter (INDEPENDENT_AMBULATORY_CARE_PROVIDER_SITE_OTHER): Payer: Self-pay | Admitting: Physician Assistant

## 2021-03-04 ENCOUNTER — Other Ambulatory Visit (HOSPITAL_BASED_OUTPATIENT_CLINIC_OR_DEPARTMENT_OTHER): Payer: Self-pay

## 2021-03-04 ENCOUNTER — Other Ambulatory Visit: Payer: Self-pay

## 2021-03-04 ENCOUNTER — Ambulatory Visit (INDEPENDENT_AMBULATORY_CARE_PROVIDER_SITE_OTHER): Payer: BC Managed Care – PPO | Admitting: Physician Assistant

## 2021-03-04 VITALS — BP 125/84 | HR 83 | Temp 99.0°F | Ht 66.0 in | Wt 230.0 lb

## 2021-03-04 DIAGNOSIS — F418 Other specified anxiety disorders: Secondary | ICD-10-CM

## 2021-03-04 DIAGNOSIS — Z6837 Body mass index (BMI) 37.0-37.9, adult: Secondary | ICD-10-CM

## 2021-03-04 DIAGNOSIS — E66812 Obesity, class 2: Secondary | ICD-10-CM

## 2021-03-04 DIAGNOSIS — E1169 Type 2 diabetes mellitus with other specified complication: Secondary | ICD-10-CM | POA: Diagnosis not present

## 2021-03-04 DIAGNOSIS — E669 Obesity, unspecified: Secondary | ICD-10-CM | POA: Diagnosis not present

## 2021-03-04 DIAGNOSIS — Z9189 Other specified personal risk factors, not elsewhere classified: Secondary | ICD-10-CM

## 2021-03-04 MED ORDER — BUPROPION HCL ER (SR) 150 MG PO TB12
150.0000 mg | ORAL_TABLET | Freq: Two times a day (BID) | ORAL | 0 refills | Status: DC
  Filled 2021-03-04: qty 60, 30d supply, fill #0

## 2021-03-04 MED ORDER — OZEMPIC (0.25 OR 0.5 MG/DOSE) 2 MG/1.5ML ~~LOC~~ SOPN
0.5000 mg | PEN_INJECTOR | SUBCUTANEOUS | 0 refills | Status: DC
  Filled 2021-03-04: qty 1.5, 28d supply, fill #0

## 2021-03-04 NOTE — Progress Notes (Signed)
Chief Complaint:   OBESITY [name] is here to discuss her progress with her obesity treatment plan along with follow-up of her obesity related diagnoses. [name] is on keeping a food journal and adhering to recommended goals of 1500-1600 calories and 85 grams protein and states she is following her eating plan approximately 85% of the time. [name] states she is doing physical therapy 45 minutes 2 times per week.  Today's visit was #: 63 Starting weight: 240 lbs Starting date: 01/15/2020 Today's weight: 230 lbs Today's date: 03/04/2021 Total lbs lost to date: 10 Total lbs lost since last in-office visit: 2  Interim History: [name] did well with weight loss. Increasing Ozempic to 0.5 mg helped decrease her appetite. She is averaging 100 grams protein daily and staying within her calories. She wants to start going to the gym for exercise.  Subjective:   1. Type 2 diabetes mellitus with other specified complication, without long-term current use of insulin (HCC) Pt is on Ozempic 0.5 mg with controlled appetite.   2. Depression with anxiety Pt is on 200 mg of Wellbutrin once daily. Cravings are controlled.  3. At risk for activity intolerance [name] is at risk for exercise intolerance.  Assessment/Plan:   1. Type 2 diabetes mellitus with other specified complication, without long-term current use of insulin (HCC) Good blood sugar control is important to decrease the likelihood of diabetic complications such as nephropathy, neuropathy, limb loss, blindness, coronary artery disease, and death. Intensive lifestyle modification including diet, exercise and weight loss are the first line of treatment for diabetes.   Refill- Semaglutide,0.25 or 0.5MG /DOS, (OZEMPIC, 0.25 OR 0.5 MG/DOSE,) 2 MG/1.5ML SOPN; Inject 0.5 mg into the skin once a week.  Dispense: 1.5 mL; Refill: 0  2. Depression with anxiety Behavior modification techniques were discussed today to help [name] deal with her  emotional/non-hunger eating behaviors.  Orders and follow up as documented in patient record.   Increase and Refill- Wellbutrin to 150 mg BID.  3. At risk for activity intolerance [name] was given approximately 15 minutes of exercise intolerance counseling today. She is 51 y.o. female and has risk factors exercise intolerance including obesity. We discussed intensive lifestyle modifications today with an emphasis on specific weight loss instructions and strategies. [name] will slowly increase activity as tolerated.  Repetitive spaced learning was employed today to elicit superior memory formation and behavioral change.  4. Obesity with current BMI 37.14 [name] is currently in the action stage of change. As such, her goal is to continue with weight loss efforts. She has agreed to keeping a food journal and adhering to recommended goals of 1500-1600 calories and 95 grams protein.   Exercise goals:  As is  Behavioral modification strategies: planning for success.  [name] has agreed to follow-up with our clinic in 2 weeks. She was informed of the importance of frequent follow-up visits to maximize her success with intensive lifestyle modifications for her multiple health conditions.   Objective:   Blood pressure 125/84, pulse 83, temperature 99 F (37.2 C), height 5\' 6"  (1.676 m), weight 230 lb (104.3 kg), last menstrual period 08/17/2014, SpO2 97 %. Body mass index is 37.12 kg/m.  General: Cooperative, alert, well developed, in no acute distress. HEENT: Conjunctivae and lids unremarkable. Cardiovascular: Regular rhythm.  Lungs: Normal work of breathing. Neurologic: No focal deficits.   Lab Results  Component Value Date   CREATININE 0.77 12/03/2020   BUN 16 12/03/2020   NA 140 12/03/2020   K 4.1 12/03/2020  CL 103 12/03/2020   CO2 29 12/03/2020   Lab Results  Component Value Date   ALT 13 12/03/2020   AST 14 12/03/2020   ALKPHOS 65 12/03/2020   BILITOT 0.3 12/03/2020   Lab  Results  Component Value Date   HGBA1C 6.5 12/03/2020   HGBA1C 6.1 06/11/2020   HGBA1C 6.0 (H) 08/03/2019   HGBA1C 6.1 08/13/2017   HGBA1C 5.8 (H) 11/05/2016   Lab Results  Component Value Date   INSULIN 19.2 08/03/2019   INSULIN 12.9 11/05/2016   Lab Results  Component Value Date   TSH 1.90 06/11/2020   Lab Results  Component Value Date   CHOL 194 12/03/2020   HDL 41.10 12/03/2020   LDLCALC 115 (H) 12/03/2020   TRIG 189.0 (H) 12/03/2020   CHOLHDL 5 12/03/2020   Lab Results  Component Value Date   VD25OH 33.92 12/03/2020   VD25OH 37.41 06/11/2020   VD25OH 18.3 (L) 08/03/2019   Lab Results  Component Value Date   WBC 8.2 12/03/2020   HGB 12.9 12/03/2020   HCT 39.3 12/03/2020   MCV 91.8 12/03/2020   PLT 447.0 (H) 12/03/2020   Attestation Statements:   Reviewed by clinician on day of visit: allergies, medications, problem list, medical history, surgical history, family history, social history, and previous encounter notes.  Coral Ceo, CMA, am acting as transcriptionist for [employer], PA-C.  I have reviewed the above documentation for accuracy and completeness, and I agree with the above. Abby Potash, PA-C

## 2021-03-12 ENCOUNTER — Other Ambulatory Visit: Payer: BC Managed Care – PPO

## 2021-03-17 ENCOUNTER — Other Ambulatory Visit: Payer: Self-pay

## 2021-03-17 ENCOUNTER — Ambulatory Visit (INDEPENDENT_AMBULATORY_CARE_PROVIDER_SITE_OTHER): Payer: BC Managed Care – PPO | Admitting: Family Medicine

## 2021-03-17 ENCOUNTER — Other Ambulatory Visit (HOSPITAL_BASED_OUTPATIENT_CLINIC_OR_DEPARTMENT_OTHER): Payer: Self-pay

## 2021-03-17 ENCOUNTER — Encounter (INDEPENDENT_AMBULATORY_CARE_PROVIDER_SITE_OTHER): Payer: Self-pay | Admitting: Family Medicine

## 2021-03-17 VITALS — BP 128/83 | HR 100 | Temp 99.1°F | Ht 66.0 in | Wt 230.0 lb

## 2021-03-17 DIAGNOSIS — Z6837 Body mass index (BMI) 37.0-37.9, adult: Secondary | ICD-10-CM | POA: Diagnosis not present

## 2021-03-17 DIAGNOSIS — E669 Obesity, unspecified: Secondary | ICD-10-CM

## 2021-03-17 DIAGNOSIS — F418 Other specified anxiety disorders: Secondary | ICD-10-CM

## 2021-03-17 DIAGNOSIS — E1169 Type 2 diabetes mellitus with other specified complication: Secondary | ICD-10-CM | POA: Diagnosis not present

## 2021-03-17 DIAGNOSIS — Z7985 Long-term (current) use of injectable non-insulin antidiabetic drugs: Secondary | ICD-10-CM

## 2021-03-17 MED ORDER — OZEMPIC (1 MG/DOSE) 4 MG/3ML ~~LOC~~ SOPN
1.0000 mg | PEN_INJECTOR | SUBCUTANEOUS | 0 refills | Status: DC
  Filled 2021-03-17: qty 3, 28d supply, fill #0

## 2021-03-17 MED ORDER — BUPROPION HCL ER (SR) 150 MG PO TB12
150.0000 mg | ORAL_TABLET | Freq: Two times a day (BID) | ORAL | 0 refills | Status: DC
  Filled 2021-03-17: qty 60, 30d supply, fill #0

## 2021-03-18 DIAGNOSIS — E119 Type 2 diabetes mellitus without complications: Secondary | ICD-10-CM | POA: Insufficient documentation

## 2021-03-18 NOTE — Progress Notes (Signed)
Chief Complaint:   OBESITY [name] is here to discuss her progress with her obesity treatment plan along with follow-up of her obesity related diagnoses. [name] is on keeping a food journal and adhering to recommended goals of 1500-1600 calories and 95 grams of protein and states she is following her eating plan approximately 80% of the time. [name] states she is going to the gym for 90 minutes 2 times per week.  Today's visit was #: 20 Starting weight: 240 lbs Starting date: 01/15/2020 Today's weight: 230 lbs Today's date: 03/17/2021 Total lbs lost to date: 10 lbs Total lbs lost since last in-office visit: 0  Interim History: [name] feels a bit disappointed with her lack of weight loss this week.  She has started exercising.  She has been meeting protein and calorie goals.  She denies intake of sugar-sweetened beverages.  Subjective:   1. Type 2 diabetes mellitus with other specified complication, without long-term current use of insulin (HCC) Well-controlled.  She does not check CBGs.  She is on Ozempic 0.5 mg but notes some polyphagia and increased cravings.  Lab Results  Component Value Date   HGBA1C 6.5 12/03/2020   HGBA1C 6.1 06/11/2020   HGBA1C 6.0 (H) 08/03/2019   Lab Results  Component Value Date   MICROALBUR <0.7 06/11/2020   LDLCALC 115 (H) 12/03/2020   CREATININE 0.77 12/03/2020   Lab Results  Component Value Date   INSULIN 19.2 08/03/2019   INSULIN 12.9 11/05/2016   2. Depression with anxiety Her mood is stable.  She notes that her cravings have increased recently.  Assessment/Plan:   1. Type 2 diabetes mellitus with other specified complication, without long-term current use of insulin (HCC) Increase dose of Ozempic to 1 mg weekly.  - Increase Semaglutide, 1 MG/DOSE, (OZEMPIC, 1 MG/DOSE,) 4 MG/3ML SOPN; Inject 1 mg into the skin once a week.  Dispense: 3 mL; Refill: 0  2. Depression with anxiety Refill bupropion 150 mg twice daily. Continue  Lexapro.  - Refill buPROPion (WELLBUTRIN SR) 150 MG 12 hr tablet; Take 1 tablet (150 mg total) by mouth 2 (two) times daily.  Dispense: 60 tablet; Refill: 0  3. Obesity with current BMI 37.14  [name] is currently in the action stage of change. As such, her goal is to continue with weight loss efforts. She has agreed to keeping a food journal and adhering to recommended goals of 1500-1600 calories and 95 grams of protein.   Exercise goals:  As is.  Behavioral modification strategies: keeping a strict food journal.  [name] has agreed to follow-up with our clinic in 3 weeks.  Objective:   Blood pressure 128/83, pulse 100, temperature 99.1 F (37.3 C), height 5\' 6"  (1.676 m), weight 230 lb (104.3 kg), last menstrual period 08/17/2014, SpO2 99 %. Body mass index is 37.12 kg/m.  General: Cooperative, alert, well developed, in no acute distress. HEENT: Conjunctivae and lids unremarkable. Cardiovascular: Regular rhythm.  Lungs: Normal work of breathing. Neurologic: No focal deficits.   Lab Results  Component Value Date   CREATININE 0.77 12/03/2020   BUN 16 12/03/2020   NA 140 12/03/2020   K 4.1 12/03/2020   CL 103 12/03/2020   CO2 29 12/03/2020   Lab Results  Component Value Date   ALT 13 12/03/2020   AST 14 12/03/2020   ALKPHOS 65 12/03/2020   BILITOT 0.3 12/03/2020   Lab Results  Component Value Date   HGBA1C 6.5 12/03/2020   HGBA1C 6.1 06/11/2020   HGBA1C  6.0 (H) 08/03/2019   HGBA1C 6.1 08/13/2017   HGBA1C 5.8 (H) 11/05/2016   Lab Results  Component Value Date   INSULIN 19.2 08/03/2019   INSULIN 12.9 11/05/2016   Lab Results  Component Value Date   TSH 1.90 06/11/2020   Lab Results  Component Value Date   CHOL 194 12/03/2020   HDL 41.10 12/03/2020   LDLCALC 115 (H) 12/03/2020   TRIG 189.0 (H) 12/03/2020   CHOLHDL 5 12/03/2020   Lab Results  Component Value Date   VD25OH 33.92 12/03/2020   VD25OH 37.41 06/11/2020   VD25OH 18.3 (L) 08/03/2019   Lab  Results  Component Value Date   WBC 8.2 12/03/2020   HGB 12.9 12/03/2020   HCT 39.3 12/03/2020   MCV 91.8 12/03/2020   PLT 447.0 (H) 12/03/2020   Attestation Statements:   Reviewed by clinician on day of visit: allergies, medications, problem list, medical history, surgical history, family history, social history, and previous encounter notes.  I, Water quality scientist, CMA, am acting as Location manager for [employer].  I have reviewed the above documentation for accuracy and completeness, and I agree with the above. -  Georgianne Fick, FNP

## 2021-03-24 ENCOUNTER — Encounter: Payer: Self-pay | Admitting: Gastroenterology

## 2021-04-07 ENCOUNTER — Other Ambulatory Visit (HOSPITAL_BASED_OUTPATIENT_CLINIC_OR_DEPARTMENT_OTHER): Payer: Self-pay

## 2021-04-07 ENCOUNTER — Other Ambulatory Visit: Payer: Self-pay | Admitting: Family Medicine

## 2021-04-07 DIAGNOSIS — F418 Other specified anxiety disorders: Secondary | ICD-10-CM

## 2021-04-07 MED ORDER — ESCITALOPRAM OXALATE 10 MG PO TABS
10.0000 mg | ORAL_TABLET | Freq: Every day | ORAL | 1 refills | Status: DC
  Filled 2021-04-07: qty 90, 90d supply, fill #0
  Filled 2021-07-23: qty 90, 90d supply, fill #1

## 2021-04-08 ENCOUNTER — Ambulatory Visit (AMBULATORY_SURGERY_CENTER): Payer: BC Managed Care – PPO | Admitting: *Deleted

## 2021-04-08 ENCOUNTER — Other Ambulatory Visit: Payer: Self-pay

## 2021-04-08 ENCOUNTER — Other Ambulatory Visit (HOSPITAL_BASED_OUTPATIENT_CLINIC_OR_DEPARTMENT_OTHER): Payer: Self-pay

## 2021-04-08 VITALS — Ht 66.0 in | Wt 229.0 lb

## 2021-04-08 DIAGNOSIS — Z1211 Encounter for screening for malignant neoplasm of colon: Secondary | ICD-10-CM

## 2021-04-08 MED ORDER — PEG 3350-KCL-NA BICARB-NACL 420 G PO SOLR
4000.0000 mL | Freq: Once | ORAL | 0 refills | Status: AC
  Filled 2021-04-08: qty 4000, 1d supply, fill #0

## 2021-04-08 NOTE — Progress Notes (Signed)

## 2021-04-09 ENCOUNTER — Other Ambulatory Visit (HOSPITAL_BASED_OUTPATIENT_CLINIC_OR_DEPARTMENT_OTHER): Payer: Self-pay

## 2021-04-15 ENCOUNTER — Ambulatory Visit (INDEPENDENT_AMBULATORY_CARE_PROVIDER_SITE_OTHER): Payer: BC Managed Care – PPO | Admitting: Physician Assistant

## 2021-04-15 ENCOUNTER — Other Ambulatory Visit: Payer: Self-pay

## 2021-04-15 ENCOUNTER — Encounter (INDEPENDENT_AMBULATORY_CARE_PROVIDER_SITE_OTHER): Payer: Self-pay | Admitting: Physician Assistant

## 2021-04-15 ENCOUNTER — Other Ambulatory Visit (HOSPITAL_BASED_OUTPATIENT_CLINIC_OR_DEPARTMENT_OTHER): Payer: Self-pay

## 2021-04-15 VITALS — BP 150/86 | HR 96 | Temp 98.2°F | Ht 66.0 in | Wt 229.0 lb

## 2021-04-15 DIAGNOSIS — E1169 Type 2 diabetes mellitus with other specified complication: Secondary | ICD-10-CM

## 2021-04-15 DIAGNOSIS — F418 Other specified anxiety disorders: Secondary | ICD-10-CM

## 2021-04-15 DIAGNOSIS — E559 Vitamin D deficiency, unspecified: Secondary | ICD-10-CM | POA: Diagnosis not present

## 2021-04-15 DIAGNOSIS — Z6837 Body mass index (BMI) 37.0-37.9, adult: Secondary | ICD-10-CM

## 2021-04-15 DIAGNOSIS — F3289 Other specified depressive episodes: Secondary | ICD-10-CM | POA: Diagnosis not present

## 2021-04-15 DIAGNOSIS — Z9189 Other specified personal risk factors, not elsewhere classified: Secondary | ICD-10-CM

## 2021-04-15 DIAGNOSIS — E669 Obesity, unspecified: Secondary | ICD-10-CM | POA: Diagnosis not present

## 2021-04-15 DIAGNOSIS — Z7985 Long-term (current) use of injectable non-insulin antidiabetic drugs: Secondary | ICD-10-CM

## 2021-04-15 MED ORDER — VITAMIN D (ERGOCALCIFEROL) 1.25 MG (50000 UNIT) PO CAPS
ORAL_CAPSULE | ORAL | 0 refills | Status: DC
  Filled 2021-04-15: qty 4, 28d supply, fill #0

## 2021-04-15 MED ORDER — OZEMPIC (1 MG/DOSE) 4 MG/3ML ~~LOC~~ SOPN
1.0000 mg | PEN_INJECTOR | SUBCUTANEOUS | 0 refills | Status: DC
  Filled 2021-04-15: qty 3, 28d supply, fill #0

## 2021-04-15 MED ORDER — BUPROPION HCL ER (XL) 300 MG PO TB24
300.0000 mg | ORAL_TABLET | Freq: Every day | ORAL | 0 refills | Status: DC
  Filled 2021-04-15: qty 30, 30d supply, fill #0

## 2021-04-16 ENCOUNTER — Other Ambulatory Visit (HOSPITAL_BASED_OUTPATIENT_CLINIC_OR_DEPARTMENT_OTHER): Payer: Self-pay

## 2021-04-17 ENCOUNTER — Other Ambulatory Visit (HOSPITAL_BASED_OUTPATIENT_CLINIC_OR_DEPARTMENT_OTHER): Payer: Self-pay

## 2021-04-17 NOTE — Progress Notes (Signed)
? ? ? ?Chief Complaint:  ? ?OBESITY ?[name] is here to discuss her progress with her obesity treatment plan along with follow-up of her obesity related diagnoses. [name] is on keeping a food journal and adhering to recommended goals of 1500-1600 calories and 95 grams of protein and states she is following her eating plan approximately 80% of the time. [name] states she is doing gym exercise, treadmill, bike, and weights for 60 minutes 2 times per week. ? ?Today's visit was #: 21 ?Starting weight: 240 lbs ?Starting date: 01/15/2020 ?Today's weight: 229 lbs ?Today's date: 04/15/2021 ?Total lbs lost to date: 11 lbs ?Total lbs lost since last in-office visit: 1 lb ? ?Interim History: [name] is trying to get 50-60 grams of protein for breakfast. She is averaging 110 grams of protein and 1500-1600 calories daily. She is a stress eater and has been craving sweets.  ? ?Subjective:  ? ?1. Type 2 diabetes mellitus with other specified complication, without long-term current use of insulin ([place]) ?[name] is currently taking Ozempic 1 mg and she states her appetite is controlled.  ? ?2. Vitamin D deficiency ?[name] is on Vitamin D weekly.  ? ?3. At risk for heart disease ?[name] is at higher than average risk for cardiovascular disease due to obesity.  ? ?4.  Depression with emotional eating ?[name]'s mood is stable. She forgets 2nd dose at times.  ? ?Assessment/Plan:  ? ?1. Type 2 diabetes mellitus with other specified complication, without long-term current use of insulin ([employer]) ?We will refill Ozempic 1 mg for 1 month with no refills. Good blood sugar control is important to decrease the likelihood of diabetic complications such as nephropathy, neuropathy, limb loss, blindness, coronary artery disease, and death. Intensive lifestyle modification including diet, exercise and weight loss are the first line of treatment for diabetes.  ? ?- Semaglutide, 1 MG/DOSE, (OZEMPIC, 1 MG/DOSE,) 4 MG/3ML SOPN; Inject 1 mg into the skin once a week.   Dispense: 3 mL; Refill: 0 ? ?2. Vitamin D deficiency ?Low Vitamin D level contributes to fatigue and are associated with obesity, breast, and colon cancer. We will refill prescription Vitamin D 50,000 IU every week for 1 month with no refills and Mayce will follow-up for routine testing of Vitamin D, at least 2-3 times per year to avoid over-replacement. ? ?- Vitamin D, Ergocalciferol, (DRISDOL) 1.25 MG (50000 UNIT) CAPS capsule; TAKE 1 CAPSULE BY MOUTH EVERY 7 DAYS  Dispense: 4 capsule; Refill: 0 ? ?3. At risk for heart disease ?Maddalynn was given approximately 15 minutes of coronary artery disease prevention counseling today. She is 51 y.o. female and has risk factors for heart disease including obesity. We discussed intensive lifestyle modifications today with an emphasis on specific weight loss instructions and strategies. ? ?Repetitive spaced learning was employed today to elicit superior memory formation and behavioral change.   ? ?4.  Depression with emotional eating ?We will refill bupropion 300 mg for 1 month with no refills. Behavior modification techniques were discussed today to help [name] deal with her emotional/non-hunger eating behaviors.  Orders and follow up as documented in patient record.  ? ?- buPROPion (WELLBUTRIN XL) 300 MG 24 hr tablet; Take 1 tablet (300 mg total) by mouth daily.  Dispense: 30 tablet; Refill: 0 ? ?5. Obesity with current BMI 37.14 ?[name] is currently in the action stage of change. As such, her goal is to continue with weight loss efforts. She has agreed to keeping a food journal and adhering to recommended goals of 1500-1600  calories and 95 grams of protein daily.   ? ?[name] will do fasting labs at next visit.  ? ?Exercise goals:  As is. ? ?Behavioral modification strategies: meal planning and cooking strategies and better snacking choices. ? ?[name] has agreed to follow-up with our clinic in 3 weeks. She was informed of the importance of frequent follow-up visits to maximize  her success with intensive lifestyle modifications for her multiple health conditions.  ? ?Objective:  ? ?Blood pressure (!) 150/86, pulse 96, temperature 98.2 ?F (36.8 ?C), height '5\' 6"'$  (1.676 m), weight 229 lb (103.9 kg), last menstrual period 08/17/2014, SpO2 97 %. ?Body mass index is 36.96 kg/m?. ? ?General: Cooperative, alert, well developed, in no acute distress. ?HEENT: Conjunctivae and lids unremarkable. ?Cardiovascular: Regular rhythm.  ?Lungs: Normal work of breathing. ?Neurologic: No focal deficits.  ? ?Lab Results  ?Component Value Date  ? CREATININE 0.77 12/03/2020  ? BUN 16 12/03/2020  ? NA 140 12/03/2020  ? K 4.1 12/03/2020  ? CL 103 12/03/2020  ? CO2 29 12/03/2020  ? ?Lab Results  ?Component Value Date  ? ALT 13 12/03/2020  ? AST 14 12/03/2020  ? ALKPHOS 65 12/03/2020  ? BILITOT 0.3 12/03/2020  ? ?Lab Results  ?Component Value Date  ? HGBA1C 6.5 12/03/2020  ? HGBA1C 6.1 06/11/2020  ? HGBA1C 6.0 (H) 08/03/2019  ? HGBA1C 6.1 08/13/2017  ? HGBA1C 5.8 (H) 11/05/2016  ? ?Lab Results  ?Component Value Date  ? INSULIN 19.2 08/03/2019  ? INSULIN 12.9 11/05/2016  ? ?Lab Results  ?Component Value Date  ? TSH 1.90 06/11/2020  ? ?Lab Results  ?Component Value Date  ? CHOL 194 12/03/2020  ? HDL 41.10 12/03/2020  ? LDLCALC 115 (H) 12/03/2020  ? TRIG 189.0 (H) 12/03/2020  ? CHOLHDL 5 12/03/2020  ? ?Lab Results  ?Component Value Date  ? VD25OH 33.92 12/03/2020  ? VD25OH 37.41 06/11/2020  ? VD25OH 18.3 (L) 08/03/2019  ? ?Lab Results  ?Component Value Date  ? WBC 8.2 12/03/2020  ? HGB 12.9 12/03/2020  ? HCT 39.3 12/03/2020  ? MCV 91.8 12/03/2020  ? PLT 447.0 (H) 12/03/2020  ? ?No results found for: IRON, TIBC, FERRITIN ? ?Attestation Statements:  ? ?Reviewed by clinician on day of visit: allergies, medications, problem list, medical history, surgical history, family history, social history, and previous encounter notes. ? ?I, Tonye Pearson, am acting as Location manager for [employer], PA-C. ? ?I have reviewed  the above documentation for accuracy and completeness, and I agree with the above. Abby Potash, PA-C ? ?

## 2021-04-28 ENCOUNTER — Encounter: Payer: Self-pay | Admitting: Gastroenterology

## 2021-04-29 ENCOUNTER — Encounter: Payer: BC Managed Care – PPO | Admitting: Gastroenterology

## 2021-05-06 ENCOUNTER — Ambulatory Visit (AMBULATORY_SURGERY_CENTER): Payer: BC Managed Care – PPO | Admitting: Gastroenterology

## 2021-05-06 ENCOUNTER — Encounter: Payer: Self-pay | Admitting: Gastroenterology

## 2021-05-06 VITALS — BP 143/81 | HR 82 | Temp 97.7°F | Resp 16 | Ht 66.0 in | Wt 229.0 lb

## 2021-05-06 DIAGNOSIS — Z1211 Encounter for screening for malignant neoplasm of colon: Secondary | ICD-10-CM | POA: Diagnosis present

## 2021-05-06 MED ORDER — SODIUM CHLORIDE 0.9 % IV SOLN: 500.0000 mL | INTRAVENOUS | Status: DC

## 2021-05-06 NOTE — Op Note (Signed)
Start ?Patient Name: [name] ?Procedure Date: 05/06/2021 2:08 PM ?MRN: [id number] ?Endoscopist: Justice Britain , MD ?Age: 51 ?Referring MD:  ?Date of Birth: Oct 01, 1970 ?Gender: Female ?Account #: [id number] ?Procedure:                Colonoscopy ?Indications:              Screening for colorectal malignant neoplasm, Colon  ?                          cancer screening in patient at increased risk:  ?                          Family history of 1st-degree relative with colon  ?                          polyps at age 54 years (or older) - not high  ?                          risk/advanced adenomas ?Medicines:                Monitored Anesthesia Care ?Procedure:                Pre-Anesthesia Assessment: ?                          - Prior to the procedure, a History and Physical  ?                          was performed, and patient medications and  ?                          allergies were reviewed. The patient's tolerance of  ?                          previous anesthesia was also reviewed. The risks  ?                          and benefits of the procedure and the sedation  ?                          options and risks were discussed with the patient.  ?                          All questions were answered, and informed consent  ?                          was obtained. Prior Anticoagulants: The patient has  ?                          taken no previous anticoagulant or antiplatelet  ?                          agents except for NSAID medication. ASA Grade  ?  Assessment: II - A patient with mild systemic  ?                          disease. After reviewing the risks and benefits,  ?                          the patient was deemed in satisfactory condition to  ?                          undergo the procedure. ?                          After obtaining informed consent, the colonoscope  ?                          was passed under direct vision. Throughout the  ?                           procedure, the patient's blood pressure, pulse, and  ?                          oxygen saturations were monitored continuously. The  ?                          Olympus CF-HQ190L ([id number]) Colonoscope was  ?                          introduced through the anus and advanced to the the  ?                          cecum, identified by appendiceal orifice and  ?                          ileocecal valve. The colonoscopy was performed  ?                          without difficulty. The patient tolerated the  ?                          procedure. The quality of the bowel preparation was  ?                          good. The terminal ileum, ileocecal valve,  ?                          appendiceal orifice, and rectum were photographed. ?Scope In: 2:12:52 PM ?Scope Out: 2:25:43 PM ?Scope Withdrawal Time: 0 hours 8 minutes 41 seconds  ?Total Procedure Duration: 0 hours 12 minutes 51 seconds  ?Findings:                 The perianal exam findings include hemorrhoids. ?                          The terminal ileum and ileocecal valve appeared  ?  normal. ?                          Normal mucosa was found in the entire colon. ?                          Non-bleeding non-thrombosed external and internal  ?                          hemorrhoids were found during retroflexion, during  ?                          perianal exam and during digital exam. The  ?                          hemorrhoids were Grade II (internal hemorrhoids  ?                          that prolapse but reduce spontaneously). ?Complications:            No immediate complications. ?Estimated Blood Loss:     Estimated blood loss: none. ?Impression:               - Hemorrhoids found on perianal exam. ?                          - The examined portion of the ileum was normal. ?                          - Normal mucosa in the entire examined colon. ?                          - Non-bleeding non-thrombosed external and internal  ?                           hemorrhoids. ?Recommendation:           - The patient will be observed post-procedure,  ?                          until all discharge criteria are met. ?                          - Discharge patient to home. ?                          - Patient has a contact number available for  ?                          emergencies. The signs and symptoms of potential  ?                          delayed complications were discussed with the  ?                          patient. Return to normal activities tomorrow.  ?  Written discharge instructions were provided to the  ?                          patient. ?                          - High fiber diet. ?                          - Use FiberCon 1-2 tablets PO daily. ?                          - Continue present medications. ?                          - OK for repeat colonoscopy in 10 years for  ?                          screening purposes, due to patient's mother not  ?                          having advanced adenoma history. ?                          - The findings and recommendations were discussed  ?                          with the patient. ?                          - The findings and recommendations were discussed  ?                          with the patient's family. ?Justice Britain, MD ?05/06/2021 2:30:51 PM ?

## 2021-05-06 NOTE — Patient Instructions (Signed)
Handouts on hemorrhoids given to patient. ?Use FiberCon 1-2 tablets daily. High Fiber Diet recommended. ?OK for repeat colonoscopy in 10 years for screening purposes, due to patient's mother not having advanced adenoma history. ?Continue present medications. ? ?YOU HAD AN ENDOSCOPIC PROCEDURE TODAY AT [facility]:   Refer to the procedure report that was given to you for any specific questions about what was found during the examination.  If the procedure report does not answer your questions, please call your gastroenterologist to clarify.  If you requested that your care partner not be given the details of your procedure findings, then the procedure report has been included in a sealed envelope for you to review at your convenience later. ? ?YOU SHOULD EXPECT: Some feelings of bloating in the abdomen. Passage of more gas than usual.  Walking can help get rid of the air that was put into your GI tract during the procedure and reduce the bloating. If you had a lower endoscopy (such as a colonoscopy or flexible sigmoidoscopy) you may notice spotting of blood in your stool or on the toilet paper. If you underwent a bowel prep for your procedure, you may not have a normal bowel movement for a few days. ? ?Please Note:  You might notice some irritation and congestion in your nose or some drainage.  This is from the oxygen used during your procedure.  There is no need for concern and it should clear up in a day or so. ? ?SYMPTOMS TO REPORT IMMEDIATELY: ? ?Following lower endoscopy (colonoscopy or flexible sigmoidoscopy): ? Excessive amounts of blood in the stool ? Significant tenderness or worsening of abdominal pains ? Swelling of the abdomen that is new, acute ? Fever of 100?F or higher ? ?For urgent or emergent issues, a gastroenterologist can be reached at any hour by calling [phone]. ?Do not use MyChart messaging for urgent concerns.  ? ? ?DIET:  We do recommend a small meal at first, but  then you may proceed to your regular diet.  Drink plenty of fluids but you should avoid alcoholic beverages for 24 hours. ? ?ACTIVITY:  You should plan to take it easy for the rest of today and you should NOT DRIVE or use heavy machinery until tomorrow (because of the sedation medicines used during the test).   ? ?FOLLOW UP: ?Our staff will call the number listed on your records 48-72 hours following your procedure to check on you and address any questions or concerns that you may have regarding the information given to you following your procedure. If we do not reach you, we will leave a message.  We will attempt to reach you two times.  During this call, we will ask if you have developed any symptoms of COVID 19. If you develop any symptoms (ie: fever, flu-like symptoms, shortness of breath, cough etc.) before then, please call [phone].  If you test positive for Covid 19 in the 2 weeks post procedure, please call and report this information to [name].   ? ?If any biopsies were taken you will be contacted by phone or by letter within the next 1-3 weeks.  Please call us at [phone] if you have not heard about the biopsies in 3 weeks.  ? ? ?SIGNATURES/CONFIDENTIALITY: ?You and/or your care partner have signed paperwork which will be entered into your electronic medical record.  These signatures attest to the fact that that the information above on your After Visit Summary has been reviewed and  is understood.  Full responsibility of the confidentiality of this discharge information lies with you and/or your care-partner.  ?

## 2021-05-06 NOTE — Progress Notes (Signed)
Pt non-responsive, VVS, Report to RN  °

## 2021-05-06 NOTE — Progress Notes (Signed)
Pt's states no medical or surgical changes since previsit or office visit. 

## 2021-05-06 NOTE — Progress Notes (Signed)
? ?GASTROENTEROLOGY PROCEDURE H&P NOTE  ? ?Primary Care Physician: ?Ann Held, DO ? ?HPI: ?[name] is a 51 y.o. female who presents for colonoscopy for screening. ? ?Past Medical History:  ?Diagnosis Date  ? Anemia   ? Anxiety   ? Arthritis   ? Depression   ? Diabetes mellitus without complication ([place])   ? Fatigue   ? 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 Hyperlipidemia   ? but meds  ? Hypertension   ? Insulin resistance   ? Joint pain   ? Lower extremity edema   ? Menorrhagia   ? Pre-diabetes   ? Umbilical hernia   ? Vitamin D deficiency   ? ?Past Surgical History:  ?Procedure Laterality Date  ? CESAREAN SECTION    ? CRYOTHERAPY  01/27/1987  ? HYSTEROSCOPY WITH D & C  06/20/2008  ? NOVASURE ABLATION  06/20/2008  ? REPLACEMENT TOTAL KNEE BILATERAL    ? ROBOTIC ASSISTED TOTAL HYSTERECTOMY N/A 08/30/2014  ? Procedure: ROBOTIC ASSISTED ATTEMPTED, THEN CONVERSION TO OPEN TOTAL ABDOMINAL HYSTERECTOMY WITH BILATERAL SALPINGECTOMY;  Surgeon: Servando Salina, MD;  Location: [facility];  Service: Gynecology;  Laterality: N/A;  ? TMJ ARTHROPLASTY  01/27/1996  ? has screws in both side of jaw per pt  ? tubal ligation  06/20/2008  ? UMBILICAL HERNIA REPAIR N/A 08/30/2014  ? Procedure: HERNIA REPAIR UMBILICAL ADULT;  Surgeon: Autumn Messing III, MD;  Location: [facility];  Service: General;  Laterality: N/A;  ? ?Current Outpatient Medications  ?Medication Sig Dispense Refill  ? amLODipine (NORVASC) 10 MG tablet TAKE 1 TABLET BY MOUTH ONCE DAILY 90 tablet 1  ? buPROPion (WELLBUTRIN XL) 300 MG 24 hr tablet Take 1 tablet (300 mg total) by mouth daily. 30 tablet 0  ? celecoxib (CELEBREX) 200 MG capsule Take by mouth 2 (two) times daily.    ? escitalopram (LEXAPRO) 10 MG tablet Take 1 tablet (10 mg total) by mouth at bedtime. 90 tablet 1  ? furosemide (LASIX) 20 MG tablet TAKE 1 TABLET BY MOUTH ONCE DAILY 90 tablet 1  ? nebivolol (BYSTOLIC) 5 MG tablet TAKE 1 TABLET BY MOUTH ONCE DAILY 90 tablet 1  ? potassium chloride (KLOR-CON M) 10 MEQ  tablet TAKE 1 TABLET BY MOUTH ONCE DAILY 90 tablet 1  ? Vitamin D, Ergocalciferol, (DRISDOL) 1.25 MG (50000 UNIT) CAPS capsule TAKE 1 CAPSULE BY MOUTH EVERY 7 DAYS 4 capsule 0  ? LORazepam (ATIVAN) 1 MG tablet Take 1 tablet (1 mg total) by mouth at bedtime. 30 tablet 1  ? rosuvastatin (CRESTOR) 10 MG tablet Take 1 tablet (10 mg total) by mouth at bedtime. (Patient not taking: Reported on 05/06/2021) 30 tablet 3  ? Semaglutide, 1 MG/DOSE, (OZEMPIC, 1 MG/DOSE,) 4 MG/3ML SOPN Inject 1 mg into the skin once a week. 3 mL 0  ? ?Current Facility-Administered Medications  ?Medication Dose Route Frequency Provider Last Rate Last Admin  ? 0.9 %  sodium chloride infusion  500 mL Intravenous Continuous Mansouraty, Telford Nab., MD      ? ? ?Current Outpatient Medications:  ?  amLODipine (NORVASC) 10 MG tablet, TAKE 1 TABLET BY MOUTH ONCE DAILY, Disp: 90 tablet, Rfl: 1 ?  buPROPion (WELLBUTRIN XL) 300 MG 24 hr tablet, Take 1 tablet (300 mg total) by mouth daily., Disp: 30 tablet, Rfl: 0 ?  celecoxib (CELEBREX) 200 MG capsule, Take by mouth 2 (two) times daily., Disp: , Rfl:  ?  escitalopram (LEXAPRO) 10 MG tablet, Take 1 tablet (  10 mg total) by mouth at bedtime., Disp: 90 tablet, Rfl: 1 ?  furosemide (LASIX) 20 MG tablet, TAKE 1 TABLET BY MOUTH ONCE DAILY, Disp: 90 tablet, Rfl: 1 ?  nebivolol (BYSTOLIC) 5 MG tablet, TAKE 1 TABLET BY MOUTH ONCE DAILY, Disp: 90 tablet, Rfl: 1 ?  potassium chloride (KLOR-CON M) 10 MEQ tablet, TAKE 1 TABLET BY MOUTH ONCE DAILY, Disp: 90 tablet, Rfl: 1 ?  Vitamin D, Ergocalciferol, (DRISDOL) 1.25 MG (50000 UNIT) CAPS capsule, TAKE 1 CAPSULE BY MOUTH EVERY 7 DAYS, Disp: 4 capsule, Rfl: 0 ?  LORazepam (ATIVAN) 1 MG tablet, Take 1 tablet (1 mg total) by mouth at bedtime., Disp: 30 tablet, Rfl: 1 ?  rosuvastatin (CRESTOR) 10 MG tablet, Take 1 tablet (10 mg total) by mouth at bedtime. (Patient not taking: Reported on 05/06/2021), Disp: 30 tablet, Rfl: 3 ?  Semaglutide, 1 MG/DOSE, (OZEMPIC, 1 MG/DOSE,) 4  MG/3ML SOPN, Inject 1 mg into the skin once a week., Disp: 3 mL, Rfl: 0 ? ?Current Facility-Administered Medications:  ?  0.9 %  sodium chloride infusion, 500 mL, Intravenous, Continuous, Mansouraty, Telford Nab., MD ?Allergies  ?Allergen Reactions  ? Hydrocodone Other (See Comments)  ?  hallucinations  ? Lisinopril Swelling and Other (See Comments)  ?  Cough ?  ? ?Family History  ?Problem Relation Age of Onset  ? Colon polyps Mother   ? Breast cancer Mother   ? Hypertension Mother   ? Diabetes Mother   ? Cancer Mother   ?     breast  ? Hyperlipidemia Mother   ? Obesity Mother   ? Thyroid disease Mother   ? Hypertension Father   ? Hyperlipidemia Father   ? Anxiety disorder Father   ? Cancer Father   ? Breast cancer Maternal Aunt   ? Cancer Maternal Aunt   ?     beast  ? Colon cancer Neg Hx   ? Esophageal cancer Neg Hx   ? Rectal cancer Neg Hx   ? Stomach cancer Neg Hx   ? ?Social History  ? ?Socioeconomic History  ? Marital status: Divorced  ?  Spouse name: Not on file  ? Number of children: 2  ? Years of education: Not on file  ? Highest education level: Not on file  ?Occupational History  ? Occupation: Pharmacist, hospital  ?  Employer: [employer]  ?Tobacco Use  ? Smoking status: Never  ?  Passive exposure: Never  ? Smokeless tobacco: Never  ?Vaping Use  ? Vaping Use: Never used  ?Substance and Sexual Activity  ? Alcohol use: No  ? Drug use: No  ? Sexual activity: Yes  ?  Birth control/protection: Surgical  ?Other Topics Concern  ? Not on file  ?Social History Narrative  ? Not on file  ? ?Social Determinants of Health  ? ?Financial Resource Strain: Not on file  ?Food Insecurity: Not on file  ?Transportation Needs: Not on file  ?Physical Activity: Not on file  ?Stress: Not on file  ?Social Connections: Not on file  ?Intimate Partner Violence: Not on file  ? ? ?Physical Exam: ?Today's Vitals  ? 05/06/21 1307  ?BP: (!) 159/99  ?Pulse: 79  ?Temp: 97.7 ?F (36.5 ?C)  ?TempSrc: Temporal  ?SpO2: 100%  ?Weight: 229 lb  (103.9 kg)  ?Height: '5\' 6"'$  (1.676 m)  ? ?Body mass index is 36.96 kg/m?. ?GEN: NAD ?EYE: Sclerae anicteric ?ENT: MMM ?CV: Non-tachycardic ?GI: Soft, NT/ND ?NEURO:  Alert & Oriented x 3 ? ?Lab  Results: ?No results for input(s): WBC, HGB, HCT, PLT in the last 72 hours. ?BMET ?No results for input(s): NA, K, CL, CO2, GLUCOSE, BUN, CREATININE, CALCIUM in the last 72 hours. ?LFT ?No results for input(s): PROT, ALBUMIN, AST, ALT, ALKPHOS, BILITOT, BILIDIR, IBILI in the last 72 hours. ?PT/INR ?No results for input(s): LABPROT, INR in the last 72 hours. ? ? ?Impression / Plan: ?This is a 51 y.o.female who presents for colonoscopy for screening. ? ?The risks and benefits of endoscopic evaluation/treatment were discussed with the patient and/or family; these include but are not limited to the risk of perforation, infection, bleeding, missed lesions, lack of diagnosis, severe illness requiring hospitalization, as well as anesthesia and sedation related illnesses.  The patient's history has been reviewed, patient examined, no change in status, and deemed stable for procedure.  The patient and/or family is agreeable to proceed.  ? ? ?Justice Britain, MD ?[facility] ?Advanced Endoscopy ?Office # [phone] ? ?

## 2021-05-07 ENCOUNTER — Ambulatory Visit (INDEPENDENT_AMBULATORY_CARE_PROVIDER_SITE_OTHER): Payer: BC Managed Care – PPO | Admitting: Physician Assistant

## 2021-05-08 ENCOUNTER — Telehealth: Payer: Self-pay | Admitting: *Deleted

## 2021-05-08 NOTE — Telephone Encounter (Signed)
?  Follow up Call- ? ? ?  05/06/2021  ?  1:12 PM  ?Call back number  ?Post procedure Call Back phone  # [phone]  ?Permission to leave phone message Yes  ?  ? ?Patient questions: ? ?Do you have a fever, pain , or abdominal swelling? No. ?Pain Score  0 * ? ?Have you tolerated food without any problems? Yes. ? ?Have you been able to return to your normal activities? Yes.   ? ?Do you have any questions about your discharge instructions: ?Diet   No. ?Medications  No. ?Follow up visit  No. ? ?Do you have questions or concerns about your Care? No. ? ?Actions: ?* If pain score is 4 or above: ?No action needed, pain <4. ? ? ?

## 2021-05-19 ENCOUNTER — Encounter (INDEPENDENT_AMBULATORY_CARE_PROVIDER_SITE_OTHER): Payer: Self-pay | Admitting: Physician Assistant

## 2021-05-19 ENCOUNTER — Encounter (INDEPENDENT_AMBULATORY_CARE_PROVIDER_SITE_OTHER): Payer: Self-pay | Admitting: Family Medicine

## 2021-05-19 ENCOUNTER — Other Ambulatory Visit (INDEPENDENT_AMBULATORY_CARE_PROVIDER_SITE_OTHER): Payer: Self-pay

## 2021-05-19 ENCOUNTER — Other Ambulatory Visit (HOSPITAL_BASED_OUTPATIENT_CLINIC_OR_DEPARTMENT_OTHER): Payer: Self-pay

## 2021-05-19 DIAGNOSIS — E1169 Type 2 diabetes mellitus with other specified complication: Secondary | ICD-10-CM

## 2021-05-19 MED ORDER — OZEMPIC (1 MG/DOSE) 4 MG/3ML ~~LOC~~ SOPN
1.0000 mg | PEN_INJECTOR | SUBCUTANEOUS | 0 refills | Status: DC
  Filled 2021-05-19: qty 3, 28d supply, fill #0

## 2021-05-19 NOTE — Telephone Encounter (Signed)
Ok to refill this time. Thanks

## 2021-05-19 NOTE — Telephone Encounter (Signed)
LAST APPOINTMENT DATE: 04/15/21 ?NEXT APPOINTMENT DATE: 06/03/21 ? ? ? ?[facility] ?[address], [place] [address] ?Phone: [phone] Fax: [phone] ? ?[facility] [place], [address] AT [facility] ?[place] [address] ?Phone: [phone] Fax: [phone] ? ?Patient is requesting a refill of the following medications: ?No prescriptions requested or ordered in this encounter ? ? ?Date last filled: 04/15/21 ?Previously prescribed by Abby Potash, PA-C ? ?Lab Results ?     Component                Value               Date                 ?     HGBA1C                   6.5                 12/03/2020           ?     HGBA1C                   6.1                 06/11/2020           ?     HGBA1C                   6.0 (H)             08/03/2019           ?Lab Results ?     Component                Value               Date                 ?     MICROALBUR               <0.7                06/11/2020           ?     LDLCALC                  115 (H)             12/03/2020           ?     CREATININE               0.77                12/03/2020           ?Lab Results ?     Component                Value               Date                 ?     VD25OH                   33.92               12/03/2020           ?  VD25OH                   37.41               06/11/2020           ?     VD25OH                   18.3 (L)            08/03/2019           ? ?BP Readings from Last 3 Encounters: ?05/06/21 : (!) 143/81 ?04/15/21 : (!) 150/86 ?03/17/21 : 128/83 ?

## 2021-06-03 ENCOUNTER — Ambulatory Visit (INDEPENDENT_AMBULATORY_CARE_PROVIDER_SITE_OTHER): Payer: BC Managed Care – PPO | Admitting: Physician Assistant

## 2021-06-03 ENCOUNTER — Ambulatory Visit: Payer: BC Managed Care – PPO | Admitting: Family Medicine

## 2021-06-12 ENCOUNTER — Encounter (INDEPENDENT_AMBULATORY_CARE_PROVIDER_SITE_OTHER): Payer: Self-pay | Admitting: Adult Health

## 2021-06-12 ENCOUNTER — Other Ambulatory Visit (HOSPITAL_BASED_OUTPATIENT_CLINIC_OR_DEPARTMENT_OTHER): Payer: Self-pay

## 2021-06-12 ENCOUNTER — Ambulatory Visit (INDEPENDENT_AMBULATORY_CARE_PROVIDER_SITE_OTHER): Payer: BC Managed Care – PPO | Admitting: Adult Health

## 2021-06-12 VITALS — BP 126/83 | HR 84 | Temp 99.0°F | Ht 66.0 in | Wt 229.0 lb

## 2021-06-12 DIAGNOSIS — E669 Obesity, unspecified: Secondary | ICD-10-CM | POA: Diagnosis not present

## 2021-06-12 DIAGNOSIS — Z9189 Other specified personal risk factors, not elsewhere classified: Secondary | ICD-10-CM

## 2021-06-12 DIAGNOSIS — F3289 Other specified depressive episodes: Secondary | ICD-10-CM | POA: Diagnosis not present

## 2021-06-12 DIAGNOSIS — E1169 Type 2 diabetes mellitus with other specified complication: Secondary | ICD-10-CM | POA: Diagnosis not present

## 2021-06-12 DIAGNOSIS — Z7985 Long-term (current) use of injectable non-insulin antidiabetic drugs: Secondary | ICD-10-CM

## 2021-06-12 DIAGNOSIS — Z6837 Body mass index (BMI) 37.0-37.9, adult: Secondary | ICD-10-CM

## 2021-06-12 DIAGNOSIS — E559 Vitamin D deficiency, unspecified: Secondary | ICD-10-CM

## 2021-06-12 MED ORDER — OZEMPIC (1 MG/DOSE) 4 MG/3ML ~~LOC~~ SOPN
1.0000 mg | PEN_INJECTOR | SUBCUTANEOUS | 0 refills | Status: DC
  Filled 2021-06-12: qty 3, 28d supply, fill #0

## 2021-06-12 MED ORDER — BUPROPION HCL ER (XL) 300 MG PO TB24
300.0000 mg | ORAL_TABLET | Freq: Every day | ORAL | 0 refills | Status: DC
  Filled 2021-06-12: qty 30, 30d supply, fill #0

## 2021-06-12 MED ORDER — VITAMIN D (ERGOCALCIFEROL) 1.25 MG (50000 UNIT) PO CAPS
ORAL_CAPSULE | ORAL | 0 refills | Status: DC
  Filled 2021-06-12: qty 4, 28d supply, fill #0

## 2021-06-18 NOTE — Progress Notes (Signed)
Chief Complaint:   OBESITY [name] is here to discuss her progress with her obesity treatment plan along with follow-up of her obesity related diagnoses. [name] is on keeping a food journal and adhering to recommended goals of 1500-1600 calories and 95 grams of protein and states she is following her eating plan approximately 60% of the time. [name] states she is exercising 0 minutes 0 times per week.  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  The school year will conclude in middle of June.  When [name] eats "off plan" she will skip meals.  Subjective:   1. Type 2 diabetes mellitus with other specified complication, without long-term current use of insulin (HCC) [name] is currently taking Ozempic 1 mg weekly injection.  She denies mass in neck, dysphagia, dyspepsia, persistent hoarseness, abd pain, or N/VConstipation. She does not check blood glucose at home.  Denies symptoms of hypoglycemia.   2. Vitamin D deficiency On 12/03/20, Teah's Vit D level at 33.92. She is on Ergocalciferol- denies N/V/Muscle Weakness.  3.  Depression with emotional eating. Laurynn is currently taking Bupropion 300 mg, mood stable. Denies suicidal ideas, and homicidal ideas.  4. At risk for activity intolerance. Lian is at risk of exercise intolerance due to obesity.  Assessment/Plan:   1. Type 2 diabetes mellitus with other specified complication, without long-term current use of insulin (HCC) We will refill Ozempic 1 mg SubQ once weekly for 1 month with no refills. Good blood sugar control is important to decrease the likelihood of diabetic complications such as nephropathy, neuropathy, limb loss, blindness, coronary artery disease, and death. Intensive lifestyle modification including  diet, exercise and weight loss are the first line of treatment for diabetes.   -Refill Semaglutide, 1 MG/DOSE, (OZEMPIC, 1 MG/DOSE,) 4 MG/3ML SOPN; Inject 1 mg into the skin once a week.  Dispense: 3 mL; Refill: 0  2. Vitamin D deficiency We will refill ergocalciferol 50,000 IU, once weekly for 1 month with no refills. Low Vitamin D level contributes to fatigue and are associated with obesity, breast, and colon cancer. [name] will follow-up for routine testing of Vitamin D, at least 2-3 times per year to avoid over-replacement.  -Refill Vitamin D, Ergocalciferol, (DRISDOL) 1.25 MG (50000 UNIT) CAPS capsule; TAKE 1 CAPSULE BY MOUTH EVERY 7 DAYS  Dispense: 4 capsule; Refill: 0  3.  Depression with emotional eating We will refill Bupropion XL 300 mg by mouth daily for 1 month with no refills. Behavior modification techniques were discussed today to help [name] deal with her emotional/non-hunger eating behaviors.  Orders and follow up as documented in patient record.   -Refill buPROPion (WELLBUTRIN XL) 300 MG 24 hr tablet; Take 1 tablet (300 mg total) by mouth daily.  Dispense: 30 tablet; Refill: 0  4. At risk for activity intolerance [name] was given approximately 15 minutes of exercise intolerance counseling today. She is 51 y.o. female and has risk factors exercise intolerance including obesity. We discussed intensive lifestyle modifications today with an emphasis on specific weight loss instructions and strategies. [name] will slowly increase activity as tolerated.  Repetitive spaced learning was employed today to elicit superior memory formation and behavioral change.  5. Obesity with current BMI 37.00 [name] is currently in the action stage of change. As such, her goal  is to continue with weight loss efforts. She has agreed to keeping a food journal and adhering to recommended goals of 1500-1600 calories and 95 grams of  protein daily.  Exercise goals: [name] will increase walking 1-2 times a  week.  1). [name] will increase meal plan compliance 80-90%.  2). She will walk 1-2 times a week.  3). We will check fasting labs at next office visit.  Behavioral modification strategies: increasing lean protein intake, decreasing simple carbohydrates, meal planning and cooking strategies, keeping healthy foods in the home, and planning for success.  [name] has agreed to follow-up with our clinic in 4 weeks. She was informed of the importance of frequent follow-up visits to maximize her success with intensive lifestyle modifications for her multiple health conditions.   Objective:   Blood pressure 126/83, pulse 84, temperature 99 F (37.2 C), height '5\' 6"'$  (1.676 m), weight 229 lb (103.9 kg), last menstrual period 08/17/2014, SpO2 99 %. Body mass index is 36.96 kg/m.  General: Cooperative, alert, well developed, in no acute distress. HEENT: Conjunctivae and lids unremarkable. Cardiovascular: Regular rhythm.  Lungs: Normal work of breathing. Neurologic: No focal deficits.   Lab Results  Component Value Date   CREATININE 0.77 12/03/2020   BUN 16 12/03/2020   NA 140 12/03/2020   K 4.1 12/03/2020   CL 103 12/03/2020   CO2 29 12/03/2020   Lab Results  Component Value Date   ALT 13 12/03/2020   AST 14 12/03/2020   ALKPHOS 65 12/03/2020   BILITOT 0.3 12/03/2020   Lab Results  Component Value Date   HGBA1C 6.5 12/03/2020   HGBA1C 6.1 06/11/2020   HGBA1C 6.0 (H) 08/03/2019   HGBA1C 6.1 08/13/2017   HGBA1C 5.8 (H) 11/05/2016   Lab Results  Component Value Date   INSULIN 19.2 08/03/2019   INSULIN 12.9 11/05/2016   Lab Results  Component Value Date   TSH 1.90 06/11/2020   Lab Results  Component Value Date   CHOL 194 12/03/2020   HDL 41.10 12/03/2020   LDLCALC 115 (H) 12/03/2020   TRIG 189.0 (H) 12/03/2020   CHOLHDL 5 12/03/2020   Lab Results  Component Value Date   VD25OH 33.92 12/03/2020   VD25OH 37.41 06/11/2020   VD25OH 18.3 (L) 08/03/2019   Lab Results   Component Value Date   WBC 8.2 12/03/2020   HGB 12.9 12/03/2020   HCT 39.3 12/03/2020   MCV 91.8 12/03/2020   PLT 447.0 (H) 12/03/2020   No results found for: IRON, TIBC, FERRITIN  Attestation Statements:   Reviewed by clinician on day of visit: allergies, medications, problem list, medical history, surgical history, family history, social history, and previous encounter notes.  I, Brendell Tyus, RMA, am acting as transcriptionist for Mina Marble, NP.  I have reviewed the above documentation for accuracy and completeness, and I agree with the above. -  Taeveon Keesling d. Braydyn Schultes, NP-C

## 2021-07-07 ENCOUNTER — Encounter: Payer: Self-pay | Admitting: Family Medicine

## 2021-07-08 ENCOUNTER — Other Ambulatory Visit (HOSPITAL_BASED_OUTPATIENT_CLINIC_OR_DEPARTMENT_OTHER): Payer: Self-pay

## 2021-07-08 ENCOUNTER — Other Ambulatory Visit: Payer: Self-pay | Admitting: Family Medicine

## 2021-07-08 DIAGNOSIS — T753XXA Motion sickness, initial encounter: Secondary | ICD-10-CM

## 2021-07-08 MED ORDER — SCOPOLAMINE 1 MG/3DAYS TD PT72
1.0000 | MEDICATED_PATCH | TRANSDERMAL | 12 refills | Status: DC
  Filled 2021-07-08: qty 10, 30d supply, fill #0

## 2021-07-08 NOTE — Telephone Encounter (Signed)
Patient would like update on rx.

## 2021-07-09 ENCOUNTER — Other Ambulatory Visit: Payer: Self-pay | Admitting: Family Medicine

## 2021-07-09 ENCOUNTER — Ambulatory Visit (INDEPENDENT_AMBULATORY_CARE_PROVIDER_SITE_OTHER): Payer: BC Managed Care – PPO | Admitting: Nurse Practitioner

## 2021-07-09 ENCOUNTER — Other Ambulatory Visit (HOSPITAL_BASED_OUTPATIENT_CLINIC_OR_DEPARTMENT_OTHER): Payer: Self-pay

## 2021-07-09 DIAGNOSIS — M159 Polyosteoarthritis, unspecified: Secondary | ICD-10-CM

## 2021-07-09 NOTE — Telephone Encounter (Signed)
Refill request for tramadol- no longer on med list.

## 2021-07-10 ENCOUNTER — Other Ambulatory Visit (HOSPITAL_BASED_OUTPATIENT_CLINIC_OR_DEPARTMENT_OTHER): Payer: Self-pay

## 2021-07-10 MED ORDER — TRAMADOL HCL 50 MG PO TABS
50.0000 mg | ORAL_TABLET | Freq: Four times a day (QID) | ORAL | 0 refills | Status: AC | PRN
  Filled 2021-07-10: qty 30, 7d supply, fill #0

## 2021-07-23 ENCOUNTER — Other Ambulatory Visit (HOSPITAL_BASED_OUTPATIENT_CLINIC_OR_DEPARTMENT_OTHER): Payer: Self-pay

## 2021-07-23 ENCOUNTER — Encounter: Payer: Self-pay | Admitting: Family Medicine

## 2021-07-23 ENCOUNTER — Telehealth (INDEPENDENT_AMBULATORY_CARE_PROVIDER_SITE_OTHER): Payer: BC Managed Care – PPO | Admitting: Nurse Practitioner

## 2021-07-23 ENCOUNTER — Other Ambulatory Visit: Payer: Self-pay | Admitting: Family Medicine

## 2021-07-23 ENCOUNTER — Other Ambulatory Visit (INDEPENDENT_AMBULATORY_CARE_PROVIDER_SITE_OTHER): Payer: Self-pay | Admitting: Adult Health

## 2021-07-23 DIAGNOSIS — E559 Vitamin D deficiency, unspecified: Secondary | ICD-10-CM | POA: Diagnosis not present

## 2021-07-23 DIAGNOSIS — F3289 Other specified depressive episodes: Secondary | ICD-10-CM

## 2021-07-23 DIAGNOSIS — E1169 Type 2 diabetes mellitus with other specified complication: Secondary | ICD-10-CM | POA: Diagnosis not present

## 2021-07-23 DIAGNOSIS — E669 Obesity, unspecified: Secondary | ICD-10-CM

## 2021-07-23 DIAGNOSIS — R5383 Other fatigue: Secondary | ICD-10-CM

## 2021-07-23 DIAGNOSIS — E538 Deficiency of other specified B group vitamins: Secondary | ICD-10-CM | POA: Diagnosis not present

## 2021-07-23 DIAGNOSIS — Z7985 Long-term (current) use of injectable non-insulin antidiabetic drugs: Secondary | ICD-10-CM

## 2021-07-23 DIAGNOSIS — I10 Essential (primary) hypertension: Secondary | ICD-10-CM

## 2021-07-23 DIAGNOSIS — Z6836 Body mass index (BMI) 36.0-36.9, adult: Secondary | ICD-10-CM

## 2021-07-23 MED ORDER — POTASSIUM CHLORIDE CRYS ER 10 MEQ PO TBCR
EXTENDED_RELEASE_TABLET | Freq: Every day | ORAL | 1 refills | Status: DC
  Filled 2021-07-23: qty 90, 90d supply, fill #0
  Filled 2021-12-22: qty 90, 90d supply, fill #1

## 2021-07-23 MED ORDER — NEBIVOLOL HCL 5 MG PO TABS
ORAL_TABLET | Freq: Every day | ORAL | 1 refills | Status: DC
  Filled 2021-07-23: qty 90, 90d supply, fill #0

## 2021-07-23 MED ORDER — FUROSEMIDE 20 MG PO TABS
ORAL_TABLET | Freq: Every day | ORAL | 1 refills | Status: DC
  Filled 2021-07-23: qty 90, 90d supply, fill #0
  Filled 2021-12-22: qty 90, 90d supply, fill #1

## 2021-07-23 MED ORDER — BUPROPION HCL ER (XL) 300 MG PO TB24
300.0000 mg | ORAL_TABLET | Freq: Every day | ORAL | 0 refills | Status: DC
  Filled 2021-07-23: qty 30, 30d supply, fill #0

## 2021-07-23 MED ORDER — OZEMPIC (1 MG/DOSE) 4 MG/3ML ~~LOC~~ SOPN
1.0000 mg | PEN_INJECTOR | SUBCUTANEOUS | 0 refills | Status: DC
  Filled 2021-07-23: qty 3, 28d supply, fill #0

## 2021-07-23 MED ORDER — AMLODIPINE BESYLATE 10 MG PO TABS
ORAL_TABLET | Freq: Every day | ORAL | 1 refills | Status: DC
  Filled 2021-07-23: qty 90, 90d supply, fill #0
  Filled 2021-12-22: qty 90, 90d supply, fill #1

## 2021-07-23 MED ORDER — VITAMIN D (ERGOCALCIFEROL) 1.25 MG (50000 UNIT) PO CAPS
ORAL_CAPSULE | ORAL | 0 refills | Status: DC
  Filled 2021-07-23: qty 4, 28d supply, fill #0

## 2021-07-23 MED ORDER — AZITHROMYCIN 250 MG PO TABS
ORAL_TABLET | ORAL | 0 refills | Status: DC
  Filled 2021-07-23: qty 6, 5d supply, fill #0

## 2021-07-24 ENCOUNTER — Other Ambulatory Visit (HOSPITAL_BASED_OUTPATIENT_CLINIC_OR_DEPARTMENT_OTHER): Payer: Self-pay

## 2021-07-24 ENCOUNTER — Encounter (INDEPENDENT_AMBULATORY_CARE_PROVIDER_SITE_OTHER): Payer: Self-pay | Admitting: Nurse Practitioner

## 2021-07-24 DIAGNOSIS — E538 Deficiency of other specified B group vitamins: Secondary | ICD-10-CM | POA: Insufficient documentation

## 2021-07-24 DIAGNOSIS — R5383 Other fatigue: Secondary | ICD-10-CM | POA: Insufficient documentation

## 2021-07-24 NOTE — Progress Notes (Signed)
TeleHealth Visit:  Due to the COVID-19 pandemic, this visit was completed with telemedicine (audio/video) technology to reduce patient and provider exposure as well as to preserve personal protective equipment.   [name] has verbally consented to this TeleHealth visit. The patient is located at home, the provider is located at the [facility] office. The participants in this visit include the listed provider and patient and patient. The visit was conducted today via video visit.  Chief Complaint: OBESITY [name] is here to discuss her progress with her obesity treatment plan along with follow-up of her obesity related diagnoses. [name] is on keeping a food journal and adhering to recommended goals of 1500-1600 calories and 95+ protein and states she is following her eating plan approximately 70% of the time. [name] states she is not exercising.  Today's visit was #: 23 Starting weight: 240 lbs Starting date: 01/15/2020  Interim History: Elizet just off a cruise and feels like she has a sinus infection and has been prescribed a z-pack.  She is frustrated because her weight is staying between 220's-230's.  Her highest weight was 240 lbs and her lowest weight was 130's when she was in college.  She started gaining weight after her children 23 years ago.  She struggles with hunger and cravings even with following the meal plan.  She has never taken weight loss medications.   Subjective:   1. Type 2 diabetes mellitus with other specified complication, without long-term current use of insulin (HCC) [name] is currently taking Ozempic 1 mg.  She does not check her blood sugars at home.  She denies any side effects or hypoglycemia.  She is not taking a statin on a regular basis.  2. Low Vitamin B12 level  Last Vitamin B12 level was 08/03/2019, it was 266.  3. Vitamin D deficiency She is taking Vitamin D 50,000  4. Other fatigue Reports fatigue off and on for the past month.  5. Other  Depression with emotional eating Taking Wellbutrin XL 300 mg, she denies any side effects.   Assessment/Plan:   1. Type 2 diabetes mellitus with other specified complication, without long-term current use of insulin (HCC) Shaneeka is scheduled for follow up with her PCP on Friday and labs.  Consider increasing Ozempic or changing to [facility] at next visit.  Side effects discussed with Rodena Piety.   Refill- Semaglutide, 1 MG/DOSE, (OZEMPIC, 1 MG/DOSE,) 4 MG/3ML SOPN; Inject 1 mg into the skin once a week.  Dispense: 3 mL; Refill: 0  2. Low Vitamin B12 level  [name] will request recheck at [facility] office on Friday.   3. Vitamin D deficiency [name] will discuss with PCP at Friday's office visit.  Refill- Vitamin D, Ergocalciferol, (DRISDOL) 1.25 MG (50000 UNIT) CAPS capsule; TAKE 1 CAPSULE BY MOUTH EVERY 7 DAYS  Dispense: 4 capsule; Refill: 0  4. Other fatigue [name] will discuss with PCP at Friday's office visit.  5. Other Depression with emotional eating [name] will discuss with PCP at Friday's office visit.  Refill- buPROPion (WELLBUTRIN XL) 300 MG 24 hr tablet; Take 1 tablet (300 mg total) by mouth daily.  Dispense: 30 tablet; Refill: 0  6. Obesity, current BMI 36.9 Check IC at next visit and track calorie and protein intake.  Bring in log and review at next visit.  [name] is currently in the action stage of change. As such, her goal is to continue with weight loss efforts. She has agreed to keeping a food journal and adhering to recommended  goals of 1500-1800  calories and 95 protein.   Exercise goals:  As is.  Behavioral modification strategies: increasing lean protein intake, increasing water intake, and keeping a strict food journal.  [name] has agreed to follow-up with our clinic in 3 weeks. She was informed of the importance of frequent follow-up visits to maximize her success with intensive lifestyle modifications for her multiple health conditions.  Objective:   VITALS: Per patient  if applicable, see vitals. GENERAL: Alert and in no acute distress. CARDIOPULMONARY: No increased WOB. Speaking in clear sentences.  PSYCH: Pleasant and cooperative. Speech normal rate and rhythm. Affect is appropriate. Insight and judgement are appropriate. Attention is focused, linear, and appropriate.  NEURO: Oriented as arrived to appointment on time with no prompting.   Lab Results  Component Value Date   CREATININE 0.77 12/03/2020   BUN 16 12/03/2020   NA 140 12/03/2020   K 4.1 12/03/2020   CL 103 12/03/2020   CO2 29 12/03/2020   Lab Results  Component Value Date   ALT 13 12/03/2020   AST 14 12/03/2020   ALKPHOS 65 12/03/2020   BILITOT 0.3 12/03/2020   Lab Results  Component Value Date   HGBA1C 6.5 12/03/2020   HGBA1C 6.1 06/11/2020   HGBA1C 6.0 (H) 08/03/2019   HGBA1C 6.1 08/13/2017   HGBA1C 5.8 (H) 11/05/2016   Lab Results  Component Value Date   INSULIN 19.2 08/03/2019   INSULIN 12.9 11/05/2016   Lab Results  Component Value Date   TSH 1.90 06/11/2020   Lab Results  Component Value Date   CHOL 194 12/03/2020   HDL 41.10 12/03/2020   LDLCALC 115 (H) 12/03/2020   TRIG 189.0 (H) 12/03/2020   CHOLHDL 5 12/03/2020   Lab Results  Component Value Date   VD25OH 33.92 12/03/2020   VD25OH 37.41 06/11/2020   VD25OH 18.3 (L) 08/03/2019   Lab Results  Component Value Date   WBC 8.2 12/03/2020   HGB 12.9 12/03/2020   HCT 39.3 12/03/2020   MCV 91.8 12/03/2020   PLT 447.0 (H) 12/03/2020   No results found for: "IRON", "TIBC", "FERRITIN"  Attestation Statements:   Reviewed by clinician on day of visit: allergies, medications, problem list, medical history, surgical history, family history, social history, and previous encounter notes.  I, Davy Pique, RMA, am acting as transcriptionist for Everardo Pacific, FNP  I have reviewed the above documentation for accuracy and completeness, and I agree with the above. Everardo Pacific, FNP

## 2021-07-25 ENCOUNTER — Ambulatory Visit: Payer: BC Managed Care – PPO | Admitting: Family Medicine

## 2021-08-11 ENCOUNTER — Ambulatory Visit: Payer: BC Managed Care – PPO | Admitting: Family Medicine

## 2021-08-11 ENCOUNTER — Encounter: Payer: Self-pay | Admitting: Family Medicine

## 2021-08-12 ENCOUNTER — Ambulatory Visit: Payer: BC Managed Care – PPO | Admitting: Family Medicine

## 2021-08-12 ENCOUNTER — Encounter: Payer: Self-pay | Admitting: Family Medicine

## 2021-08-12 ENCOUNTER — Other Ambulatory Visit (HOSPITAL_BASED_OUTPATIENT_CLINIC_OR_DEPARTMENT_OTHER): Payer: Self-pay

## 2021-08-12 VITALS — BP 138/98 | HR 99 | Temp 98.7°F | Resp 18 | Ht 66.0 in | Wt 238.2 lb

## 2021-08-12 DIAGNOSIS — M159 Polyosteoarthritis, unspecified: Secondary | ICD-10-CM | POA: Diagnosis not present

## 2021-08-12 DIAGNOSIS — Z79899 Other long term (current) drug therapy: Secondary | ICD-10-CM | POA: Diagnosis not present

## 2021-08-12 DIAGNOSIS — F419 Anxiety disorder, unspecified: Secondary | ICD-10-CM

## 2021-08-12 DIAGNOSIS — E1165 Type 2 diabetes mellitus with hyperglycemia: Secondary | ICD-10-CM | POA: Diagnosis not present

## 2021-08-12 DIAGNOSIS — R7303 Prediabetes: Secondary | ICD-10-CM | POA: Diagnosis not present

## 2021-08-12 DIAGNOSIS — F418 Other specified anxiety disorders: Secondary | ICD-10-CM

## 2021-08-12 DIAGNOSIS — E785 Hyperlipidemia, unspecified: Secondary | ICD-10-CM | POA: Diagnosis not present

## 2021-08-12 DIAGNOSIS — Z6836 Body mass index (BMI) 36.0-36.9, adult: Secondary | ICD-10-CM

## 2021-08-12 DIAGNOSIS — E1169 Type 2 diabetes mellitus with other specified complication: Secondary | ICD-10-CM

## 2021-08-12 DIAGNOSIS — E66812 Obesity, class 2: Secondary | ICD-10-CM

## 2021-08-12 DIAGNOSIS — F411 Generalized anxiety disorder: Secondary | ICD-10-CM

## 2021-08-12 DIAGNOSIS — M150 Primary generalized (osteo)arthritis: Secondary | ICD-10-CM

## 2021-08-12 DIAGNOSIS — E559 Vitamin D deficiency, unspecified: Secondary | ICD-10-CM | POA: Diagnosis not present

## 2021-08-12 DIAGNOSIS — L910 Hypertrophic scar: Secondary | ICD-10-CM

## 2021-08-12 DIAGNOSIS — I10 Essential (primary) hypertension: Secondary | ICD-10-CM

## 2021-08-12 LAB — COMPREHENSIVE METABOLIC PANEL
ALT: 10 U/L (ref 0–35)
AST: 11 U/L (ref 0–37)
Albumin: 4.5 g/dL (ref 3.5–5.2)
Alkaline Phosphatase: 68 U/L (ref 39–117)
BUN: 13 mg/dL (ref 6–23)
CO2: 29 mEq/L (ref 19–32)
Calcium: 9.5 mg/dL (ref 8.4–10.5)
Chloride: 103 mEq/L (ref 96–112)
Creatinine, Ser: 0.73 mg/dL (ref 0.40–1.20)
GFR: 95.88 mL/min (ref >60.00)
Glucose, Bld: 87 mg/dL (ref 70–99)
Potassium: 4.4 mEq/L (ref 3.5–5.1)
Sodium: 138 mEq/L (ref 135–145)
Total Bilirubin: 0.5 mg/dL (ref 0.2–1.2)
Total Protein: 7 g/dL (ref 6.0–8.3)

## 2021-08-12 LAB — LIPID PANEL
Cholesterol: 149 mg/dL (ref 0–200)
HDL: 42.9 mg/dL (ref >39.00)
LDL Cholesterol: 79 mg/dL (ref 0–99)
NonHDL: 105.8
Total CHOL/HDL Ratio: 3
Triglycerides: 136 mg/dL (ref 0.0–149.0)
VLDL: 27.2 mg/dL (ref 0.0–40.0)

## 2021-08-12 LAB — CBC WITH DIFFERENTIAL/PLATELET
Basophils Absolute: 0 10*3/uL (ref 0.0–0.1)
Basophils Relative: 0.7 % (ref 0.0–3.0)
Eosinophils Absolute: 0.1 10*3/uL (ref 0.0–0.7)
Eosinophils Relative: 1.6 % (ref 0.0–5.0)
HCT: 40.7 % (ref 36.0–46.0)
Hemoglobin: 13.3 g/dL (ref 12.0–15.0)
Lymphocytes Relative: 34.3 % (ref 12.0–46.0)
Lymphs Abs: 2.5 10*3/uL (ref 0.7–4.0)
MCHC: 32.7 g/dL (ref 30.0–36.0)
MCV: 93.2 fl (ref 78.0–100.0)
Monocytes Absolute: 0.5 10*3/uL (ref 0.1–1.0)
Monocytes Relative: 6.5 % (ref 3.0–12.0)
Neutro Abs: 4.2 10*3/uL (ref 1.4–7.7)
Neutrophils Relative %: 56.9 % (ref 43.0–77.0)
Platelets: 412 10*3/uL — ABNORMAL HIGH (ref 150.0–400.0)
RBC: 4.37 Mil/uL (ref 3.87–5.11)
RDW: 15.3 % (ref 11.5–15.5)
WBC: 7.3 10*3/uL (ref 4.0–10.5)

## 2021-08-12 LAB — VITAMIN D 25 HYDROXY (VIT D DEFICIENCY, FRACTURES): VITD: 48.71 ng/mL (ref 30.00–100.00)

## 2021-08-12 LAB — HEMOGLOBIN A1C: Hgb A1c MFr Bld: 6 % (ref 4.6–6.5)

## 2021-08-12 LAB — TSH: TSH: 2.47 u[IU]/mL (ref 0.35–5.50)

## 2021-08-12 MED ORDER — LORAZEPAM 1 MG PO TABS
1.0000 mg | ORAL_TABLET | Freq: Every day | ORAL | 1 refills | Status: DC
  Filled 2021-08-12: qty 30, 30d supply, fill #0
  Filled 2021-12-22: qty 30, 30d supply, fill #1

## 2021-08-12 MED ORDER — ESCITALOPRAM OXALATE 20 MG PO TABS
20.0000 mg | ORAL_TABLET | Freq: Every day | ORAL | 3 refills | Status: DC
  Filled 2021-08-12: qty 90, 90d supply, fill #0
  Filled 2021-12-22: qty 90, 90d supply, fill #1

## 2021-08-12 MED ORDER — NEBIVOLOL HCL 10 MG PO TABS
10.0000 mg | ORAL_TABLET | Freq: Every day | ORAL | 3 refills | Status: DC
  Filled 2021-08-12: qty 90, 90d supply, fill #0
  Filled 2021-12-22: qty 90, 90d supply, fill #1

## 2021-08-12 NOTE — Assessment & Plan Note (Signed)
Refer to plastic surgeon

## 2021-08-12 NOTE — Assessment & Plan Note (Signed)
F/u [facility]

## 2021-08-12 NOTE — Assessment & Plan Note (Signed)
Inc lexapro to 20 mg  Con' t ativan  F/u 1 month

## 2021-08-12 NOTE — Assessment & Plan Note (Signed)
Inc lexapro 20 mg daily  Refill ativan  F/u 1 month

## 2021-08-12 NOTE — Progress Notes (Signed)
Subjective:   By signing my name below, I, Shehryar Baig, attest that this documentation has been prepared under the direction and in the presence of Ann Held, DO. 08/12/2021    Patient ID: [name], female    DOB: 29-Oct-1970, 51 y.o.   MRN: [id number]  Chief Complaint  Patient presents with   Hypertension   Hyperlipidemia   Follow-up    Hypertension Pertinent negatives include no blurred vision, chest pain, headaches, malaise/fatigue, palpitations or shortness of breath.  Hyperlipidemia Pertinent negatives include no chest pain or shortness of breath.   Patient is in today for a follow up visit.   She complains of episodes of numbness in her right arm and tingling with a cold sensation on the right side of her face that was on and off since Sunday 08/10/2021. She found her symptoms resolved after she rested yesterday. She continues experiencing anxiety at this time. She denies having any chest pain. She reports her blood pressure was elevated on Sunday as well. She notes her symptoms started after her son moved out of her house after graduating from college. She also has anxiety due to her son moving in with her father who is remarrying at this time. She continues taking 10 mg amlodipine daily PO, 20 mg lasix daily PO and reports no new issues while taking it.  BP Readings from Last 3 Encounters:  08/12/21 (!) 138/98  06/12/21 126/83  05/06/21 (!) 143/81   She reports gaining weight since her recent cruise vacation. She continues following up with her healthy weight and wellness provider. She is currently taking ozempic to help assist with weight loss.  She also reports having increase cravings for sweet foods. She has informed her provider and patient thinks it is due to her a1c levels.  Wt Readings from Last 3 Encounters:  08/12/21 238 lb 3.2 oz (108 kg)  06/12/21 229 lb (103.9 kg)  05/06/21 229 lb (103.9 kg)   She is no longer taking 1 mg lorazepam at this  time. She stopped due to the misconception she cannot take escitalopram with lorazepam. She is interested in resuming her 1 mg lorazepam to manage her anxiety.  She completed both her knee procedures and reports she is recovering well. She had  them completed 3 months apart. She found her later procedure completed during the colder season had a more painful recovery.    Past Medical History:  Diagnosis Date   Anemia    Anxiety    Arthritis    Depression    Diabetes mellitus without complication (HCC)    Fatigue    Fibroids    uterine   Hyperlipidemia    but meds   Hypertension    Insulin resistance    Joint pain    Lower extremity edema    Menorrhagia    Pre-diabetes    Umbilical hernia    Vitamin D deficiency     Past Surgical History:  Procedure Laterality Date   CESAREAN SECTION     CRYOTHERAPY  01/27/1987   HYSTEROSCOPY WITH D & C  06/20/2008   NOVASURE ABLATION  06/20/2008   REPLACEMENT TOTAL KNEE BILATERAL     ROBOTIC ASSISTED TOTAL HYSTERECTOMY N/A 08/30/2014   Procedure: ROBOTIC ASSISTED ATTEMPTED, THEN CONVERSION TO OPEN TOTAL ABDOMINAL HYSTERECTOMY WITH BILATERAL SALPINGECTOMY;  Surgeon: Servando Salina, MD;  Location: [facility];  Service: Gynecology;  Laterality: N/A;   TMJ ARTHROPLASTY  01/27/1996   has screws in both side of  jaw per pt   tubal ligation  23/76/2831   UMBILICAL HERNIA REPAIR N/A 08/30/2014   Procedure: HERNIA REPAIR UMBILICAL ADULT;  Surgeon: Autumn Messing III, MD;  Location: [facility];  Service: General;  Laterality: N/A;    Family History  Problem Relation Age of Onset   Colon polyps Mother    Breast cancer Mother    Hypertension Mother    Diabetes Mother    Cancer Mother        breast   Hyperlipidemia Mother    Obesity Mother    Thyroid disease Mother    Hypertension Father    Hyperlipidemia Father    Anxiety disorder Father    Cancer Father    Breast cancer Maternal Aunt    Cancer Maternal Aunt        beast   Colon cancer Neg Hx     Esophageal cancer Neg Hx    Rectal cancer Neg Hx    Stomach cancer Neg Hx     Social History   Socioeconomic History   Marital status: Divorced    Spouse name: Not on file   Number of children: 2   Years of education: Not on file   Highest education level: Not on file  Occupational History   Occupation: Product manager: [employer]  Tobacco Use   Smoking status: Never    Passive exposure: Never   Smokeless tobacco: Never  Vaping Use   Vaping Use: Never used  Substance and Sexual Activity   Alcohol use: No   Drug use: No   Sexual activity: Yes    Birth control/protection: Surgical  Other Topics Concern   Not on file  Social History Narrative   Not on file   Social Determinants of Health   Financial Resource Strain: Not on file  Food Insecurity: Not on file  Transportation Needs: Not on file  Physical Activity: Not on file  Stress: Not on file  Social Connections: Not on file  Intimate Partner Violence: Not on file    Outpatient Medications Prior to Visit  Medication Sig Dispense Refill   amLODipine (NORVASC) 10 MG tablet TAKE 1 TABLET BY MOUTH ONCE DAILY 90 tablet 1   buPROPion (WELLBUTRIN XL) 300 MG 24 hr tablet Take 1 tablet (300 mg total) by mouth daily. 30 tablet 0   celecoxib (CELEBREX) 200 MG capsule Take by mouth 2 (two) times daily.     furosemide (LASIX) 20 MG tablet TAKE 1 TABLET BY MOUTH ONCE DAILY 90 tablet 1   potassium chloride (KLOR-CON M) 10 MEQ tablet TAKE 1 TABLET BY MOUTH ONCE DAILY 90 tablet 1   rosuvastatin (CRESTOR) 10 MG tablet Take 1 tablet (10 mg total) by mouth at bedtime. 30 tablet 3   scopolamine (TRANSDERM SCOP, 1.5 MG,) 1 MG/3DAYS Place 1 patch (1.5 mg total) onto the skin every 3 (three) days. 10 patch 12   Semaglutide, 1 MG/DOSE, (OZEMPIC, 1 MG/DOSE,) 4 MG/3ML SOPN Inject 1 mg into the skin once a week. 3 mL 0   traMADol (ULTRAM) 50 MG tablet Take 1 tablet (50 mg total) by mouth every 6 (six) hours as needed. 30  tablet 0   Vitamin D, Ergocalciferol, (DRISDOL) 1.25 MG (50000 UNIT) CAPS capsule TAKE 1 CAPSULE BY MOUTH EVERY 7 DAYS 4 capsule 0   azithromycin (ZITHROMAX Z-PAK) 250 MG tablet Take 2 tablets by mouth today then take 1 tablet by mouth daily for 4 days 6 each 0   escitalopram (  LEXAPRO) 10 MG tablet Take 1 tablet (10 mg total) by mouth at bedtime. 90 tablet 1   LORazepam (ATIVAN) 1 MG tablet Take 1 tablet (1 mg total) by mouth at bedtime. 30 tablet 1   nebivolol (BYSTOLIC) 5 MG tablet TAKE 1 TABLET BY MOUTH ONCE DAILY 90 tablet 1   Facility-Administered Medications Prior to Visit  Medication Dose Route Frequency Provider Last Rate Last Admin   0.9 %  sodium chloride infusion  500 mL Intravenous Continuous Mansouraty, Telford Nab., MD        Allergies  Allergen Reactions   Hydrocodone Other (See Comments)    hallucinations   Lisinopril Swelling and Other (See Comments)    Cough     Review of Systems  Constitutional:  Negative for fever and malaise/fatigue.  HENT:  Negative for congestion.   Eyes:  Negative for blurred vision.  Respiratory:  Negative for shortness of breath.   Cardiovascular:  Negative for chest pain, palpitations and leg swelling.  Gastrointestinal:  Negative for abdominal pain, blood in stool and nausea.  Genitourinary:  Negative for dysuria and frequency.  Musculoskeletal:  Negative for falls.  Skin:  Negative for rash.  Neurological:  Positive for tingling (right side of face and right arm). Negative for dizziness, loss of consciousness and headaches.  Endo/Heme/Allergies:  Negative for environmental allergies.  Psychiatric/Behavioral:  Negative for depression. The patient is nervous/anxious.        Objective:    Physical Exam Vitals and nursing note reviewed.  Constitutional:      General: She is not in acute distress.    Appearance: Normal appearance. She is not ill-appearing.  HENT:     Head: Normocephalic and atraumatic.     Right Ear: External ear  normal.     Left Ear: External ear normal.  Eyes:     Extraocular Movements: Extraocular movements intact.     Pupils: Pupils are equal, round, and reactive to light.  Cardiovascular:     Rate and Rhythm: Normal rate and regular rhythm.     Heart sounds: Normal heart sounds. No murmur heard.    No gallop.  Pulmonary:     Effort: Pulmonary effort is normal. No respiratory distress.     Breath sounds: Normal breath sounds. No wheezing or rales.  Skin:    General: Skin is warm and dry.  Neurological:     Mental Status: She is alert and oriented to person, place, and time.  Psychiatric:        Judgment: Judgment normal.     BP (!) 138/98 (BP Location: Right Arm, Patient Position: Sitting, Cuff Size: Large)   Pulse 99   Temp 98.7 F (37.1 C) (Oral)   Resp 18   Ht '5\' 6"'$  (1.676 m)   Wt 238 lb 3.2 oz (108 kg)   LMP 08/13/2014   SpO2 97%   BMI 38.45 kg/m  Wt Readings from Last 3 Encounters:  08/12/21 238 lb 3.2 oz (108 kg)  06/12/21 229 lb (103.9 kg)  05/06/21 229 lb (103.9 kg)    Diabetic Foot Exam - Simple   No data filed    Lab Results  Component Value Date   WBC 8.2 12/03/2020   HGB 12.9 12/03/2020   HCT 39.3 12/03/2020   PLT 447.0 (H) 12/03/2020   GLUCOSE 99 12/03/2020   CHOL 194 12/03/2020   TRIG 189.0 (H) 12/03/2020   HDL 41.10 12/03/2020   LDLCALC 115 (H) 12/03/2020   ALT 13 12/03/2020  AST 14 12/03/2020   NA 140 12/03/2020   K 4.1 12/03/2020   CL 103 12/03/2020   CREATININE 0.77 12/03/2020   BUN 16 12/03/2020   CO2 29 12/03/2020   TSH 1.90 06/11/2020   INR 1.0 12/03/2020   HGBA1C 6.5 12/03/2020   MICROALBUR <0.7 06/11/2020    Lab Results  Component Value Date   TSH 1.90 06/11/2020   Lab Results  Component Value Date   WBC 8.2 12/03/2020   HGB 12.9 12/03/2020   HCT 39.3 12/03/2020   MCV 91.8 12/03/2020   PLT 447.0 (H) 12/03/2020   Lab Results  Component Value Date   NA 140 12/03/2020   K 4.1 12/03/2020   CO2 29 12/03/2020   GLUCOSE  99 12/03/2020   BUN 16 12/03/2020   CREATININE 0.77 12/03/2020   BILITOT 0.3 12/03/2020   ALKPHOS 65 12/03/2020   AST 14 12/03/2020   ALT 13 12/03/2020   PROT 7.1 12/03/2020   ALBUMIN 4.4 12/03/2020   CALCIUM 9.7 12/03/2020   ANIONGAP 7 08/31/2014   GFR 90.37 12/03/2020   Lab Results  Component Value Date   CHOL 194 12/03/2020   Lab Results  Component Value Date   HDL 41.10 12/03/2020   Lab Results  Component Value Date   LDLCALC 115 (H) 12/03/2020   Lab Results  Component Value Date   TRIG 189.0 (H) 12/03/2020   Lab Results  Component Value Date   CHOLHDL 5 12/03/2020   Lab Results  Component Value Date   HGBA1C 6.5 12/03/2020       Assessment & Plan:   Problem List Items Addressed This Visit       Unprioritized   Vitamin D deficiency   Relevant Orders   VITAMIN D 25 Hydroxy (Vit-D Deficiency, Fractures)   Generalized anxiety disorder   Relevant Medications   LORazepam (ATIVAN) 1 MG tablet   escitalopram (LEXAPRO) 20 MG tablet   Other Relevant Orders   Drug Tox Monitor 1 w/Conf, Oral Fld   Anxiety   Relevant Medications   LORazepam (ATIVAN) 1 MG tablet   escitalopram (LEXAPRO) 20 MG tablet   Other Relevant Orders   Drug Tox Monitor 1 w/Conf, Oral Fld   Prediabetes   Relevant Orders   CBC with Differential/Platelet   Comprehensive metabolic panel   Lipid panel   Hemoglobin A1c   TSH   VITAMIN D 25 Hydroxy (Vit-D Deficiency, Fractures)   Insulin, random   Primary osteoarthritis involving multiple joints   Diabetes mellitus (HCC)   Relevant Orders   CBC with Differential/Platelet   Comprehensive metabolic panel   Lipid panel   Hemoglobin A1c   TSH   VITAMIN D 25 Hydroxy (Vit-D Deficiency, Fractures)   Insulin, random   Situational anxiety    Inc lexapro to 20 mg  Con' t ativan  F/u 1 month       Relevant Medications   LORazepam (ATIVAN) 1 MG tablet   escitalopram (LEXAPRO) 20 MG tablet   Morbid obesity (HCC)    F/u HWW       Keloid    Refer to plastic surgeon       Relevant Orders   Ambulatory referral to Plastic Surgery   Essential hypertension   Relevant Medications   nebivolol (BYSTOLIC) 10 MG tablet   Depression with anxiety    Inc lexapro 20 mg daily  Refill ativan  F/u 1 month      Relevant Medications   LORazepam (ATIVAN) 1 MG tablet  escitalopram (LEXAPRO) 20 MG tablet   Other Relevant Orders   CBC with Differential/Platelet   Comprehensive metabolic panel   Lipid panel   Hemoglobin A1c   TSH   VITAMIN D 25 Hydroxy (Vit-D Deficiency, Fractures)   Insulin, random   Drug Tox Monitor 1 w/Conf, Oral Fld   Other Visit Diagnoses     Class 2 severe obesity with serious comorbidity and body mass index (BMI) of 36.0 to 36.9 in adult, unspecified obesity type ([place])    -  Primary   Relevant Orders   Hemoglobin A1c   Insulin, random   High risk medication use       Relevant Orders   Drug Tox Monitor 1 w/Conf, Oral Fld   Hyperlipidemia associated with type 2 diabetes mellitus ([place])       Relevant Medications   nebivolol (BYSTOLIC) 10 MG tablet   Other Relevant Orders   CBC with Differential/Platelet   Comprehensive metabolic panel   Lipid panel   Hemoglobin A1c   TSH   VITAMIN D 25 Hydroxy (Vit-D Deficiency, Fractures)   Insulin, random        Meds ordered this encounter  Medications   LORazepam (ATIVAN) 1 MG tablet    Sig: Take 1 tablet (1 mg total) by mouth at bedtime.    Dispense:  30 tablet    Refill:  1   nebivolol (BYSTOLIC) 10 MG tablet    Sig: Take 1 tablet (10 mg total) by mouth daily.    Dispense:  90 tablet    Refill:  3   escitalopram (LEXAPRO) 20 MG tablet    Sig: Take 1 tablet (20 mg total) by mouth daily.    Dispense:  90 tablet    Refill:  3    I, Ann Held, DO, personally preformed the services described in this documentation.  All medical record entries made by the scribe were at my direction and in my presence.  I have reviewed the chart and  discharge instructions (if applicable) and agree that the record reflects my personal performance and is accurate and complete. 08/12/2021   I,Shehryar Baig,acting as a Education administrator for [employer], DO.,have documented all relevant documentation on the behalf of Ann Held, DO,as directed by  Ann Held, DO while in the presence of Ann Held, DO.   Ann Held, DO

## 2021-08-12 NOTE — Patient Instructions (Signed)

## 2021-08-13 LAB — INSULIN, RANDOM: Insulin: 25.5 u[IU]/mL — ABNORMAL HIGH

## 2021-08-14 ENCOUNTER — Encounter (INDEPENDENT_AMBULATORY_CARE_PROVIDER_SITE_OTHER): Payer: Self-pay | Admitting: Nurse Practitioner

## 2021-08-14 ENCOUNTER — Ambulatory Visit (INDEPENDENT_AMBULATORY_CARE_PROVIDER_SITE_OTHER): Payer: BC Managed Care – PPO | Admitting: Nurse Practitioner

## 2021-08-14 ENCOUNTER — Other Ambulatory Visit (HOSPITAL_BASED_OUTPATIENT_CLINIC_OR_DEPARTMENT_OTHER): Payer: Self-pay

## 2021-08-14 VITALS — BP 122/83 | HR 96 | Temp 98.6°F | Ht 66.0 in | Wt 235.0 lb

## 2021-08-14 DIAGNOSIS — R0609 Other forms of dyspnea: Secondary | ICD-10-CM

## 2021-08-14 DIAGNOSIS — E1169 Type 2 diabetes mellitus with other specified complication: Secondary | ICD-10-CM

## 2021-08-14 DIAGNOSIS — E559 Vitamin D deficiency, unspecified: Secondary | ICD-10-CM | POA: Diagnosis not present

## 2021-08-14 DIAGNOSIS — Z6838 Body mass index (BMI) 38.0-38.9, adult: Secondary | ICD-10-CM

## 2021-08-14 DIAGNOSIS — E669 Obesity, unspecified: Secondary | ICD-10-CM

## 2021-08-14 DIAGNOSIS — E785 Hyperlipidemia, unspecified: Secondary | ICD-10-CM | POA: Diagnosis not present

## 2021-08-14 DIAGNOSIS — Z7985 Long-term (current) use of injectable non-insulin antidiabetic drugs: Secondary | ICD-10-CM

## 2021-08-14 DIAGNOSIS — F3289 Other specified depressive episodes: Secondary | ICD-10-CM | POA: Diagnosis not present

## 2021-08-14 MED ORDER — BUPROPION HCL ER (XL) 300 MG PO TB24
300.0000 mg | ORAL_TABLET | Freq: Every day | ORAL | 0 refills | Status: DC
  Filled 2021-08-14 – 2021-08-15 (×2): qty 30, 30d supply, fill #0

## 2021-08-14 MED ORDER — VITAMIN D (ERGOCALCIFEROL) 1.25 MG (50000 UNIT) PO CAPS
ORAL_CAPSULE | ORAL | 0 refills | Status: DC
  Filled 2021-08-14: qty 4, 28d supply, fill #0

## 2021-08-14 MED ORDER — SEMAGLUTIDE (2 MG/DOSE) 8 MG/3ML ~~LOC~~ SOPN
2.0000 mg | PEN_INJECTOR | SUBCUTANEOUS | 0 refills | Status: DC
  Filled 2021-08-14: qty 3, 28d supply, fill #0

## 2021-08-15 ENCOUNTER — Other Ambulatory Visit (HOSPITAL_BASED_OUTPATIENT_CLINIC_OR_DEPARTMENT_OTHER): Payer: Self-pay

## 2021-08-16 LAB — DRUG TOX MONITOR 1 W/CONF, ORAL FLD

## 2021-08-18 NOTE — Progress Notes (Signed)
Chief Complaint:   OBESITY [name] is here to discuss her progress with her obesity treatment plan along with follow-up of her obesity related diagnoses. [name] is on keeping a food journal and adhering to recommended goals of 1500-1600 calories and 95 grams of protein and states she is following her eating plan approximately 25% of the time. [name] states she is exercising 0 minutes 0 times per week.  Today's visit was #: 24 Starting weight: 240 lbs Starting date: 01/15/2020 Today's weight: 235 lbs Today's date: 08/14/2021 Total lbs lost to date: 5 lbs Total lbs lost since last in-office visit: 0  Interim History: [name] has been under stress since last visit. She saw her PCP 2 days ago due to anxiety. Her Lexapro was increased to 20 mg. She is not sleeping well. Her son has moved out. Not drinking enough water. She would like to discuss other options.  Subjective:   1. Type 2 diabetes mellitus with other specified complication, without long-term current use of insulin (HCC) [name] is currently taking Ozempic 1 mg. Denies any side effects. Denies hypoglycemia. On statin, some hunger and cravings. Last insulin was 25.5, A1c was 6.0.  2. Hyperlipidemia associated with type 2 diabetes mellitus ([name]) [name] is taking Crestor 10 mg daily. Reports some muscle pain over the past 2 weeks since taking Crestor on a regular basis.   3. Vitamin D deficiency [name] is currently taking prescription Vit D 50,000 IU once a week. Denies any side effects.  4. Dyspnea on exertion [name]'s IC in 2018 at 1982, 2021 at 2220, today at 1627.  5. Other Depression with emotional eating [name] is currently taking Wellbutrin XL 300 mg and Lexapro recently increased by PCP. Denies any side effects.  Assessment/Plan:   1. Type 2 diabetes mellitus with other specified complication, without long-term current use of insulin (HCC) Increase Ozempic to 2 mg SubQ once weekly for 1 month with 0 refills. Side effects  discussed.   -REFILL/INCREASE Semaglutide, 2 MG/DOSE, 8 MG/3ML SOPN; Inject 2 mg as directed once a week.  Dispense: 3 mL; Refill: 0  2. Hyperlipidemia associated with type 2 diabetes mellitus ([name]) [name] will continue to follow up with PCP and continue medications as directed.  Cardiovascular risk and specific lipid/LDL goals reviewed.  We discussed several lifestyle modifications today and [name] will continue to work on diet, exercise and weight loss efforts. Orders and follow up as documented in patient record.   Counseling Intensive lifestyle modifications are the first line treatment for this issue. Dietary changes: Increase soluble fiber. Decrease simple carbohydrates. Exercise changes: Moderate to vigorous-intensity aerobic activity 150 minutes per week if tolerated. Lipid-lowering medications: see documented in medical record.   3. Vitamin D deficiency We will refill Vit D 50,000 IU once a week for 1 month with 0 refills. Side effects discussed.   Low Vitamin D level contributes to fatigue and are associated with obesity, breast, and colon cancer. She agrees to continue to take prescription Vitamin D '@50'$ ,000 IU every week and will follow-up for routine testing of Vitamin D, at least 2-3 times per year to avoid over-replacement.   -Refill Vitamin D, Ergocalciferol, (DRISDOL) 1.25 MG (50000 UNIT) CAPS capsule; TAKE 1 CAPSULE BY MOUTH EVERY 7 DAYS  Dispense: 4 capsule; Refill: 0  4. Dyspnea on exertion Reviewed with patient today. REE decreased by 600. Obtained 2 IC's today and reviewed both with patient.  5. Other Depression with emotional eating We will refill Wellbutrin XL 300 mg daily  for 1 month with 0 refills. Side effects discussed.   Refill buPROPion (WELLBUTRIN XL) 300 MG 24 hr tablet; Take 1 tablet (300 mg total) by mouth daily.  Dispense: 30 tablet; Refill: 0  6. Obesity, current BMI 38.1 [name] is currently in the action stage of change. As such, her goal is to continue  with weight loss efforts. She has agreed to following a lower carbohydrate, vegetable and lean protein rich diet plan.   Exercise goals: All adults should avoid inactivity. Some physical activity is better than none, and adults who participate in any amount of physical activity gain some health benefits.  Labs reviewed in chart recently.  Behavioral modification strategies: increasing lean protein intake, increasing water intake, and planning for success.  [name] has agreed to follow-up with our clinic in 3 weeks. She was informed of the importance of frequent follow-up visits to maximize her success with intensive lifestyle modifications for her multiple health conditions.   Objective:   Blood pressure 122/83, pulse 96, temperature 98.6 F (37 C), height '5\' 6"'$  (1.676 m), weight 235 lb (106.6 kg), last menstrual period 08/13/2014, SpO2 100 %. Body mass index is 37.93 kg/m.  General: Cooperative, alert, well developed, in no acute distress. HEENT: Conjunctivae and lids unremarkable. Cardiovascular: Regular rhythm.  Lungs: Normal work of breathing. Neurologic: No focal deficits.   Lab Results  Component Value Date   CREATININE 0.73 08/12/2021   BUN 13 08/12/2021   NA 138 08/12/2021   K 4.4 08/12/2021   CL 103 08/12/2021   CO2 29 08/12/2021   Lab Results  Component Value Date   ALT 10 08/12/2021   AST 11 08/12/2021   ALKPHOS 68 08/12/2021   BILITOT 0.5 08/12/2021   Lab Results  Component Value Date   HGBA1C 6.0 08/12/2021   HGBA1C 6.5 12/03/2020   HGBA1C 6.1 06/11/2020   HGBA1C 6.0 (H) 08/03/2019   HGBA1C 6.1 08/13/2017   Lab Results  Component Value Date   INSULIN 19.2 08/03/2019   INSULIN 12.9 11/05/2016   Lab Results  Component Value Date   TSH 2.47 08/12/2021   Lab Results  Component Value Date   CHOL 149 08/12/2021   HDL 42.90 08/12/2021   LDLCALC 79 08/12/2021   TRIG 136.0 08/12/2021   CHOLHDL 3 08/12/2021   Lab Results  Component Value Date    VD25OH 48.71 08/12/2021   VD25OH 33.92 12/03/2020   VD25OH 37.41 06/11/2020   Lab Results  Component Value Date   WBC 7.3 08/12/2021   HGB 13.3 08/12/2021   HCT 40.7 08/12/2021   MCV 93.2 08/12/2021   PLT 412.0 (H) 08/12/2021   No results found for: "IRON", "TIBC", "FERRITIN"  Attestation Statements:   Reviewed by clinician on day of visit: allergies, medications, problem list, medical history, surgical history, family history, social history, and previous encounter notes.  I, Brendell Tyus, RMA, am acting as transcriptionist for Everardo Pacific, FNP.  I have reviewed the above documentation for accuracy and completeness, and I agree with the above. Everardo Pacific, FNP

## 2021-08-22 ENCOUNTER — Other Ambulatory Visit (HOSPITAL_BASED_OUTPATIENT_CLINIC_OR_DEPARTMENT_OTHER): Payer: Self-pay

## 2021-08-26 ENCOUNTER — Other Ambulatory Visit (HOSPITAL_BASED_OUTPATIENT_CLINIC_OR_DEPARTMENT_OTHER): Payer: Self-pay

## 2021-09-03 ENCOUNTER — Encounter (INDEPENDENT_AMBULATORY_CARE_PROVIDER_SITE_OTHER): Payer: Self-pay

## 2021-09-15 ENCOUNTER — Ambulatory Visit (INDEPENDENT_AMBULATORY_CARE_PROVIDER_SITE_OTHER): Payer: BC Managed Care – PPO | Admitting: Nurse Practitioner

## 2021-11-11 ENCOUNTER — Encounter: Payer: Self-pay | Admitting: Family Medicine

## 2021-11-11 ENCOUNTER — Ambulatory Visit: Payer: BC Managed Care – PPO | Admitting: Family

## 2021-11-11 ENCOUNTER — Other Ambulatory Visit (HOSPITAL_BASED_OUTPATIENT_CLINIC_OR_DEPARTMENT_OTHER): Payer: Self-pay

## 2021-11-11 VITALS — BP 133/89 | HR 99 | Temp 98.8°F | Resp 16 | Wt 242.0 lb

## 2021-11-11 DIAGNOSIS — G43909 Migraine, unspecified, not intractable, without status migrainosus: Secondary | ICD-10-CM

## 2021-11-11 MED ORDER — KETOROLAC TROMETHAMINE 60 MG/2ML IM SOLN
60.0000 mg | Freq: Once | INTRAMUSCULAR | Status: AC
  Administered 2021-11-11: 60 mg via INTRAMUSCULAR

## 2021-11-11 MED ORDER — ONDANSETRON HCL 4 MG PO TABS
4.0000 mg | ORAL_TABLET | Freq: Three times a day (TID) | ORAL | 0 refills | Status: AC | PRN
  Filled 2021-11-11: qty 20, 7d supply, fill #0

## 2021-11-11 MED ORDER — SUMATRIPTAN SUCCINATE 50 MG PO TABS
ORAL_TABLET | ORAL | 2 refills | Status: AC
Start: 2021-11-11 — End: ?
  Filled 2021-11-11: qty 10, 30d supply, fill #0

## 2021-11-11 NOTE — Telephone Encounter (Signed)
FYI: Pt has been scheduled this evening at 4:40 pm with MO.  She has tried Excedrin. Has only forgotten her BP medication once in the last week. Has had some nausea and sensitivity to light.

## 2021-11-11 NOTE — Progress Notes (Signed)
Subjective:   By signing my name below, I, Carylon Perches, attest that this documentation has been prepared under the direction and in the presence of Karie Chimera, NP 11/11/2021     Patient ID: [name], female    DOB: 20-Sep-1970, 51 y.o.   MRN: [id number]  Chief Complaint  Patient presents with   Migraine    Complains of ongoing headache since Saturday, felt nauseous today   Hypertension    Patient reports having elevated bp at home up to 160/115     HPI Patient is in today for an office visit  Migraine: She complains of a migraine that appeared on 11/08/2021 and has been persistent since. She does not have a history of migraines. She originally had nausea and light/sound worsen her symptoms. She has used Excedrin and Tylenol. She denies of any vision changes  Blood Pressure: She states that her blood pressure is elevating. She believes that the increase could be due to stress BP Readings from Last 3 Encounters:  11/11/21 133/89  08/14/21 122/83  08/12/21 (!) 138/98   Pulse Readings from Last 3 Encounters:  11/11/21 99  08/14/21 96  08/12/21 99    Health Maintenance Due  Topic Date Due   FOOT EXAM  Never done   OPHTHALMOLOGY EXAM  Never done   Zoster Vaccines- Shingrix (1 of 2) Never done   PAP SMEAR-Modifier  08/27/2014   COVID-19 Vaccine (5 - Pfizer risk series) 06/21/2019   MAMMOGRAM  01/03/2021   Diabetic kidney evaluation - Urine ACR  06/11/2021   INFLUENZA VACCINE  Never done    Past Medical History:  Diagnosis Date   Anemia    Anxiety    Arthritis    Depression    Diabetes mellitus without complication (HCC)    Fatigue    Fibroids    uterine   Hyperlipidemia    but meds   Hypertension    Insulin resistance    Joint pain    Lower extremity edema    Menorrhagia    Pre-diabetes    Umbilical hernia    Vitamin D deficiency     Past Surgical History:  Procedure Laterality Date   CESAREAN SECTION     CRYOTHERAPY  01/27/1987    HYSTEROSCOPY WITH D & C  06/20/2008   NOVASURE ABLATION  06/20/2008   REPLACEMENT TOTAL KNEE BILATERAL     ROBOTIC ASSISTED TOTAL HYSTERECTOMY N/A 08/30/2014   Procedure: ROBOTIC ASSISTED ATTEMPTED, THEN CONVERSION TO OPEN TOTAL ABDOMINAL HYSTERECTOMY WITH BILATERAL SALPINGECTOMY;  Surgeon: Servando Salina, MD;  Location: [facility];  Service: Gynecology;  Laterality: N/A;   TMJ ARTHROPLASTY  01/27/1996   has screws in both side of jaw per pt   tubal ligation  82/50/5397   UMBILICAL HERNIA REPAIR N/A 08/30/2014   Procedure: HERNIA REPAIR UMBILICAL ADULT;  Surgeon: Autumn Messing III, MD;  Location: [facility];  Service: General;  Laterality: N/A;    Family History  Problem Relation Age of Onset   Colon polyps Mother    Breast cancer Mother    Hypertension Mother    Diabetes Mother    Cancer Mother        breast   Hyperlipidemia Mother    Obesity Mother    Thyroid disease Mother    Hypertension Father    Hyperlipidemia Father    Anxiety disorder Father    Cancer Father    Breast cancer Maternal Aunt    Cancer Maternal Aunt  beast   Colon cancer Neg Hx    Esophageal cancer Neg Hx    Rectal cancer Neg Hx    Stomach cancer Neg Hx     Social History   Socioeconomic History   Marital status: Divorced    Spouse name: Not on file   Number of children: 2   Years of education: Not on file   Highest education level: Not on file  Occupational History   Occupation: Teacher    Employer: [employer]  Tobacco Use   Smoking status: Never    Passive exposure: Never   Smokeless tobacco: Never  Vaping Use   Vaping Use: Never used  Substance and Sexual Activity   Alcohol use: No   Drug use: No   Sexual activity: Yes    Birth control/protection: Surgical  Other Topics Concern   Not on file  Social History Narrative   Not on file   Social Determinants of Health   Financial Resource Strain: Not on file  Food Insecurity: Not on file  Transportation Needs: Not on  file  Physical Activity: Not on file  Stress: Not on file  Social Connections: Not on file  Intimate Partner Violence: Not on file    Outpatient Medications Prior to Visit  Medication Sig Dispense Refill   amLODipine (NORVASC) 10 MG tablet TAKE 1 TABLET BY MOUTH ONCE DAILY 90 tablet 1   buPROPion (WELLBUTRIN XL) 300 MG 24 hr tablet Take 1 tablet (300 mg total) by mouth daily. 30 tablet 0   celecoxib (CELEBREX) 200 MG capsule Take by mouth 2 (two) times daily.     escitalopram (LEXAPRO) 20 MG tablet Take 1 tablet (20 mg total) by mouth daily. 90 tablet 3   furosemide (LASIX) 20 MG tablet TAKE 1 TABLET BY MOUTH ONCE DAILY 90 tablet 1   LORazepam (ATIVAN) 1 MG tablet Take 1 tablet (1 mg total) by mouth at bedtime. 30 tablet 1   nebivolol (BYSTOLIC) 10 MG tablet Take 1 tablet (10 mg total) by mouth daily. 90 tablet 3   potassium chloride (KLOR-CON M) 10 MEQ tablet TAKE 1 TABLET BY MOUTH ONCE DAILY 90 tablet 1   rosuvastatin (CRESTOR) 10 MG tablet Take 1 tablet (10 mg total) by mouth at bedtime. 30 tablet 3   Semaglutide, 2 MG/DOSE, 8 MG/3ML SOPN Inject 2 mg as directed once a week. 3 mL 0   traMADol (ULTRAM) 50 MG tablet Take 1 tablet (50 mg total) by mouth every 6 (six) hours as needed. 30 tablet 0   Vitamin D, Ergocalciferol, (DRISDOL) 1.25 MG (50000 UNIT) CAPS capsule TAKE 1 CAPSULE BY MOUTH EVERY 7 DAYS 4 capsule 0   scopolamine (TRANSDERM SCOP, 1.5 MG,) 1 MG/3DAYS Place 1 patch (1.5 mg total) onto the skin every 3 (three) days. 10 patch 12   Facility-Administered Medications Prior to Visit  Medication Dose Route Frequency Provider Last Rate Last Admin   0.9 %  sodium chloride infusion  500 mL Intravenous Continuous Mansouraty, Telford Nab., MD        Allergies  Allergen Reactions   Hydrocodone Other (See Comments)    hallucinations   Lisinopril Swelling and Other (See Comments)    Cough     Review of Systems  Eyes:  Negative for blurred vision and double vision.  Neurological:         (+) Migraine       Objective:    Physical Exam Constitutional:      General: She is  not in acute distress.    Appearance: Normal appearance. She is not ill-appearing.  HENT:     Head: Normocephalic and atraumatic.     Right Ear: External ear normal.     Left Ear: External ear normal.  Eyes:     Extraocular Movements: Extraocular movements intact.     Pupils: Pupils are equal, round, and reactive to light.  Cardiovascular:     Rate and Rhythm: Normal rate and regular rhythm.     Heart sounds: Normal heart sounds. No murmur heard.    No gallop.  Pulmonary:     Effort: Pulmonary effort is normal. No respiratory distress.     Breath sounds: Normal breath sounds. No wheezing or rales.  Musculoskeletal:     Comments: 5/5 strength upper and lower extremeties  Skin:    General: Skin is warm and dry.  Neurological:     Mental Status: She is alert and oriented to person, place, and time.     Cranial Nerves: No cranial nerve deficit or dysarthria.  Psychiatric:        Mood and Affect: Mood normal.        Behavior: Behavior normal.        Judgment: Judgment normal.     BP 133/89 (BP Location: Left Arm, Patient Position: Sitting, Cuff Size: Large)   Pulse 99   Temp 98.8 F (37.1 C) (Oral)   Resp 16   Wt 242 lb (109.8 kg)   LMP 08/13/2014   SpO2 99%   BMI 39.06 kg/m  Wt Readings from Last 3 Encounters:  11/11/21 242 lb (109.8 kg)  08/14/21 235 lb (106.6 kg)  08/12/21 238 lb 3.2 oz (108 kg)       Assessment & Plan:   Problem List Items Addressed This Visit       Unprioritized   Migraine without status migrainosus, not intractable - Primary    New. Rx sent for prn imitrex and prn zofran. Toradol '60mg'$  IM here in the office. Pt is advised to call if symptoms worsen or if symptoms do not improve.       Relevant Medications   SUMAtriptan (IMITREX) 50 MG tablet   Meds ordered this encounter  Medications   SUMAtriptan (IMITREX) 50 MG tablet    Sig: May repeat  in 2 hours if headache persists or recurs.    Dispense:  10 tablet    Refill:  2    Order Specific Question:   Supervising Provider    Answer:   Penni Homans A [[id number]]   ondansetron (ZOFRAN) 4 MG tablet    Sig: Take 1 tablet (4 mg total) by mouth every 8 (eight) hours as needed for nausea or vomiting.    Dispense:  20 tablet    Refill:  0    Order Specific Question:   Supervising Provider    Answer:   Penni Homans A [[id number]]    I, Nance Pear, NP, personally preformed the services described in this documentation.  All medical record entries made by the scribe were at my direction and in my presence.  I have reviewed the chart and discharge instructions (if applicable) and agree that the record reflects my personal performance and is accurate and complete. 11/11/2021   I,Amber Collins,acting as a scribe for Nance Pear, NP.,have documented all relevant documentation on the behalf of Nance Pear, NP,as directed by  Nance Pear, NP while in the presence of Nance Pear, NP.  Nance Pear, NP

## 2021-11-11 NOTE — Assessment & Plan Note (Signed)
New. Rx sent for prn imitrex and prn zofran. Toradol '60mg'$  IM here in the office. Pt is advised to call if symptoms worsen or if symptoms do not improve.

## 2021-11-11 NOTE — Telephone Encounter (Signed)
Nurse Assessment Nurse: Rolin Barry, RN, Levada Dy Date/Time [name] Time): 11/11/2021 9:30:20 AM Confirm and document reason for call. If symptomatic, describe symptoms. ---Caller having throbbing headache and body aches along with leg pains, Sx started over the weekend. Had a knee replacement last year. worsens at night. Does the patient have any new or worsening symptoms? ---Yes Will a triage be completed? ---Yes Related visit to physician within the last 2 weeks? ---No Does the PT have any chronic conditions? (i.e. diabetes, asthma, this includes High risk factors for pregnancy, etc.) ---Yes List chronic conditions. ---HTN knee replacement Is the patient pregnant or possibly pregnant? (Ask all females between the ages of 77-55) ---No Is this a behavioral health or substance abuse call? ---No Guidelines Guideline Title Affirmed Question Affirmed Notes Nurse Date/Time (Eastern Time) Headache [1] MODERATE headache (e.g., interferes with normal activities) AND [2] present > 24 hours AND [3] unexplained (Exceptions: analgesics not tried, Research scientist (medical), RN, Levada Dy 11/11/2021 9:33:03 AM PLEASE NOTE: All timestamps contained within this report are represented as [place] Standard Time. CONFIDENTIALTY NOTICE: This fax transmission is intended only for the addressee. It contains information that is legally privileged, confidential or otherwise protected from use or disclosure. If you are not the intended recipient, you are strictly prohibited from reviewing, disclosing, copying using or disseminating any of this information or taking any action in reliance on or regarding this information. If you have received this fax in error, please notify us immediately by telephone so that we can arrange for its return to [name]. Phone: [phone], Toll-Free: [phone], Fax: [phone] Page: 2 of 2 Call Id: [id number] Guidelines Guideline Title Affirmed Question Affirmed Notes Nurse Date/Time  [name] Time) typical migraine, or headache part of viral illness) Disp. Time [name] Time) Disposition Final User 11/11/2021 9:36:07 AM See PCP within 24 Hours Yes Deaton, RN, Levada Dy Final Disposition 11/11/2021 9:36:07 AM See PCP within 24 Hours Yes Deaton, RN, [name] Disagree/Comply Comply Caller Understands Yes PreDisposition Did not know what to do Care Advice Given Per Guideline SEE PCP WITHIN 24 HOURS: * IF OFFICE WILL BE OPEN: You need to be examined within the next 24 hours. Call your doctor (or NP/PA) when the office opens and make an appointment. * ACETAMINOPHEN - EXTRA STRENGTH TYLENOL: Take 1,000 mg (two 500 mg pills) every 6 to 8 hours as needed. Each Extra Strength Tylenol pill has 500 mg of acetaminophen. The most you should take is 6 pills a day (3,000 mg total). Note: In [place], the maximum is 8 pills a day (4,000 mg total). REST FOR HEADACHE: * Lie down in a dark quiet place and relax until feeling better. * Close your eyes and imagine your entire body relaxing. COLD PACK FOR HEADACHE: * Put a cold pack or an ice bag (wrapped in a moist towel) on the forehead CALL BACK IF: * You become worse CARE ADVICE given per Headache (Adult) guideline. Comments User: Saverio Danker, RN Date/Time [name] Time): 11/11/2021 9:36:55 AM Body aches off and on. States that her knee is bothering her, had arthritis before the sx. User: Saverio Danker, RN Date/Time [name] Time): 11/11/2021 9:41:15 AM Caller was connected with [name] at the office for appt options after 4pm , per caller request. User: Saverio Danker, RN Date/Time [name] Time): 11/11/2021 9:44:48 AM Notified Lead PC and charge nurse of the directive differences from previous calls. User: Saverio Danker, RN Date/Time [name] Time): 11/11/2021 9:54:23 AM Charge nurse advised that this happened during the weekend and that client services have been notified. Closing  current chart out. Referrals REFERRED TO PCP OFFICE

## 2021-11-13 ENCOUNTER — Encounter: Payer: Self-pay | Admitting: Nurse Practitioner

## 2021-11-13 ENCOUNTER — Ambulatory Visit: Payer: BC Managed Care – PPO | Admitting: Nurse Practitioner

## 2021-11-13 ENCOUNTER — Other Ambulatory Visit (HOSPITAL_BASED_OUTPATIENT_CLINIC_OR_DEPARTMENT_OTHER): Payer: Self-pay

## 2021-11-13 VITALS — BP 127/86 | HR 84 | Temp 98.7°F | Ht 66.0 in | Wt 237.0 lb

## 2021-11-13 DIAGNOSIS — E559 Vitamin D deficiency, unspecified: Secondary | ICD-10-CM | POA: Diagnosis not present

## 2021-11-13 DIAGNOSIS — Z6838 Body mass index (BMI) 38.0-38.9, adult: Secondary | ICD-10-CM

## 2021-11-13 DIAGNOSIS — E669 Obesity, unspecified: Secondary | ICD-10-CM | POA: Diagnosis not present

## 2021-11-13 DIAGNOSIS — E1169 Type 2 diabetes mellitus with other specified complication: Secondary | ICD-10-CM | POA: Diagnosis not present

## 2021-11-13 DIAGNOSIS — Z7985 Long-term (current) use of injectable non-insulin antidiabetic drugs: Secondary | ICD-10-CM

## 2021-11-13 DIAGNOSIS — F3289 Other specified depressive episodes: Secondary | ICD-10-CM | POA: Diagnosis not present

## 2021-11-13 MED ORDER — OZEMPIC (0.25 OR 0.5 MG/DOSE) 2 MG/3ML ~~LOC~~ SOPN
0.2500 mg | PEN_INJECTOR | SUBCUTANEOUS | 0 refills | Status: DC
  Filled 2021-11-13: qty 3, 28d supply, fill #0

## 2021-11-13 MED ORDER — VITAMIN D (ERGOCALCIFEROL) 1.25 MG (50000 UNIT) PO CAPS
ORAL_CAPSULE | ORAL | 0 refills | Status: DC
  Filled 2021-11-13: qty 4, 28d supply, fill #0

## 2021-11-13 MED ORDER — BUPROPION HCL ER (XL) 300 MG PO TB24
300.0000 mg | ORAL_TABLET | Freq: Every day | ORAL | 0 refills | Status: DC
  Filled 2021-11-13: qty 30, 30d supply, fill #0

## 2021-11-19 NOTE — Progress Notes (Signed)
Chief Complaint:   OBESITY [name] is here to discuss her progress with her obesity treatment plan along with follow-up of her obesity related diagnoses. [name] is on following a lower carbohydrate, vegetable and lean protein rich diet plan and states she is following her eating plan approximately 50% of the time. [name] states she is walking 15 minutes 3 times per week.  Today's visit was #: 25 Starting weight: 240 lbs Starting date: 01/15/2020 Today's weight: 237 lbs Today's date: 11/13/2021 Total lbs lost to date: 3 lbs Total lbs lost since last in-office visit: 0  Interim History: [name] was seen here last on 08/14/21. She has not been consistent with her meal plan.  She is walking at work for 15 mins, 3 times a week. She is eating " depends on the day". She is skipping meals.  She is drinking water, zero [name] drinks and occasional sodas (2 times a month).  Subjective:   1. Type 2 diabetes mellitus with other specified complication, without long-term current use of insulin (HCC) [name]'s Ozempic was increased to '2mg'$  after her last visit. Has not been taking Ozempic over the past 2 months but did take it the other day because she had a dose left. Had to go see her PCP for a headache. Her last A1c was 6.0. She does not check blood [name] at home. Denies hypoglycemia. Last eye exam: 2022. She is on a statin.  2. Vitamin D deficiency [name] is currently taking prescription Vit D 50,000 IU once a week. Denies any nausea, vomiting or muscle weakness.  3. Other Depression with emotional eating [name] is taking Wellbutrin XL 300 mg. Denies any side effects.  Assessment/Plan:   1. Type 2 diabetes mellitus with other specified complication, without long-term current use of insulin (HCC) Restart/Refill Ozempic 0.25 mg once weekly for 1 month with 0 refills. Side effects discussed. Discussed the importance of taking medications as directed.     -Restart/Refill Semaglutide,0.25 or 0.'5MG'$ /DOS,  (OZEMPIC, 0.25 OR 0.5 MG/DOSE,) 2 MG/3ML SOPN; Inject 0.25 mg into the skin once a week.  Dispense: 3 mL; Refill: 0  2. Vitamin D deficiency We will refill Vit D 50,000 IU once weekly for 1 month with 0 refills. Side effects discussed.   -Refill Vitamin D, Ergocalciferol, (DRISDOL) 1.25 MG (50000 UNIT) CAPS capsule; TAKE 1 CAPSULE BY MOUTH EVERY 7 DAYS  Dispense: 4 capsule; Refill: 0  3. Other Depression with emotional eating We will refill Wellbutrin XL 300 mg daily for 1 month with 0 refills.  -Refill buPROPion (WELLBUTRIN XL) 300 MG 24 hr tablet; Take 1 tablet (300 mg total) by mouth daily.  Dispense: 30 tablet; Refill: 0  4. Obesity, current BMI 38.3 [name] is currently in the action stage of change. As such, her goal is to continue with weight loss efforts. She has agreed to keeping a food journal and adhering to recommended goals of 1200 calories and 75+ grams of protein.   Multiple handout given today. Use Lose It app. Discussed website, Skinny [email or web address] and meal prepping with [employer].  Exercise goals: All adults should avoid inactivity. Some physical activity is better than none, and adults who participate in any amount of physical activity gain some health benefits.  Behavioral modification strategies: increasing lean protein intake, increasing vegetables, increasing water intake, no skipping meals, planning for success, and keeping a strict food journal.  [name] has agreed to follow-up with our clinic in 3 weeks. She was informed of the importance of frequent  follow-up visits to maximize her success with intensive lifestyle modifications for her multiple health conditions.   Objective:   Blood pressure 127/86, pulse 84, temperature 98.7 F (37.1 C), temperature source Oral, height '5\' 6"'$  (1.676 m), weight 237 lb (107.5 kg), last menstrual period 08/13/2014, SpO2 100 %. Body mass index is 38.25 kg/m.  General: Cooperative, alert, well developed, in no acute distress. HEENT:  Conjunctivae and lids unremarkable. Cardiovascular: Regular rhythm.  Lungs: Normal work of breathing. Neurologic: No focal deficits.   Lab Results  Component Value Date   CREATININE 0.73 08/12/2021   BUN 13 08/12/2021   NA 138 08/12/2021   K 4.4 08/12/2021   CL 103 08/12/2021   CO2 29 08/12/2021   Lab Results  Component Value Date   ALT 10 08/12/2021   AST 11 08/12/2021   ALKPHOS 68 08/12/2021   BILITOT 0.5 08/12/2021   Lab Results  Component Value Date   HGBA1C 6.0 08/12/2021   HGBA1C 6.5 12/03/2020   HGBA1C 6.1 06/11/2020   HGBA1C 6.0 (H) 08/03/2019   HGBA1C 6.1 08/13/2017   Lab Results  Component Value Date   INSULIN 19.2 08/03/2019   INSULIN 12.9 11/05/2016   Lab Results  Component Value Date   TSH 2.47 08/12/2021   Lab Results  Component Value Date   CHOL 149 08/12/2021   HDL 42.90 08/12/2021   LDLCALC 79 08/12/2021   TRIG 136.0 08/12/2021   CHOLHDL 3 08/12/2021   Lab Results  Component Value Date   VD25OH 48.71 08/12/2021   VD25OH 33.92 12/03/2020   VD25OH 37.41 06/11/2020   Lab Results  Component Value Date   WBC 7.3 08/12/2021   HGB 13.3 08/12/2021   HCT 40.7 08/12/2021   MCV 93.2 08/12/2021   PLT 412.0 (H) 08/12/2021   No results found for: "IRON", "TIBC", "FERRITIN"  Attestation Statements:   Reviewed by clinician on day of visit: allergies, medications, problem list, medical history, surgical history, family history, social history, and previous encounter notes.  I, Brendell Tyus, RMA, am acting as transcriptionist for Everardo Pacific, FNP.  I have reviewed the above documentation for accuracy and completeness, and I agree with the above. Everardo Pacific, FNP

## 2021-12-11 ENCOUNTER — Telehealth (INDEPENDENT_AMBULATORY_CARE_PROVIDER_SITE_OTHER): Payer: Self-pay | Admitting: Nurse Practitioner

## 2021-12-22 ENCOUNTER — Other Ambulatory Visit: Payer: Self-pay | Admitting: Nurse Practitioner

## 2021-12-22 ENCOUNTER — Encounter: Payer: Self-pay | Admitting: Family Medicine

## 2021-12-22 ENCOUNTER — Other Ambulatory Visit (HOSPITAL_BASED_OUTPATIENT_CLINIC_OR_DEPARTMENT_OTHER): Payer: Self-pay

## 2021-12-22 ENCOUNTER — Other Ambulatory Visit: Payer: Self-pay | Admitting: Family Medicine

## 2021-12-22 ENCOUNTER — Ambulatory Visit: Payer: BC Managed Care – PPO | Admitting: Nurse Practitioner

## 2021-12-22 ENCOUNTER — Encounter: Payer: Self-pay | Admitting: Nurse Practitioner

## 2021-12-22 VITALS — BP 159/94 | HR 85 | Temp 98.6°F | Ht 66.0 in | Wt 243.0 lb

## 2021-12-22 DIAGNOSIS — E1169 Type 2 diabetes mellitus with other specified complication: Secondary | ICD-10-CM

## 2021-12-22 DIAGNOSIS — F418 Other specified anxiety disorders: Secondary | ICD-10-CM

## 2021-12-22 DIAGNOSIS — F3289 Other specified depressive episodes: Secondary | ICD-10-CM | POA: Diagnosis not present

## 2021-12-22 DIAGNOSIS — E559 Vitamin D deficiency, unspecified: Secondary | ICD-10-CM

## 2021-12-22 DIAGNOSIS — I10 Essential (primary) hypertension: Secondary | ICD-10-CM

## 2021-12-22 DIAGNOSIS — M159 Polyosteoarthritis, unspecified: Secondary | ICD-10-CM

## 2021-12-22 DIAGNOSIS — E669 Obesity, unspecified: Secondary | ICD-10-CM | POA: Diagnosis not present

## 2021-12-22 DIAGNOSIS — Z6839 Body mass index (BMI) 39.0-39.9, adult: Secondary | ICD-10-CM | POA: Diagnosis not present

## 2021-12-22 DIAGNOSIS — F411 Generalized anxiety disorder: Secondary | ICD-10-CM

## 2021-12-22 DIAGNOSIS — Z7985 Long-term (current) use of injectable non-insulin antidiabetic drugs: Secondary | ICD-10-CM

## 2021-12-22 MED ORDER — POTASSIUM CHLORIDE CRYS ER 10 MEQ PO TBCR
10.0000 meq | EXTENDED_RELEASE_TABLET | Freq: Every day | ORAL | 1 refills | Status: DC
  Filled 2021-12-23: qty 90, 90d supply, fill #0
  Filled 2022-07-07: qty 90, 90d supply, fill #1

## 2021-12-22 MED ORDER — BUPROPION HCL ER (XL) 150 MG PO TB24
150.0000 mg | ORAL_TABLET | Freq: Every day | ORAL | 0 refills | Status: DC
  Filled 2021-12-22: qty 30, 30d supply, fill #0

## 2021-12-22 MED ORDER — LORAZEPAM 1 MG PO TABS
1.0000 mg | ORAL_TABLET | Freq: Every day | ORAL | 1 refills | Status: DC
  Filled 2021-12-23: qty 30, 30d supply, fill #0

## 2021-12-22 MED ORDER — AMLODIPINE BESYLATE 10 MG PO TABS
10.0000 mg | ORAL_TABLET | Freq: Every day | ORAL | 1 refills | Status: DC
  Filled 2021-12-23: qty 90, 90d supply, fill #0
  Filled 2022-07-07: qty 90, 90d supply, fill #1

## 2021-12-22 MED ORDER — ROSUVASTATIN CALCIUM 10 MG PO TABS
10.0000 mg | ORAL_TABLET | Freq: Every day | ORAL | 1 refills | Status: DC
  Filled 2021-12-22: qty 90, 90d supply, fill #0
  Filled 2022-07-07: qty 90, 90d supply, fill #1

## 2021-12-22 MED ORDER — NEBIVOLOL HCL 10 MG PO TABS
10.0000 mg | ORAL_TABLET | Freq: Every day | ORAL | 1 refills | Status: DC
  Filled 2021-12-22: qty 90, 90d supply, fill #0
  Filled 2022-07-07: qty 90, 90d supply, fill #1

## 2021-12-22 MED ORDER — OZEMPIC (0.25 OR 0.5 MG/DOSE) 2 MG/3ML ~~LOC~~ SOPN
0.5000 mg | PEN_INJECTOR | SUBCUTANEOUS | 0 refills | Status: DC
  Filled 2021-12-22: qty 3, 28d supply, fill #0

## 2021-12-22 MED ORDER — ESCITALOPRAM OXALATE 20 MG PO TABS
20.0000 mg | ORAL_TABLET | Freq: Every day | ORAL | 1 refills | Status: DC
  Filled 2021-12-22: qty 90, 90d supply, fill #0

## 2021-12-22 MED ORDER — FUROSEMIDE 20 MG PO TABS
20.0000 mg | ORAL_TABLET | Freq: Every day | ORAL | 1 refills | Status: DC
  Filled 2021-12-23: qty 90, 90d supply, fill #0
  Filled 2022-07-07: qty 90, 90d supply, fill #1

## 2021-12-22 NOTE — Telephone Encounter (Signed)
Requesting: lorazepam '1mg'$   Contract: 08/12/21 UDS: 08/12/21 Last Visit: 08/12/21 Next Visit: None Last Refill: 08/12/21 #30 and 0RF  Please Advise

## 2021-12-23 ENCOUNTER — Other Ambulatory Visit (HOSPITAL_BASED_OUTPATIENT_CLINIC_OR_DEPARTMENT_OTHER): Payer: Self-pay

## 2021-12-23 ENCOUNTER — Ambulatory Visit: Payer: BC Managed Care – PPO | Admitting: Nurse Practitioner

## 2021-12-29 ENCOUNTER — Other Ambulatory Visit (HOSPITAL_BASED_OUTPATIENT_CLINIC_OR_DEPARTMENT_OTHER): Payer: Self-pay

## 2021-12-30 NOTE — Progress Notes (Signed)
Chief Complaint:   OBESITY [name] is here to discuss her progress with her obesity treatment plan along with follow-up of her obesity related diagnoses. [name] is on keeping a food journal and adhering to recommended goals of 1200 calories and 75 grams of protein and states she is following her eating plan approximately 70% of the time. [name] states she is exercising 0 minutes 0 times per week.  Today's visit was #: 26 Starting weight: 240 lbs Starting date: 01/15/2020 Today's weight: 243 lbs Today's date: 12/22/2021 Total lbs lost to date: 0 Total lbs lost since last in-office visit: 0  Interim History: Celebrated Thanksgiving and notes got off track. Worried about celebration coming up at school.  Subjective:   1. Type 2 diabetes mellitus with other specified complication, without long-term current use of insulin ([name]) Luv taking Ozempic 0.25 mg. Denies any side effects. Denies hypoglycemia.  2. Other Depression with emotional eating [name] is taking Wellbutrin XL 300 mg. Denies any side effects. Helpful with mood, unsure if helps with cravings. Wants to decrease dose.  Assessment/Plan:   1. Type 2 diabetes mellitus with other specified complication, without long-term current use of insulin (HCC) We will refill Ozempic 0.5 mg SQ once a week for 1 month with 0 refills. Side effects discussed.   Good blood sugar control is important to decrease the likelihood of diabetic complications such as nephropathy, neuropathy, limb loss, blindness, coronary artery disease, and death. Intensive lifestyle modification including diet, exercise and weight loss are the first line of treatment for diabetes.    -Refill Semaglutide,0.25 or 0.'5MG'$ /DOS, (OZEMPIC, 0.25 OR 0.5 MG/DOSE,) 2 MG/3ML SOPN; Inject 0.5 mg into the skin once a week.  Dispense: 3 mL; Refill: 0  2. Other Depression with emotional eating We will refill Wellbutrin XL 150 mg daily for 1 month with 0 refills. Side effects  discussed.   -Refill buPROPion (WELLBUTRIN XL) 150 MG 24 hr tablet; Take 1 tablet (150 mg total) by mouth daily.  Dispense: 30 tablet; Refill: 0  3. Obesity, current BMI 39.3 [name] is currently in the action stage of change. As such, her goal is to continue with weight loss efforts. She has agreed to practicing portion control and making smarter food choices, such as increasing vegetables and decreasing simple carbohydrates.   Will check labs at next visit: A1c, CMP, Vit D  Exercise goals: All adults should avoid inactivity. Some physical activity is better than none, and adults who participate in any amount of physical activity gain some health benefits.  Behavioral modification strategies: increasing lean protein intake, increasing vegetables, increasing water intake, and holiday eating strategies .  [name] has agreed to follow-up with our clinic in 3 weeks. She was informed of the importance of frequent follow-up visits to maximize her success with intensive lifestyle modifications for her multiple health conditions.   Objective:   Blood pressure (!) 159/94, pulse 85, temperature 98.6 F (37 C), temperature source Oral, height '5\' 6"'$  (1.676 m), weight 243 lb (110.2 kg), last menstrual period 08/13/2014, SpO2 100 %. Body mass index is 39.22 kg/m.  General: Cooperative, alert, well developed, in no acute distress. HEENT: Conjunctivae and lids unremarkable. Cardiovascular: Regular rhythm.  Lungs: Normal work of breathing. Neurologic: No focal deficits.   Lab Results  Component Value Date   CREATININE 0.73 08/12/2021   BUN 13 08/12/2021   NA 138 08/12/2021   K 4.4 08/12/2021   CL 103 08/12/2021   CO2 29 08/12/2021   Lab Results  Component Value Date   ALT 10 08/12/2021   AST 11 08/12/2021   ALKPHOS 68 08/12/2021   BILITOT 0.5 08/12/2021   Lab Results  Component Value Date   HGBA1C 6.0 08/12/2021   HGBA1C 6.5 12/03/2020   HGBA1C 6.1 06/11/2020   HGBA1C 6.0 (H) 08/03/2019    HGBA1C 6.1 08/13/2017   Lab Results  Component Value Date   INSULIN 19.2 08/03/2019   INSULIN 12.9 11/05/2016   Lab Results  Component Value Date   TSH 2.47 08/12/2021   Lab Results  Component Value Date   CHOL 149 08/12/2021   HDL 42.90 08/12/2021   LDLCALC 79 08/12/2021   TRIG 136.0 08/12/2021   CHOLHDL 3 08/12/2021   Lab Results  Component Value Date   VD25OH 48.71 08/12/2021   VD25OH 33.92 12/03/2020   VD25OH 37.41 06/11/2020   Lab Results  Component Value Date   WBC 7.3 08/12/2021   HGB 13.3 08/12/2021   HCT 40.7 08/12/2021   MCV 93.2 08/12/2021   PLT 412.0 (H) 08/12/2021   No results found for: "IRON", "TIBC", "FERRITIN"  Attestation Statements:   Reviewed by clinician on day of visit: allergies, medications, problem list, medical history, surgical history, family history, social history, and previous encounter notes.  I, Brendell Tyus, RMA, am acting as transcriptionist for Everardo Pacific, FNP.  I have reviewed the above documentation for accuracy and completeness, and I agree with the above. Everardo Pacific, FNP

## 2022-01-14 ENCOUNTER — Ambulatory Visit: Payer: BC Managed Care – PPO | Admitting: Nurse Practitioner

## 2022-01-21 ENCOUNTER — Encounter (INDEPENDENT_AMBULATORY_CARE_PROVIDER_SITE_OTHER): Payer: Self-pay | Admitting: Adult Health

## 2022-01-21 ENCOUNTER — Encounter: Payer: Self-pay | Admitting: Family Medicine

## 2022-01-21 ENCOUNTER — Telehealth: Payer: Self-pay | Admitting: Family Medicine

## 2022-01-21 NOTE — Telephone Encounter (Signed)
Pt called stating that she would like to have z-pak called in for her congestion and cough that she has. She stated she had taken a negative covid test on 12.26.23. Advised that an appt would probably be best to ensure that the z-pak would be the proper treatment for this. Pt stated that she has called it in before and wanted to see if it could be called in without an appt. Advised pt that a note would be sent back to look into this matter for her. Pt acknowledged understanding and stated she would like it sent to the following pharmacy:  [facility] [address], [place], [place] [address] Phone: [phone]  Fax: [phone]

## 2022-01-21 NOTE — Telephone Encounter (Signed)
Per [name], pt needs a at least a virtual please

## 2022-01-22 NOTE — Telephone Encounter (Signed)
Pt called and advised to make appt with [facility] due to [place] being fully booked

## 2022-02-03 ENCOUNTER — Other Ambulatory Visit (HOSPITAL_BASED_OUTPATIENT_CLINIC_OR_DEPARTMENT_OTHER): Payer: Self-pay

## 2022-02-03 ENCOUNTER — Ambulatory Visit: Payer: BC Managed Care – PPO | Admitting: Nurse Practitioner

## 2022-02-03 ENCOUNTER — Encounter: Payer: Self-pay | Admitting: Nurse Practitioner

## 2022-02-03 VITALS — BP 128/83 | HR 87 | Temp 98.6°F | Ht 66.0 in | Wt 239.0 lb

## 2022-02-03 DIAGNOSIS — Z6838 Body mass index (BMI) 38.0-38.9, adult: Secondary | ICD-10-CM

## 2022-02-03 DIAGNOSIS — E669 Obesity, unspecified: Secondary | ICD-10-CM | POA: Diagnosis not present

## 2022-02-03 DIAGNOSIS — E1169 Type 2 diabetes mellitus with other specified complication: Secondary | ICD-10-CM

## 2022-02-03 DIAGNOSIS — Z7985 Long-term (current) use of injectable non-insulin antidiabetic drugs: Secondary | ICD-10-CM

## 2022-02-03 DIAGNOSIS — F3289 Other specified depressive episodes: Secondary | ICD-10-CM | POA: Diagnosis not present

## 2022-02-03 DIAGNOSIS — E559 Vitamin D deficiency, unspecified: Secondary | ICD-10-CM

## 2022-02-03 MED ORDER — OZEMPIC (0.25 OR 0.5 MG/DOSE) 2 MG/3ML ~~LOC~~ SOPN
0.5000 mg | PEN_INJECTOR | SUBCUTANEOUS | 0 refills | Status: DC
  Filled 2022-02-03: qty 3, 28d supply, fill #0

## 2022-02-03 MED ORDER — VITAMIN D (ERGOCALCIFEROL) 1.25 MG (50000 UNIT) PO CAPS
50000.0000 [IU] | ORAL_CAPSULE | ORAL | 0 refills | Status: DC
  Filled 2022-02-03: qty 4, 28d supply, fill #0

## 2022-02-03 MED ORDER — BUPROPION HCL ER (XL) 150 MG PO TB24
150.0000 mg | ORAL_TABLET | Freq: Every day | ORAL | 0 refills | Status: DC
  Filled 2022-02-03: qty 30, 30d supply, fill #0

## 2022-02-10 NOTE — Progress Notes (Signed)
Chief Complaint:   OBESITY [name] is here to discuss her progress with her obesity treatment plan along with follow-up of her obesity related diagnoses. [name] is on practicing portion control and making smarter food choices, such as increasing vegetables and decreasing simple carbohydrates and states she is following her eating plan approximately 80% of the time. [name] states she is going to the gym/strengthening/ cardio  30 minutes 3 times per week.  Today's visit was #: 27 Starting weight: 240 lbs Starting date: 01/15/2020 Today's weight: 239 lbs Today's date: 02/03/2022 Total lbs lost to date: 1 lb Total lbs lost since last in-office visit: 4  Interim History: [name] has done well with weight loss since last visit. Following PC/[place] plan, limiting carbs.  Started working out with a personal trainer--last week.  Plans to exercise with her 3 day a week.  Hunger and cravings have decreased since she started working out.     Subjective:   1. Vitamin D deficiency [name] is currently taking prescription Vit D 50,000 IU once a week.  Denies any nausea, vomiting or muscle weakness.  2. Other Depression with emotional eating [name] is taking Wellbutrin XL 150 mg.  Denies any side effects.  3. Type 2 diabetes mellitus with other specified complication, without long-term current use of insulin (HCC) [name] is taking Ozempic 0.5 --Denies any side effects. Denies hypoglycemia.  Last eye exam 12/23.   Assessment/Plan:   1. Vitamin D deficiency We will refill Vit D 50K IU once a week for 1 month with 0 refills.  Side effects discussed.   -Refill Vitamin D, Ergocalciferol, (DRISDOL) 1.25 MG (50000 UNIT) CAPS capsule; Take 1 capsule (50,000 Units total) by mouth every 7 (seven) days.  Dispense: 4 capsule; Refill: 0  2. Other Depression with emotional eating We will refill Wellbutrin XL 150 mg daily for 1 month wit 0 refills.  Side effects discussed.   -Refill buPROPion (WELLBUTRIN XL) 150 MG 24  hr tablet; Take 1 tablet (150 mg total) by mouth daily.  Dispense: 30 tablet; Refill: 0  3. Type 2 diabetes mellitus with other specified complication, without long-term current use of insulin (HCC) We will refill Ozempic 0.5 mg SubQ once weekly for 1 month with 0 refills.  Side effects discussed.  Follow up with PCP for labs.  -Refill Semaglutide,0.25 or 0.'5MG'$ /DOS, (OZEMPIC, 0.25 OR 0.5 MG/DOSE,) 2 MG/3ML SOPN; Inject 0.5 mg into the skin once a week.  Dispense: 3 mL; Refill: 0  4. Obesity, current BMI 38.6 [name] is currently in the action stage of change. As such, her goal is to continue with weight loss efforts. She has agreed to practicing portion control and making smarter food choices, such as increasing vegetables and decreasing simple carbohydrates.   Plans to schedule a physical and labs with PCP.  Exercise goals: As is.  Behavioral modification strategies: increasing lean protein intake, increasing water intake, and no skipping meals.  [name] has agreed to follow-up with our clinic in 4 weeks. She was informed of the importance of frequent follow-up visits to maximize her success with intensive lifestyle modifications for her multiple health conditions.   Objective:   Blood pressure 128/83, pulse 87, temperature 98.6 F (37 C), height '5\' 6"'$  (1.676 m), weight 239 lb (108.4 kg), last menstrual period 08/13/2014, SpO2 99 %. Body mass index is 38.58 kg/m.  General: Cooperative, alert, well developed, in no acute distress. HEENT: Conjunctivae and lids unremarkable. Cardiovascular: Regular rhythm.  Lungs: Normal work of breathing. Neurologic:  No focal deficits.   Lab Results  Component Value Date   CREATININE 0.73 08/12/2021   BUN 13 08/12/2021   NA 138 08/12/2021   K 4.4 08/12/2021   CL 103 08/12/2021   CO2 29 08/12/2021   Lab Results  Component Value Date   ALT 10 08/12/2021   AST 11 08/12/2021   ALKPHOS 68 08/12/2021   BILITOT 0.5 08/12/2021   Lab Results   Component Value Date   HGBA1C 6.0 08/12/2021   HGBA1C 6.5 12/03/2020   HGBA1C 6.1 06/11/2020   HGBA1C 6.0 (H) 08/03/2019   HGBA1C 6.1 08/13/2017   Lab Results  Component Value Date   INSULIN 19.2 08/03/2019   INSULIN 12.9 11/05/2016   Lab Results  Component Value Date   TSH 2.47 08/12/2021   Lab Results  Component Value Date   CHOL 149 08/12/2021   HDL 42.90 08/12/2021   LDLCALC 79 08/12/2021   TRIG 136.0 08/12/2021   CHOLHDL 3 08/12/2021   Lab Results  Component Value Date   VD25OH 48.71 08/12/2021   VD25OH 33.92 12/03/2020   VD25OH 37.41 06/11/2020   Lab Results  Component Value Date   WBC 7.3 08/12/2021   HGB 13.3 08/12/2021   HCT 40.7 08/12/2021   MCV 93.2 08/12/2021   PLT 412.0 (H) 08/12/2021   No results found for: "IRON", "TIBC", "FERRITIN"  Attestation Statements:   Reviewed by clinician on day of visit: allergies, medications, problem list, medical history, surgical history, family history, social history, and previous encounter notes.  I, Brendell Tyus, RMA, am acting as transcriptionist for Everardo Pacific, FNP.  I have reviewed the above documentation for accuracy and completeness, and I agree with the above. Everardo Pacific, FNP

## 2022-03-02 ENCOUNTER — Ambulatory Visit: Payer: BC Managed Care – PPO | Admitting: Nurse Practitioner

## 2022-03-02 ENCOUNTER — Other Ambulatory Visit (HOSPITAL_BASED_OUTPATIENT_CLINIC_OR_DEPARTMENT_OTHER): Payer: Self-pay

## 2022-03-02 ENCOUNTER — Encounter: Payer: Self-pay | Admitting: Nurse Practitioner

## 2022-03-02 VITALS — BP 158/89 | HR 74 | Temp 98.5°F | Ht 66.0 in | Wt 234.0 lb

## 2022-03-02 DIAGNOSIS — E559 Vitamin D deficiency, unspecified: Secondary | ICD-10-CM

## 2022-03-02 DIAGNOSIS — E669 Obesity, unspecified: Secondary | ICD-10-CM | POA: Insufficient documentation

## 2022-03-02 DIAGNOSIS — E1169 Type 2 diabetes mellitus with other specified complication: Secondary | ICD-10-CM

## 2022-03-02 DIAGNOSIS — F3289 Other specified depressive episodes: Secondary | ICD-10-CM

## 2022-03-02 DIAGNOSIS — Z7985 Long-term (current) use of injectable non-insulin antidiabetic drugs: Secondary | ICD-10-CM

## 2022-03-02 DIAGNOSIS — Z6837 Body mass index (BMI) 37.0-37.9, adult: Secondary | ICD-10-CM

## 2022-03-02 MED ORDER — OZEMPIC (0.25 OR 0.5 MG/DOSE) 2 MG/3ML ~~LOC~~ SOPN
0.5000 mg | PEN_INJECTOR | SUBCUTANEOUS | 0 refills | Status: DC
  Filled 2022-03-02: qty 3, 28d supply, fill #0

## 2022-03-02 MED ORDER — BUPROPION HCL ER (XL) 150 MG PO TB24
150.0000 mg | ORAL_TABLET | Freq: Every day | ORAL | 0 refills | Status: DC
  Filled 2022-03-02: qty 30, 30d supply, fill #0

## 2022-03-02 MED ORDER — VITAMIN D (ERGOCALCIFEROL) 1.25 MG (50000 UNIT) PO CAPS
50000.0000 [IU] | ORAL_CAPSULE | ORAL | 0 refills | Status: DC
  Filled 2022-03-02: qty 4, 28d supply, fill #0

## 2022-03-02 NOTE — Progress Notes (Addendum)
Office: [phone]  /  Fax: [phone]  WEIGHT SUMMARY AND BIOMETRICS  Medical Weight Loss Height: '5\' 6"'$  (1.676 m) Temp: 98.5 F (36.9 C) Pulse Rate: 74 BP: (!) 158/89 SpO2: 99 %  Body Fat %: 46.1 % Fat Mass (lbs): 108 lbs Muscle Mass (lbs): 119.8 lbs Total Body Water (lbs): 83.2 lbs Visceral Fat Rating : 13  Today's visit was #: 28 Starting weight: 240 lbs Starting date: 01/15/2020 Today's weight: 234 lbs Today's date: 03/02/22 Total lbs lost to date: 6 lb Total lbs lost since last in-office visit: 5  HPI  Chief Complaint: OBESITY  [name] is here to discuss her progress with her obesity treatment plan. She is on the practicing portion control and making smarter food choices, such as increasing vegetables and decreasing simple carbohydrates and states she is following her eating plan approximately 100 % of the time. She states she is exercising 30-60  minutes 3 times per week.   Interval History:  Since last office visit she  has done well with weight loss. She has lost 5 pounds since her last visit. She is averaging around 1200 cal and greater than 120 grams of protein.  She is working out with a Physiological scientist.   Pharmacotherapy: She is taking Ozempic 0.5 mg.  Denies side effects.  PHYSICAL EXAM:  Blood pressure (!) 158/89, pulse 74, temperature 98.5 F (36.9 C), height '5\' 6"'$  (1.676 m), weight 234 lb (106.1 kg), last menstrual period 08/13/2014, SpO2 99 %. Body mass index is 37.77 kg/m.  General: She is overweight, cooperative, alert, well developed, and in no acute distress. PSYCH: Has normal mood, affect and thought process.   Extremities: No edema.  Neurologic: No gross sensory or motor deficits. No tremors or fasciculations noted.    DIAGNOSTIC DATA REVIEWED:  BMET    Component Value Date/Time   NA 138 08/12/2021 0937   NA 140 08/03/2019 1356   K 4.4 08/12/2021 0937   CL 103 08/12/2021 0937   CO2 29 08/12/2021 0937   GLUCOSE 87 08/12/2021 0937    BUN 13 08/12/2021 0937   BUN 10 08/03/2019 1356   CREATININE 0.73 08/12/2021 0937   CREATININE 0.81 05/01/2013 1105   CALCIUM 9.5 08/12/2021 0937   GFRNONAA 106 08/03/2019 1356   GFRAA 123 08/03/2019 1356   Lab Results  Component Value Date   HGBA1C 6.0 08/12/2021   HGBA1C 5.8 (H) 11/05/2016   Lab Results  Component Value Date   INSULIN 19.2 08/03/2019   INSULIN 12.9 11/05/2016   Lab Results  Component Value Date   TSH 2.47 08/12/2021   CBC    Component Value Date/Time   WBC 7.3 08/12/2021 0937   RBC 4.37 08/12/2021 0937   HGB 13.3 08/12/2021 0937   HGB 13.4 11/05/2016 0948   HCT 40.7 08/12/2021 0937   HCT 40.1 11/05/2016 0948   PLT 412.0 (H) 08/12/2021 0937   MCV 93.2 08/12/2021 0937   MCV 93 11/05/2016 0948   MCH 31.1 11/05/2016 0948   MCH 27.2 08/31/2014 0440   MCHC 32.7 08/12/2021 0937   RDW 15.3 08/12/2021 0937   RDW 14.5 11/05/2016 0948   Iron Studies No results found for: "IRON", "TIBC", "FERRITIN", "IRONPCTSAT" Lipid Panel     Component Value Date/Time   CHOL 149 08/12/2021 0937   CHOL 168 11/05/2016 0948   TRIG 136.0 08/12/2021 0937   HDL 42.90 08/12/2021 0937   HDL 53 11/05/2016 0948   CHOLHDL 3 08/12/2021 0937   VLDL 27.2  08/12/2021 0937   LDLCALC 79 08/12/2021 0937   LDLCALC 101 (H) 11/05/2016 0948   Hepatic Function Panel     Component Value Date/Time   PROT 7.0 08/12/2021 0937   PROT 6.7 08/03/2019 1356   ALBUMIN 4.5 08/12/2021 0937   ALBUMIN 4.1 08/03/2019 1356   AST 11 08/12/2021 0937   ALT 10 08/12/2021 0937   ALKPHOS 68 08/12/2021 0937   BILITOT 0.5 08/12/2021 0937   BILITOT 0.2 08/03/2019 1356      Component Value Date/Time   TSH 2.47 08/12/2021 0937   Nutritional Lab Results  Component Value Date   VD25OH 48.71 08/12/2021   VD25OH 33.92 12/03/2020   VD25OH 37.41 06/11/2020     ASSESSMENT AND PLAN  TREATMENT PLAN FOR OBESITY:  Recommended Dietary Goals  [name] is currently in the action stage of change. As  such, her goal is to continue weight management plan. She has agreed to practicing portion control and making smarter food choices, such as increasing vegetables and decreasing simple carbohydrates.  Behavioral Intervention  We discussed the following Behavioral Modification Strategies today: increasing lean protein intake, decreasing simple carbohydrates , increasing vegetables, avoid skipping meals, and decrease eating out.  Additional resources provided today: NA  Recommended Physical Activity Goals  [name] has been advised to work up to 150 minutes of moderate intensity aerobic activity a week and strengthening exercises 2-3 times per week for cardiovascular health, weight loss maintenance and preservation of muscle mass.   She has agreed to increase physical activity in their day and reduce sedentary time (increase NEAT).  and continue physical activity as is.    Pharmacotherapy We discussed various medication options to help [name] with her weight loss efforts and we both agreed to continue Ozempic 0.'5mg'$ .  ASSOCIATED CONDITIONS ADDRESSED TODAY  Type 2 diabetes mellitus with other specified complication, without long-term current use of insulin (HCC) She is currently taking Ozempic 0.5 mg.  Denies side effects.  Good blood sugar control is important to decrease the likelihood of diabetic complications such as nephropathy, neuropathy, limb loss, blindness, coronary artery disease, and death. Intensive lifestyle modification including diet, exercise and weight loss are the first line of treatment for diabetes.   -     Ozempic (0.25 or 0.5 MG/DOSE); Inject 0.5 mg into the skin once a week.  Dispense: 3 mL; Refill: 0  Vitamin D deficiency Currently taking vitamin D 50,000 IU weekly.  Denies side effects.  Denies nausea vomiting or muscle weakness. -     Vitamin D (Ergocalciferol); Take 1 capsule (50,000 Units total) by mouth every 7 (seven) days.  Dispense: 4 capsule; Refill: 0  Other  Depression with emotional eating Currently doing well taking Wellbutrin XL 150 mg.  Denies side effects.  -     buPROPion HCl ER (XL); Take 1 tablet (150 mg total) by mouth daily.  Dispense: 30 tablet; Refill: 0  Morbid obesity (HCC)  BMI 37.0-37.9, adult      Return in about 4 weeks (around 03/30/2022).Marland Kitchen She was informed of the importance of frequent follow up visits to maximize her success with intensive lifestyle modifications for her multiple health conditions.   ATTESTASTION STATEMENTS:  Reviewed by clinician on day of visit: allergies, medications, problem list, medical history, surgical history, family history, social history, and previous encounter notes.    Ailene Rud. Jurnee Nakayama FNP-C

## 2022-03-07 ENCOUNTER — Other Ambulatory Visit: Payer: Self-pay

## 2022-03-07 ENCOUNTER — Encounter (HOSPITAL_BASED_OUTPATIENT_CLINIC_OR_DEPARTMENT_OTHER): Payer: Self-pay | Admitting: Emergency Medicine

## 2022-03-07 ENCOUNTER — Emergency Department (HOSPITAL_BASED_OUTPATIENT_CLINIC_OR_DEPARTMENT_OTHER): Payer: BC Managed Care – PPO

## 2022-03-07 ENCOUNTER — Emergency Department (HOSPITAL_BASED_OUTPATIENT_CLINIC_OR_DEPARTMENT_OTHER)
Admission: EM | Admit: 2022-03-07 | Discharge: 2022-03-07 | Disposition: A | Discharge status: Home / Self Care | Payer: BC Managed Care – PPO | Attending: Emergency Medicine | Admitting: Emergency Medicine

## 2022-03-07 DIAGNOSIS — Y92512 Supermarket, store or market as the place of occurrence of the external cause: Secondary | ICD-10-CM | POA: Insufficient documentation

## 2022-03-07 DIAGNOSIS — M25521 Pain in right elbow: Secondary | ICD-10-CM

## 2022-03-07 DIAGNOSIS — Z79899 Other long term (current) drug therapy: Secondary | ICD-10-CM | POA: Insufficient documentation

## 2022-03-07 DIAGNOSIS — W19XXXA Unspecified fall, initial encounter: Secondary | ICD-10-CM

## 2022-03-07 DIAGNOSIS — I10 Essential (primary) hypertension: Secondary | ICD-10-CM | POA: Diagnosis not present

## 2022-03-07 DIAGNOSIS — M25562 Pain in left knee: Secondary | ICD-10-CM

## 2022-03-07 DIAGNOSIS — E119 Type 2 diabetes mellitus without complications: Secondary | ICD-10-CM | POA: Diagnosis not present

## 2022-03-07 DIAGNOSIS — W010XXA Fall on same level from slipping, tripping and stumbling without subsequent striking against object, initial encounter: Secondary | ICD-10-CM | POA: Diagnosis not present

## 2022-03-07 DIAGNOSIS — M549 Dorsalgia, unspecified: Secondary | ICD-10-CM | POA: Diagnosis present

## 2022-03-07 DIAGNOSIS — M545 Low back pain, unspecified: Secondary | ICD-10-CM | POA: Diagnosis not present

## 2022-03-07 MED ORDER — METHOCARBAMOL 500 MG PO TABS: 500.0000 mg | ORAL_TABLET | Freq: Two times a day (BID) | ORAL | 0 refills | Status: DC

## 2022-03-07 MED ORDER — IBUPROFEN 800 MG PO TABS
800.0000 mg | ORAL_TABLET | Freq: Once | ORAL | Status: AC
  Administered 2022-03-07: 800 mg via ORAL
  Filled 2022-03-07: qty 1

## 2022-03-07 NOTE — Discharge Instructions (Signed)
You have been seen today for your complaint of fall. Your imaging was reassuring. Your discharge medications include Alternate tylenol and ibuprofen for pain. You may alternate these every 4 hours. You may take up to 800 mg of ibuprofen at a time and up to 1000 mg of tylenol. Robaxin.  This is a muscle relaxant.  You should not drive or operate heavy machinery while taking this medication.  you should only take it at night until you know how it affects you. Follow up with: Your orthopedic provider as scheduled Please seek immediate medical care if you develop any of the following symptoms: You develop new bowel or bladder control problems. You have unusual weakness or numbness in your arms or legs. You feel faint. At this time there does not appear to be the presence of an emergent medical condition, however there is always the potential for conditions to change. Please read and follow the below instructions.  Do not take your medicine if  develop an itchy rash, swelling in your mouth or lips, or difficulty breathing; call 911 and seek immediate emergency medical attention if this occurs.  You may review your lab tests and imaging results in their entirety on your MyChart account.  Please discuss all results of fully with your primary care provider and other specialist at your follow-up visit.  Note: Portions of this text may have been transcribed using voice recognition software. Every effort was made to ensure accuracy; however, inadvertent computerized transcription errors may still be present.

## 2022-03-07 NOTE — ED Triage Notes (Signed)
Pt reports she fell on Wednesday and is having bilateral lower back pain. Could not get into providers until next week but wanted to be seen earlier than that. Also complaining of L knee and R elbow pain

## 2022-03-07 NOTE — ED Provider Notes (Signed)
[facility] Provider Note   CSN: [id number] Arrival date & time: 03/07/22  0935     History  Chief Complaint  Patient presents with   Back Pain    [name] is a 52 y.o. female.  With history of hypertension, anemia, anxiety, depression, diabetes who presents to the ED for evaluation of a fall.  She states that approximately 3 days ago she was in the grocery store and slipped on a tomato.  She fell and landed on her buttocks.  She did not hit her head or lose consciousness.  She does not take any blood thinners.  She states she also landed on her right holdable and twisted her left knee.  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  She denies numbness, weakness or tingling.  She reports her pain is mostly in the right lower back.  Denies fevers or saddle paresthesias.  Currently rates the pain at a 6 or 7 out of 10.  Has not taken anything for her pain.  She states she called her orthopedic office and has an appointment scheduled for Tuesday but wanted to come in to be seen sooner.  She has not taken anything for her pain today.   Back Pain      Home Medications Prior to Admission medications   Medication Sig Start Date End Date Taking? Authorizing Provider  methocarbamol (ROBAXIN) 500 MG tablet Take 1 tablet (500 mg total) by mouth 2 (two) times daily. 03/07/22  Yes Jodee Wagenaar, [place] Folk, PA-C  amLODipine (NORVASC) 10 MG tablet Take 1 tablet (10 mg total) by mouth daily. 12/22/21   Ann Held, DO  buPROPion (WELLBUTRIN XL) 150 MG 24 hr tablet Take 1 tablet (150 mg total) by mouth daily. 03/02/22   Everardo Pacific, FNP  celecoxib (CELEBREX) 200 MG capsule Take by mouth 2 (two) times daily. 03/14/21   [provider]  escitalopram (LEXAPRO) 20 MG tablet Take 1 tablet (20 mg total) by mouth daily. 12/22/21   Ann Held, DO  furosemide (LASIX) 20 MG tablet Take 1 tablet (20  mg total) by mouth daily. 12/22/21   Roma Schanz R, DO  LORazepam (ATIVAN) 1 MG tablet Take 1 tablet (1 mg total) by mouth at bedtime. 12/22/21   Kennyth Arnold, FNP  nebivolol (BYSTOLIC) 10 MG tablet Take 1 tablet (10 mg total) by mouth daily. 12/22/21   Roma Schanz R, DO  ondansetron (ZOFRAN) 4 MG tablet Take 1 tablet (4 mg total) by mouth every 8 (eight) hours as needed for nausea or vomiting. 11/11/21   Debbrah Alar, NP  potassium chloride (KLOR-CON M) 10 MEQ tablet Take 1 tablet (10 mEq total) by mouth daily. 12/22/21   Ann Held, DO  rosuvastatin (CRESTOR) 10 MG tablet Take 1 tablet (10 mg total) by mouth at bedtime. 12/22/21   Ann Held, DO  Semaglutide,0.25 or 0.5MG/DOS, (OZEMPIC, 0.25 OR 0.5 MG/DOSE,) 2 MG/3ML SOPN Inject 0.5 mg into the skin once a week. 03/02/22   Everardo Pacific, FNP  SUMAtriptan (IMITREX) 50 MG tablet May repeat in 2 hours if headache persists or recurs. 11/11/21   Debbrah Alar, NP  Vitamin D, Ergocalciferol, (DRISDOL) 1.25 MG (50000 UNIT) CAPS capsule Take 1 capsule (50,000 Units total) by mouth every 7 (seven) days. 03/02/22   Everardo Pacific, FNP      Allergies    Hydrocodone and Lisinopril  Review of Systems   Review of Systems  Musculoskeletal:  Positive for arthralgias and back pain.  All other systems reviewed and are negative.   Physical Exam Updated Vital Signs BP (!) 145/97   Pulse 87   Temp 98.5 F (36.9 C) (Oral)   Resp 16   LMP 08/13/2014   SpO2 100%  Physical Exam Vitals and nursing note reviewed.  Constitutional:      General: She is not in acute distress.    Appearance: She is well-developed.  HENT:     Head: Normocephalic and atraumatic.  Eyes:     Conjunctiva/sclera: Conjunctivae normal.  Cardiovascular:     Rate and Rhythm: Normal rate and regular rhythm.     Heart sounds: No murmur heard. Pulmonary:     Effort: Pulmonary effort is normal. No respiratory  distress.  Abdominal:     Palpations: Abdomen is soft.  Musculoskeletal:        General: No swelling.     Cervical back: Neck supple.     Right lower leg: No edema.     Left lower leg: No edema.     Comments: Tenderness to palpation of the right elbow and left knee.  No obvious deformities.  Range of motion full.  Sensation intact.  Strength 5 out of 5 in all extremities.  Mild tenderness to palpation of the right lower back.  No midline C, T or L-spine tenderness  Skin:    General: Skin is warm and dry.     Capillary Refill: Capillary refill takes less than 2 seconds.  Neurological:     Mental Status: She is alert.  Psychiatric:        Mood and Affect: Mood normal.     ED Results / Procedures / Treatments   Labs (all labs ordered are listed, but only abnormal results are displayed) Labs Reviewed - No data to display  EKG None  Radiology DG Elbow Complete Right  Result Date: 03/07/2022 CLINICAL DATA:  Right elbow pain after fall on Wednesday. EXAM: RIGHT ELBOW - COMPLETE 3+ VIEW COMPARISON:  None Available. FINDINGS: There is no evidence of fracture, dislocation, or joint effusion. Normal joint spaces and alignment. There is no evidence of arthropathy or other focal bone abnormality. Soft tissues are unremarkable. IMPRESSION: Negative radiographs of the right elbow. Electronically Signed   By: Keith Rake M.D.   On: 03/07/2022 10:54   DG Knee Complete 4 Views Left  Result Date: 03/07/2022 CLINICAL DATA:  Left knee pain after fall on Wednesday. EXAM: LEFT KNEE - COMPLETE 4+ VIEW COMPARISON:  Preoperative radiograph 04/08/2018 FINDINGS: Left knee arthroplasty in expected alignment. No acute or periprosthetic fracture. There is no periprosthetic lucency. Slight patellar Baja. No significant knee joint effusion. No focal soft tissue abnormalities. IMPRESSION: 1. Left knee arthroplasty without acute or periprosthetic fracture. 2. Slight patellar Baja. Electronically Signed   By:  Keith Rake M.D.   On: 03/07/2022 10:53    Procedures Procedures    Medications Ordered in ED Medications  ibuprofen (ADVIL) tablet 800 mg (800 mg Oral Given 03/07/22 1054)    ED Course/ Medical Decision Making/ A&P                             Medical Decision Making Amount and/or Complexity of Data Reviewed Radiology: ordered.  Risk Prescription drug management.  This patient presents to the ED for concern of fall, back pain, right elbow pain, left  knee pain, this involves an extensive number of treatment options, and is a complaint that carries with it a high risk of complications and morbidity.  The differential diagnosis includes fracture, sprain, dislocation, contusion  Co morbidities that complicate the patient evaluation  hypertension, anemia, anxiety, depression, diabetes  My initial workup includes x-ray right elbow and left knee  Additional history obtained from: Nursing notes from this visit.  I ordered imaging studies including x-ray right elbow and left knee I independently visualized and interpreted imaging which showed slight patella baja, otherwise negative I agree with the radiologist interpretation  Afebrile, slightly hypertensive but otherwise hemodynamically stable.  52 year old female presenting to the ED for evaluation of a fall.  She has mild right-sided low back pain, right elbow pain and left knee pain.  Her physical exam is unremarkable.  Her neurologic status is intact.  She declined back imaging today stating that she has an appointment with her orthopedic provider on Tuesday and will speak with them about potential imaging at that time.  She has not taken anything for her symptoms today.  She was given Advil in the ED.  She was ambulatory.  Her x-rays are negative for fractures.  She was encouraged to follow-up as scheduled on Tuesday.  She was sent a prescription for Robaxin for her symptoms.  She is encouraged to alternate Tylenol and ibuprofen  at home for pain.  She was given return precautions.  Stable at discharge.  At this time there does not appear to be any evidence of an acute emergency medical condition and the patient appears stable for discharge with appropriate outpatient follow up. Diagnosis was discussed with patient who verbalizes understanding of care plan and is agreeable to discharge. I have discussed return precautions with patient who verbalizes understanding. Patient encouraged to follow-up with their PCP within 1 week. All questions answered.  Note: Portions of this report may have been transcribed using voice recognition software. Every effort was made to ensure accuracy; however, inadvertent computerized transcription errors may still be present.        Final Clinical Impression(s) / ED Diagnoses Final diagnoses:  Fall, initial encounter  Acute bilateral low back pain without sciatica  Acute pain of left knee  Right elbow pain    Rx / DC Orders ED Discharge Orders          Ordered    methocarbamol (ROBAXIN) 500 MG tablet  2 times daily        03/07/22 1104              Roylene Reason, [place] 03/07/22 1106    Long, [place], MD 03/07/22 1125

## 2022-03-30 ENCOUNTER — Encounter: Payer: Self-pay | Admitting: Nurse Practitioner

## 2022-03-30 ENCOUNTER — Ambulatory Visit: Payer: BC Managed Care – PPO | Admitting: Nurse Practitioner

## 2022-03-30 ENCOUNTER — Other Ambulatory Visit (HOSPITAL_BASED_OUTPATIENT_CLINIC_OR_DEPARTMENT_OTHER): Payer: Self-pay

## 2022-03-30 VITALS — BP 127/79 | HR 67 | Temp 98.0°F | Ht 66.0 in | Wt 234.0 lb

## 2022-03-30 DIAGNOSIS — R0609 Other forms of dyspnea: Secondary | ICD-10-CM

## 2022-03-30 DIAGNOSIS — E559 Vitamin D deficiency, unspecified: Secondary | ICD-10-CM | POA: Diagnosis not present

## 2022-03-30 DIAGNOSIS — Z7985 Long-term (current) use of injectable non-insulin antidiabetic drugs: Secondary | ICD-10-CM

## 2022-03-30 DIAGNOSIS — E669 Obesity, unspecified: Secondary | ICD-10-CM

## 2022-03-30 DIAGNOSIS — Z6837 Body mass index (BMI) 37.0-37.9, adult: Secondary | ICD-10-CM

## 2022-03-30 DIAGNOSIS — E1169 Type 2 diabetes mellitus with other specified complication: Secondary | ICD-10-CM | POA: Diagnosis not present

## 2022-03-30 DIAGNOSIS — F3289 Other specified depressive episodes: Secondary | ICD-10-CM | POA: Diagnosis not present

## 2022-03-30 DIAGNOSIS — E785 Hyperlipidemia, unspecified: Secondary | ICD-10-CM

## 2022-03-30 MED ORDER — SEMAGLUTIDE (1 MG/DOSE) 4 MG/3ML ~~LOC~~ SOPN
1.0000 mg | PEN_INJECTOR | SUBCUTANEOUS | 0 refills | Status: DC
  Filled 2022-03-30: qty 3, 28d supply, fill #0

## 2022-03-30 MED ORDER — VITAMIN D (ERGOCALCIFEROL) 1.25 MG (50000 UNIT) PO CAPS
50000.0000 [IU] | ORAL_CAPSULE | ORAL | 0 refills | Status: DC
  Filled 2022-03-30: qty 4, 28d supply, fill #0

## 2022-03-30 MED ORDER — BUPROPION HCL ER (XL) 150 MG PO TB24
150.0000 mg | ORAL_TABLET | Freq: Every day | ORAL | 0 refills | Status: DC
  Filled 2022-03-30: qty 30, 30d supply, fill #0

## 2022-03-30 NOTE — Progress Notes (Addendum)
Office: [phone]  /  Fax: [phone]  WEIGHT SUMMARY AND BIOMETRICS  Weight Lost Since Last Visit: 0lb  No data recorded  Vitals Temp: 98 F (36.7 C) BP: 127/79 Pulse Rate: 67 SpO2: 100 %   Anthropometric Measurements Height: '5\' 6"'$  (1.676 m) Weight: 234 lb (106.1 kg) BMI (Calculated): 37.79 Weight at Last Visit: 234lb Weight Lost Since Last Visit: 0lb Starting Weight: 240lb   Body Composition  Body Fat %: 46.1 % Fat Mass (lbs): 108.2 lbs Muscle Mass (lbs): 120 lbs Total Body Water (lbs): 84 lbs Visceral Fat Rating : 13   Other Clinical Data RMR: 2131 Today's Visit #: 35 Starting Date: 08/03/19   HPI  Chief Complaint: OBESITY  [name] is here to discuss her progress with her obesity treatment plan. She is on the following a lower carbohydrate, vegetable and lean protein rich diet plan and states she is following her eating plan approximately 85 % of the time. She states she is exercising 30 minutes 3 days per week.   Interval History:  Since last office visit she has maintained her weight.  She is following LCP.  She reports she is weighing and measuring her food.  She is using the lose it app.  She is averaging around 1200-1300 calories, protein 75 gms, fats unsure and carbs unsure.  Struggling with hunger and cravings. Drinking water daily.       Pharmacotherapy for weight loss: She is not currently taking medications    She is taking Wellbutrin XL '150mg'$  for cravings.  Denies side effects.  Does feel that it has helped with cravings but notes cravings while at work.  Feels probably due to stress.    Previous pharmacotherapy for medical weight loss:   None     Bariatric surgery:  Never  Pharmacotherapy for DMT2:  She is currently taking Ozempic 0.'5mg'$ .  Denies side effects.   Last A1c was 6.0 CBGs: She is not checking BS at home.   Episodes of hypoglycemia: no On ACE or ARB, ASA '81mg'$  and statin.  Last eye exam:  12/23 She has tried Metformin in  the past.    Lab Results  Component Value Date   HGBA1C 6.0 08/12/2021   HGBA1C 6.5 12/03/2020   HGBA1C 6.1 06/11/2020   Lab Results  Component Value Date   MICROALBUR <0.7 06/11/2020   LDLCALC 79 08/12/2021   CREATININE 0.73 08/12/2021      Hyperlipidemia Medication(s): Crestor '10mg'$ . Denies side effects.    Lab Results  Component Value Date   CHOL 149 08/12/2021   HDL 42.90 08/12/2021   LDLCALC 79 08/12/2021   TRIG 136.0 08/12/2021   CHOLHDL 3 08/12/2021   Lab Results  Component Value Date   ALT 10 08/12/2021   AST 11 08/12/2021   ALKPHOS 68 08/12/2021   BILITOT 0.5 08/12/2021   The 10-year ASCVD risk score (Arnett DK, et al., 2019) is: 8.8%   Values used to calculate the score:     Age: 43 years     Sex: Female     Is Non-Hispanic African American: Yes     Diabetic: Yes     Tobacco smoker: No     Systolic Blood Pressure: [id number]     Is BP treated: Yes     HDL Cholesterol: 42.9 mg/dL     Total Cholesterol: 149 mg/dL   Eating disorder/emotional eating Rhaegan has had issues with stress/emotional eating.  Struggling with some stress and cravings.  Currently this is moderately  controlled. Overall mood is stable. Medication(s): Wellbutrin XL '150mg'$ .  Denies side effects.    Vit D deficiency  She is taking Vit D 50,000 IU weekly.  Denies side effects.  Denies nausea, vomiting or muscle weakness.    Lab Results  Component Value Date   VD25OH 48.71 08/12/2021   VD25OH 33.92 12/03/2020   VD25OH 37.41 06/11/2020   Dyspnea on exertion: IC obtained today  PHYSICAL EXAM:  Blood pressure 127/79, pulse 67, temperature 98 F (36.7 C), height '5\' 6"'$  (1.676 m), weight 234 lb (106.1 kg), last menstrual period 08/13/2014, SpO2 100 %. Body mass index is 37.77 kg/m.  General: She is overweight, cooperative, alert, well developed, and in no acute distress. PSYCH: Has normal mood, affect and thought process.   Extremities: No edema.  Neurologic: No gross sensory or motor  deficits. No tremors or fasciculations noted.    DIAGNOSTIC DATA REVIEWED:  BMET    Component Value Date/Time   NA 138 08/12/2021 0937   NA 140 08/03/2019 1356   K 4.4 08/12/2021 0937   CL 103 08/12/2021 0937   CO2 29 08/12/2021 0937   GLUCOSE 87 08/12/2021 0937   BUN 13 08/12/2021 0937   BUN 10 08/03/2019 1356   CREATININE 0.73 08/12/2021 0937   CREATININE 0.81 05/01/2013 1105   CALCIUM 9.5 08/12/2021 0937   GFRNONAA 106 08/03/2019 1356   GFRAA 123 08/03/2019 1356   Lab Results  Component Value Date   HGBA1C 6.0 08/12/2021   HGBA1C 5.8 (H) 11/05/2016   Lab Results  Component Value Date   INSULIN 19.2 08/03/2019   INSULIN 12.9 11/05/2016   Lab Results  Component Value Date   TSH 2.47 08/12/2021   CBC    Component Value Date/Time   WBC 7.3 08/12/2021 0937   RBC 4.37 08/12/2021 0937   HGB 13.3 08/12/2021 0937   HGB 13.4 11/05/2016 0948   HCT 40.7 08/12/2021 0937   HCT 40.1 11/05/2016 0948   PLT 412.0 (H) 08/12/2021 0937   MCV 93.2 08/12/2021 0937   MCV 93 11/05/2016 0948   MCH 31.1 11/05/2016 0948   MCH 27.2 08/31/2014 0440   MCHC 32.7 08/12/2021 0937   RDW 15.3 08/12/2021 0937   RDW 14.5 11/05/2016 0948   Iron Studies No results found for: "IRON", "TIBC", "FERRITIN", "IRONPCTSAT" Lipid Panel     Component Value Date/Time   CHOL 149 08/12/2021 0937   CHOL 168 11/05/2016 0948   TRIG 136.0 08/12/2021 0937   HDL 42.90 08/12/2021 0937   HDL 53 11/05/2016 0948   CHOLHDL 3 08/12/2021 0937   VLDL 27.2 08/12/2021 0937   LDLCALC 79 08/12/2021 0937   LDLCALC 101 (H) 11/05/2016 0948   Hepatic Function Panel     Component Value Date/Time   PROT 7.0 08/12/2021 0937   PROT 6.7 08/03/2019 1356   ALBUMIN 4.5 08/12/2021 0937   ALBUMIN 4.1 08/03/2019 1356   AST 11 08/12/2021 0937   ALT 10 08/12/2021 0937   ALKPHOS 68 08/12/2021 0937   BILITOT 0.5 08/12/2021 0937   BILITOT 0.2 08/03/2019 1356      Component Value Date/Time   TSH 2.47 08/12/2021 0937    Nutritional Lab Results  Component Value Date   VD25OH 48.71 08/12/2021   VD25OH 33.92 12/03/2020   VD25OH 37.41 06/11/2020     ASSESSMENT AND PLAN  TREATMENT PLAN FOR OBESITY:  Recommended Dietary Goals  Jazmon is currently in the action stage of change. As such, her goal is to continue weight  management plan. She has agreed to keeping a food journal and adhering to recommended goals of 1200 calories and 80+ protein.  Behavioral Intervention  We discussed the following Behavioral Modification Strategies today: increasing lean protein intake, increasing vegetables, increasing water intake, and work on meal planning and easy cooking plans.  Additional resources provided today: NA  Recommended Physical Activity Goals  Rhyley has been advised to work up to 150 minutes of moderate intensity aerobic activity a week and strengthening exercises 2-3 times per week for cardiovascular health, weight loss maintenance and preservation of muscle mass.   She has agreed to increase physical activity in their day and reduce sedentary time (increase NEAT). , Patient also encouraged on scheduling and tracking physical activity. , and continue physical activity as is.    Pharmacotherapy We discussed various medication options to help Caren with her weight loss efforts and we both agreed to increase Ozempic '1mg'$ .  Side effects discussed.  ASSOCIATED CONDITIONS ADDRESSED TODAY  Action/Plan  Type 2 diabetes mellitus with other specified complication, without long-term current use of insulin (HCC) -     Semaglutide (1 MG/DOSE); Inject 1 mg as directed once a week.  Dispense: 3 mL; Refill: 0 -     Comprehensive metabolic panel -     Hemoglobin A1c -     Insulin, random  Good blood sugar control is important to decrease the likelihood of diabetic complications such as nephropathy, neuropathy, limb loss, blindness, coronary artery disease, and death. Intensive lifestyle modification including  diet, exercise and weight loss are the first line of treatment for diabetes.    Hyperlipidemia associated with type 2 diabetes mellitus ([place]) -     Comprehensive metabolic panel -     Lipid Panel With LDL/HDL Ratio  Cardiovascular risk and specific lipid/LDL goals reviewed.  We discussed several lifestyle modifications today and Breighlynn will continue to work on diet, exercise and weight loss efforts. Orders and follow up as documented in patient record.   Counseling Intensive lifestyle modifications are the first line treatment for this issue. Dietary changes: Increase soluble fiber. Decrease simple carbohydrates. Exercise changes: Moderate to vigorous-intensity aerobic activity 150 minutes per week if tolerated. Lipid-lowering medications: see documented in medical record.   Vitamin D deficiency -     VITAMIN D 25 Hydroxy (Vit-D Deficiency, Fractures) -     Vitamin D (Ergocalciferol); Take 1 capsule (50,000 Units total) by mouth every 7 (seven) days.  Dispense: 4 capsule; Refill: 0  Dyspnea on exertion IC obtained today.  Doesn't feel that she did well with last IC.  Wasn't able to do it due to claustrophobia.  Repeated today due to difference between last 2 ICs.    Last IC 1627 on 08/14/21 Today IC 2131, improved.  I suspect last IC was off due to patient struggling with test  Continue working on dietary changes, exercise and increase water intake.    Other Depression with emotional eating -     buPROPion HCl ER (XL); Take 1 tablet (150 mg total) by mouth daily.  Dispense: 30 tablet; Refill: 0  Generalized obesity  BMI 37.0-37.9, adult      Return in about 4 weeks (around 04/27/2022).Marland Kitchen She was informed of the importance of frequent follow up visits to maximize her success with intensive lifestyle modifications for her multiple health conditions.   ATTESTASTION STATEMENTS:  Reviewed by clinician on day of visit: allergies, medications, problem list, medical history, surgical  history, family history, social history, and previous encounter notes.  Ailene Rud. Bao Coreas FNP-C

## 2022-03-30 NOTE — Patient Instructions (Signed)

## 2022-04-01 LAB — LIPID PANEL WITH LDL/HDL RATIO
Cholesterol, Total: 192 mg/dL (ref 100–199)
HDL: 42 mg/dL
LDL Chol Calc (NIH): 124 mg/dL — ABNORMAL HIGH (ref 0–99)
LDL/HDL Ratio: 3 ratio (ref 0.0–3.2)
Triglycerides: 143 mg/dL (ref 0–149)
VLDL Cholesterol Cal: 26 mg/dL (ref 5–40)

## 2022-04-01 LAB — VITAMIN D 25 HYDROXY (VIT D DEFICIENCY, FRACTURES): Vit D, 25-Hydroxy: 60.7 ng/mL (ref 30.0–100.0)

## 2022-04-01 LAB — COMPREHENSIVE METABOLIC PANEL
ALT: 13 IU/L (ref 0–32)
AST: 17 IU/L (ref 0–40)
Albumin/Globulin Ratio: 1.9 (ref 1.2–2.2)
Albumin: 4.6 g/dL (ref 3.8–4.9)
Alkaline Phosphatase: 67 IU/L (ref 44–121)
BUN/Creatinine Ratio: 16 (ref 9–23)
BUN: 12 mg/dL (ref 6–24)
Bilirubin Total: 0.3 mg/dL (ref 0.0–1.2)
CO2: 22 mmol/L (ref 20–29)
Calcium: 9.6 mg/dL (ref 8.7–10.2)
Chloride: 104 mmol/L (ref 96–106)
Creatinine, Ser: 0.76 mg/dL (ref 0.57–1.00)
Globulin, Total: 2.4 g/dL (ref 1.5–4.5)
Glucose: 87 mg/dL (ref 70–99)
Potassium: 4.4 mmol/L (ref 3.5–5.2)
Sodium: 142 mmol/L (ref 134–144)
Total Protein: 7 g/dL (ref 6.0–8.5)
eGFR: 95 mL/min/{1.73_m2} (ref >59)

## 2022-04-01 LAB — INSULIN, RANDOM: INSULIN: 19.9 u[IU]/mL (ref 2.6–24.9)

## 2022-04-01 LAB — HEMOGLOBIN A1C
Est. average glucose Bld gHb Est-mCnc: 126 mg/dL
Hgb A1c MFr Bld: 6 % — ABNORMAL HIGH (ref 4.8–5.6)

## 2022-04-07 ENCOUNTER — Other Ambulatory Visit (HOSPITAL_BASED_OUTPATIENT_CLINIC_OR_DEPARTMENT_OTHER): Payer: Self-pay

## 2022-04-27 ENCOUNTER — Ambulatory Visit: Payer: BC Managed Care – PPO | Admitting: Nurse Practitioner

## 2022-05-11 ENCOUNTER — Encounter: Payer: Self-pay | Admitting: *Deleted

## 2022-06-08 ENCOUNTER — Encounter: Payer: Self-pay | Admitting: Nurse Practitioner

## 2022-06-08 ENCOUNTER — Other Ambulatory Visit (HOSPITAL_BASED_OUTPATIENT_CLINIC_OR_DEPARTMENT_OTHER): Payer: Self-pay

## 2022-06-08 ENCOUNTER — Ambulatory Visit: Payer: BC Managed Care – PPO | Admitting: Nurse Practitioner

## 2022-06-08 VITALS — BP 142/82 | HR 70 | Temp 98.2°F | Ht 66.0 in | Wt 228.0 lb

## 2022-06-08 DIAGNOSIS — F3289 Other specified depressive episodes: Secondary | ICD-10-CM | POA: Diagnosis not present

## 2022-06-08 DIAGNOSIS — I10 Essential (primary) hypertension: Secondary | ICD-10-CM | POA: Diagnosis not present

## 2022-06-08 DIAGNOSIS — E669 Obesity, unspecified: Secondary | ICD-10-CM

## 2022-06-08 DIAGNOSIS — Z6836 Body mass index (BMI) 36.0-36.9, adult: Secondary | ICD-10-CM

## 2022-06-08 DIAGNOSIS — Z7985 Long-term (current) use of injectable non-insulin antidiabetic drugs: Secondary | ICD-10-CM

## 2022-06-08 DIAGNOSIS — E559 Vitamin D deficiency, unspecified: Secondary | ICD-10-CM | POA: Diagnosis not present

## 2022-06-08 DIAGNOSIS — E1169 Type 2 diabetes mellitus with other specified complication: Secondary | ICD-10-CM

## 2022-06-08 MED ORDER — BUPROPION HCL ER (XL) 150 MG PO TB24
150.0000 mg | ORAL_TABLET | Freq: Every day | ORAL | 0 refills | Status: DC
  Filled 2022-06-08: qty 30, 30d supply, fill #0

## 2022-06-08 MED ORDER — VITAMIN D (ERGOCALCIFEROL) 1.25 MG (50000 UNIT) PO CAPS
50000.0000 [IU] | ORAL_CAPSULE | ORAL | 0 refills | Status: DC
  Filled 2022-06-08: qty 4, 56d supply, fill #0

## 2022-06-08 MED ORDER — SEMAGLUTIDE (1 MG/DOSE) 4 MG/3ML ~~LOC~~ SOPN
1.0000 mg | PEN_INJECTOR | SUBCUTANEOUS | 0 refills | Status: DC
  Filled 2022-06-08: qty 3, 28d supply, fill #0

## 2022-06-08 NOTE — Progress Notes (Signed)
Office: [phone]  /  Fax: [phone]  WEIGHT SUMMARY AND BIOMETRICS  Weight Lost Since Last Visit: 6lb  No data recorded  Vitals Temp: 98.2 F (36.8 C) BP: (!) 142/82 Pulse Rate: 70 SpO2: 97 %   Anthropometric Measurements Height: 5\' 6"  (1.676 m) Weight: 228 lb (103.4 kg) BMI (Calculated): 36.82 Weight at Last Visit: 234lb Weight Lost Since Last Visit: 6lb Starting Weight: 240lb Total Weight Loss (lbs): 12 lb (5.443 kg)   Body Composition  Body Fat %: 44.8 % Fat Mass (lbs): 102.2 lbs Muscle Mass (lbs): 119.6 lbs Total Body Water (lbs): 83.6 lbs Visceral Fat Rating : 12   Other Clinical Data Fasting: Yes Today's Visit #: 30 Starting Date: 08/03/19     HPI  Chief Complaint: OBESITY  [name] is here to discuss her progress with her obesity treatment plan. She is on the following a lower carbohydrate, vegetable and lean protein rich diet plan and states she is following her eating plan approximately 80 % of the time. She states she is exercising 2 minutes 30 days per week.   Interval History:  Since last office visit she has lost 6 pounds.  She is follow LCP and aiming to get in 110-120 grams of protein. Her hunger and cravings are better. She was going to a Systems analyst that was pushing carbs.  Since her last office visit, she stopped going to this personal trainer and has increased her protein intake and has decreased her carbs intake. She is drinking water and sugar free tea.     Pharmacotherapy for weight loss: She is not currently taking medications  for medical weight loss.     Previous pharmacotherapy for medical weight loss:  none  Bariatric surgery:  Patient has not had bariatric surgery.    Pharmacotherapy for DMT2:  She is currently taking Ozempic 1mg .  Denies side effects.   Last A1c was 6.0 She is not checking BS at home.   Episodes of hypoglycemia: no On a statin Last eye exam:  12/23 She has tried Metformin in the past.    Lab  Results  Component Value Date   HGBA1C 6.0 (H) 03/30/2022   HGBA1C 6.0 08/12/2021   HGBA1C 6.5 12/03/2020   Lab Results  Component Value Date   MICROALBUR <0.7 06/11/2020   LDLCALC 124 (H) 03/30/2022   CREATININE 0.76 03/30/2022    Hypertension Hypertension BP is elevated today.  Reports she didn't take her meds yesterday.  Medication(s): Norvasc 10mg , Lasix 20mg , Bystolic 10mg  Denies chest pain, palpitations and SOB.  BP Readings from Last 3 Encounters:  06/08/22 (!) 142/82  03/30/22 127/79  03/07/22 (!) 145/97   Lab Results  Component Value Date   CREATININE 0.76 03/30/2022   CREATININE 0.73 08/12/2021   CREATININE 0.77 12/03/2020    Vit D deficiency  She is taking Vit D 50,000 IU weekly.  Denies side effects.  Denies nausea, vomiting or muscle weakness.    Lab Results  Component Value Date   VD25OH 60.7 03/30/2022   VD25OH 48.71 08/12/2021   VD25OH 33.92 12/03/2020    Eating disorder/emotional eating [name] has had issues with stress/emotional eating.  Struggling with some stress and cravings but has gotten better with Ozempic and Wellbutrin.  Currently this is moderately controlled. Overall mood is stable. Medication(s): Wellbutrin XL 150mg .  Denies side effects.    PHYSICAL EXAM:  Blood pressure (!) 142/82, pulse 70, temperature 98.2 F (36.8 C), height 5\' 6"  (1.676 m), weight 228 lb (103.4  kg), last menstrual period 08/13/2014, SpO2 97 %. Body mass index is 36.8 kg/m.  General: She is overweight, cooperative, alert, well developed, and in no acute distress. PSYCH: Has normal mood, affect and thought process.   Extremities: No edema.  Neurologic: No gross sensory or motor deficits. No tremors or fasciculations noted.    DIAGNOSTIC DATA REVIEWED:  BMET    Component Value Date/Time   NA 142 03/30/2022 0750   K 4.4 03/30/2022 0750   CL 104 03/30/2022 0750   CO2 22 03/30/2022 0750   GLUCOSE 87 03/30/2022 0750   GLUCOSE 87 08/12/2021 0937   BUN 12  03/30/2022 0750   CREATININE 0.76 03/30/2022 0750   CREATININE 0.81 05/01/2013 1105   CALCIUM 9.6 03/30/2022 0750   GFRNONAA 106 08/03/2019 1356   GFRAA 123 08/03/2019 1356   Lab Results  Component Value Date   HGBA1C 6.0 (H) 03/30/2022   HGBA1C 5.8 (H) 11/05/2016   Lab Results  Component Value Date   INSULIN 19.9 03/30/2022   INSULIN 12.9 11/05/2016   Lab Results  Component Value Date   TSH 2.47 08/12/2021   CBC    Component Value Date/Time   WBC 7.3 08/12/2021 0937   RBC 4.37 08/12/2021 0937   HGB 13.3 08/12/2021 0937   HGB 13.4 11/05/2016 0948   HCT 40.7 08/12/2021 0937   HCT 40.1 11/05/2016 0948   PLT 412.0 (H) 08/12/2021 0937   MCV 93.2 08/12/2021 0937   MCV 93 11/05/2016 0948   MCH 31.1 11/05/2016 0948   MCH 27.2 08/31/2014 0440   MCHC 32.7 08/12/2021 0937   RDW 15.3 08/12/2021 0937   RDW 14.5 11/05/2016 0948   Iron Studies No results found for: "IRON", "TIBC", "FERRITIN", "IRONPCTSAT" Lipid Panel     Component Value Date/Time   CHOL 192 03/30/2022 0750   TRIG 143 03/30/2022 0750   HDL 42 03/30/2022 0750   CHOLHDL 3 08/12/2021 0937   VLDL 27.2 08/12/2021 0937   LDLCALC 124 (H) 03/30/2022 0750   Hepatic Function Panel     Component Value Date/Time   PROT 7.0 03/30/2022 0750   ALBUMIN 4.6 03/30/2022 0750   AST 17 03/30/2022 0750   ALT 13 03/30/2022 0750   ALKPHOS 67 03/30/2022 0750   BILITOT 0.3 03/30/2022 0750      Component Value Date/Time   TSH 2.47 08/12/2021 0937   Nutritional Lab Results  Component Value Date   VD25OH 60.7 03/30/2022   VD25OH 48.71 08/12/2021   VD25OH 33.92 12/03/2020     ASSESSMENT AND PLAN  TREATMENT PLAN FOR OBESITY:  Recommended Dietary Goals  [name] is currently in the action stage of change. As such, her goal is to continue weight management plan. She has agreed to keeping a food journal and adhering to recommended goals of 1500-1600 calories and 100+ protein.  Behavioral Intervention  We discussed  the following Behavioral Modification Strategies today: increasing lean protein intake, decreasing simple carbohydrates , increasing vegetables, increasing lower glycemic fruits, increasing fiber rich foods, avoiding skipping meals, increasing water intake, continue to practice mindfulness when eating, and planning for success.  Additional resources provided today: NA  Recommended Physical Activity Goals  [name] has been advised to work up to 150 minutes of moderate intensity aerobic activity a week and strengthening exercises 2-3 times per week for cardiovascular health, weight loss maintenance and preservation of muscle mass.   She has agreed to Think about ways to increase daily physical activity and overcoming barriers to exercise, Increase physical  activity in their day and reduce sedentary time (increase NEAT)., and Work on scheduling and tracking physical activity.   ASSOCIATED CONDITIONS ADDRESSED TODAY  Action/Plan  Type 2 diabetes mellitus with other specified complication, without long-term current use of insulin (HCC) -     continue Semaglutide (1 MG/DOSE); Inject 1 mg as directed once a week.  Dispense: 3 mL; Refill: 0  Good blood sugar control is important to decrease the likelihood of diabetic complications such as nephropathy, neuropathy, limb loss, blindness, coronary artery disease, and death. Intensive lifestyle modification including diet, exercise and weight loss are the first line of treatment for diabetes.    Essential hypertension Continue to follow up with PCP.  Take meds as directed.    Vitamin D deficiency -     Vitamin D (Ergocalciferol); Take 1 capsule (50,000 Units total) by mouth every 14 (fourteen) days.  Dispense: 4 capsule; Refill: 0  Labs reviewed in chart with patient.    Low Vitamin D level contributes to fatigue and are associated with obesity, breast, and colon cancer. She agrees to continue to take prescription Vitamin D @50 ,000 IU every week and will  follow-up for routine testing of Vitamin D, at least 2-3 times per year to avoid over-replacement.   Other Depression with emotional eating -   continue  buPROPion HCl ER (XL); Take 1 tablet (150 mg total) by mouth daily.  Dispense: 30 tablet; Refill: 0  Generalized obesity  BMI 36.0-36.9,adult         Return in about 4 weeks (around 07/06/2022).Marland Kitchen She was informed of the importance of frequent follow up visits to maximize her success with intensive lifestyle modifications for her multiple health conditions.   ATTESTASTION STATEMENTS:  Reviewed by clinician on day of visit: allergies, medications, problem list, medical history, surgical history, family history, social history, and previous encounter notes.     Theodis Sato. Hermina Barnard FNP-C

## 2022-07-06 LAB — HM MAMMOGRAPHY

## 2022-07-07 ENCOUNTER — Other Ambulatory Visit (HOSPITAL_BASED_OUTPATIENT_CLINIC_OR_DEPARTMENT_OTHER): Payer: Self-pay

## 2022-07-07 ENCOUNTER — Encounter: Payer: Self-pay | Admitting: Family Medicine

## 2022-07-07 ENCOUNTER — Other Ambulatory Visit: Payer: Self-pay | Admitting: Family

## 2022-07-07 ENCOUNTER — Other Ambulatory Visit: Payer: Self-pay

## 2022-07-07 ENCOUNTER — Other Ambulatory Visit: Payer: Self-pay | Admitting: Nurse Practitioner

## 2022-07-07 DIAGNOSIS — E559 Vitamin D deficiency, unspecified: Secondary | ICD-10-CM

## 2022-07-07 DIAGNOSIS — F411 Generalized anxiety disorder: Secondary | ICD-10-CM

## 2022-07-07 MED ORDER — VITAMIN D (ERGOCALCIFEROL) 1.25 MG (50000 UNIT) PO CAPS
50000.0000 [IU] | ORAL_CAPSULE | ORAL | 0 refills | Status: DC
  Filled 2022-07-07: qty 4, 56d supply, fill #0

## 2022-07-13 ENCOUNTER — Other Ambulatory Visit (HOSPITAL_BASED_OUTPATIENT_CLINIC_OR_DEPARTMENT_OTHER): Payer: Self-pay

## 2022-07-13 ENCOUNTER — Ambulatory Visit: Payer: BC Managed Care – PPO | Admitting: Family Medicine

## 2022-07-13 ENCOUNTER — Encounter: Payer: Self-pay | Admitting: Family Medicine

## 2022-07-13 VITALS — BP 120/88 | HR 83 | Temp 98.5°F | Resp 18 | Ht 66.0 in | Wt 235.0 lb

## 2022-07-13 DIAGNOSIS — I10 Essential (primary) hypertension: Secondary | ICD-10-CM | POA: Diagnosis not present

## 2022-07-13 DIAGNOSIS — F411 Generalized anxiety disorder: Secondary | ICD-10-CM

## 2022-07-13 DIAGNOSIS — F418 Other specified anxiety disorders: Secondary | ICD-10-CM | POA: Diagnosis not present

## 2022-07-13 DIAGNOSIS — T753XXA Motion sickness, initial encounter: Secondary | ICD-10-CM | POA: Diagnosis not present

## 2022-07-13 DIAGNOSIS — D500 Iron deficiency anemia secondary to blood loss (chronic): Secondary | ICD-10-CM

## 2022-07-13 MED ORDER — SCOPOLAMINE 1 MG/3DAYS TD PT72
1.0000 | MEDICATED_PATCH | TRANSDERMAL | 12 refills | Status: DC
  Filled 2022-07-13: qty 10, 30d supply, fill #0

## 2022-07-13 MED ORDER — ESCITALOPRAM OXALATE 20 MG PO TABS
20.0000 mg | ORAL_TABLET | Freq: Every day | ORAL | 3 refills | Status: DC
  Filled 2022-07-13: qty 90, 90d supply, fill #0
  Filled 2023-03-11: qty 90, 90d supply, fill #1

## 2022-07-13 MED ORDER — LORAZEPAM 1 MG PO TABS
1.0000 mg | ORAL_TABLET | Freq: Every day | ORAL | 1 refills | Status: DC
  Filled 2022-07-13: qty 30, 30d supply, fill #0

## 2022-07-13 NOTE — Assessment & Plan Note (Signed)
Well controlled, no changes to meds. Encouraged heart healthy diet such as the DASH diet and exercise as tolerated.  °

## 2022-07-13 NOTE — Assessment & Plan Note (Signed)
Recheck labs 

## 2022-07-13 NOTE — Assessment & Plan Note (Signed)
Stable  Con't lexapro and lorazepam

## 2022-07-13 NOTE — Progress Notes (Signed)
Established Patient Office Visit  Subjective   Patient ID: [name], female    DOB: 04-02-1970  Age: 52 y.o. MRN: [id number]  Chief Complaint  Patient presents with   Hypertension   Hyperlipidemia   Follow-up    HPI Discussed the use of AI scribe software for clinical note transcription with the patient, who gave verbal consent to proceed.  History of Present Illness   The patient, with a history of high cholesterol and prediabetes, presents for a follow-up visit. They report inconsistent weight loss efforts, fluctuating between 229 and 235 pounds. 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 They are currently on Crestor 10mg  for cholesterol management, but admit to inconsistent medication adherence as they were attempting to lower their cholesterol naturally.  The patient also reports recent issues with bloating and constipation, particularly when traveling. They have tried over-the-counter remedies such as Miralax and Dulcolax with limited success. They express interest in Linzess, a medication for irritable bowel syndrome and chronic constipation, but the doctor advises trying other methods first, such as increasing water and fiber intake.  In addition, the patient is planning an eight-day cruise and requests a refill of motion sickness patches, which they have used successfully in the past.      Patient Active Problem List   Diagnosis Date Noted   Generalized obesity 03/02/2022   Migraine without status migrainosus, not intractable 11/11/2021   Morbid obesity (HCC) 08/12/2021   Keloid 08/12/2021   B12 deficiency 07/24/2021   Other fatigue 07/24/2021   Diabetes mellitus (HCC) 03/18/2021   Pre-operative clearance 07/12/2020   Depression with anxiety 07/12/2020   Primary osteoarthritis involving multiple joints 07/12/2020   Preventative health care 06/11/2020    Degenerative arthritis of knee, bilateral 07/27/2017   Prediabetes 12/02/2016   Class 2 severe obesity with serious comorbidity and body mass index (BMI) of 37.0 to 37.9 in adult [facility]) 12/02/2016   Depression 08/16/2016   Generalized anxiety disorder 08/16/2016   Hypokalemia 08/15/2015   S/P hysterectomy 08/30/2014   Pelvic pain in female 05/01/2013   Insomnia 12/07/2012   Situational anxiety 10/17/2012   Umbilical hernia 08/23/2012   Vitamin D deficiency 08/12/2012   History of cyst of breast 08/12/2012   Anemia 07/28/2012   Anxiety 07/28/2012   Essential hypertension 07/28/2012   Menorrhagia 07/28/2012   S/P endometrial ablation 07/28/2012   Family history of breast cancer in first degree relative 07/28/2012   Past Medical History:  Diagnosis Date   Anemia    Anxiety    Arthritis    Depression    Diabetes mellitus without complication (HCC)    Fatigue    Fibroids    uterine   Hyperlipidemia    but meds   Hypertension    Insulin resistance    Joint pain    Lower extremity edema    Menorrhagia    Pre-diabetes    Umbilical hernia    Vitamin D deficiency    Past Surgical History:  Procedure Laterality Date   CESAREAN SECTION     CRYOTHERAPY  01/27/1987   HYSTEROSCOPY WITH D & C  06/20/2008   NOVASURE ABLATION  06/20/2008   REPLACEMENT TOTAL KNEE BILATERAL     ROBOTIC ASSISTED TOTAL HYSTERECTOMY N/A 08/30/2014   Procedure: ROBOTIC ASSISTED ATTEMPTED, THEN CONVERSION TO OPEN TOTAL ABDOMINAL HYSTERECTOMY WITH BILATERAL SALPINGECTOMY;  Surgeon: Maxie Better, MD;  Location: [facility];  Service: Gynecology;  Laterality: N/A;   TMJ ARTHROPLASTY  01/27/1996   has screws in both side of jaw per pt   tubal ligation  06/20/2008   UMBILICAL HERNIA REPAIR N/A 08/30/2014   Procedure: HERNIA REPAIR UMBILICAL ADULT;  Surgeon: Chevis Pretty III, MD;  Location: [facility];  Service: General;  Laterality: N/A;   Social History   Tobacco Use   Smoking status: Never    Passive  exposure: Never   Smokeless tobacco: Never  Vaping Use   Vaping Use: Never used  Substance Use Topics   Alcohol use: No   Drug use: No   Social History   Socioeconomic History   Marital status: Divorced    Spouse name: Not on file   Number of children: 2   Years of education: Not on file   Highest education level: Not on file  Occupational History   Occupation: Magazine features editor: [employer]  Tobacco Use   Smoking status: Never    Passive exposure: Never   Smokeless tobacco: Never  Vaping Use   Vaping Use: Never used  Substance and Sexual Activity   Alcohol use: No   Drug use: No   Sexual activity: Yes    Birth control/protection: Surgical  Other Topics Concern   Not on file  Social History Narrative   Not on file   Social Determinants of Health   Financial Resource Strain: Not on file  Food Insecurity: Not on file  Transportation Needs: Not on file  Physical Activity: Not on file  Stress: Not on file  Social Connections: Not on file  Intimate Partner Violence: Not on file   Family Status  Relation Name Status   Mother  Alive   Father  Alive   Mat Aunt  (Not Specified)   Neg Hx  (Not Specified)   Family History  Problem Relation Age of Onset   Colon polyps Mother    Breast cancer Mother    Hypertension Mother    Diabetes Mother    Cancer Mother        breast   Hyperlipidemia Mother    Obesity Mother    Thyroid disease Mother    Hypertension Father    Hyperlipidemia Father    Anxiety disorder Father    Cancer Father    Breast cancer Maternal Aunt    Cancer Maternal Aunt        beast   Colon cancer Neg Hx    Esophageal cancer Neg Hx    Rectal cancer Neg Hx    Stomach cancer Neg Hx    Allergies  Allergen Reactions   Hydrocodone Other (See Comments)    hallucinations   Lisinopril Swelling and Other (See Comments)    Cough       Review of Systems  Constitutional:  Negative for fever and malaise/fatigue.  HENT:   Negative for congestion.   Eyes:  Negative for blurred vision.  Respiratory:  Negative for shortness of breath.   Cardiovascular:  Negative for chest pain, palpitations and leg swelling.  Gastrointestinal:  Negative for abdominal pain, blood in stool and nausea.  Genitourinary:  Negative for dysuria and frequency.  Musculoskeletal:  Negative for falls.  Skin:  Negative for rash.  Neurological:  Negative for dizziness, loss of consciousness and headaches.  Endo/Heme/Allergies:  Negative for environmental allergies.  Psychiatric/Behavioral:  Negative for depression. The patient is not nervous/anxious.       Objective:  BP 120/88 (BP Location: Left Arm, Patient Position: Sitting, Cuff Size: Large)   Pulse 83   Temp 98.5 F (36.9 C) (Oral)   Resp 18   Ht 5\' 6"  (1.676 m)   Wt 235 lb (106.6 kg)   LMP 08/13/2014   SpO2 98%   BMI 37.93 kg/m  BP Readings from Last 3 Encounters:  07/13/22 120/88  06/08/22 (!) 142/82  03/30/22 127/79   Wt Readings from Last 3 Encounters:  07/13/22 235 lb (106.6 kg)  06/08/22 228 lb (103.4 kg)  03/30/22 234 lb (106.1 kg)   SpO2 Readings from Last 3 Encounters:  07/13/22 98%  06/08/22 97%  03/30/22 100%      Physical Exam Vitals and nursing note reviewed.  Constitutional:      Appearance: She is well-developed.  HENT:     Head: Normocephalic and atraumatic.  Eyes:     Conjunctiva/sclera: Conjunctivae normal.  Neck:     Thyroid: No thyromegaly.     Vascular: No carotid bruit or JVD.  Cardiovascular:     Rate and Rhythm: Normal rate and regular rhythm.     Heart sounds: Normal heart sounds. No murmur heard. Pulmonary:     Effort: Pulmonary effort is normal. No respiratory distress.     Breath sounds: Normal breath sounds. No wheezing or rales.  Chest:     Chest wall: No tenderness.  Musculoskeletal:     Cervical back: Normal range of motion and neck supple.  Neurological:     General: No focal deficit present.     Mental  Status: She is alert and oriented to person, place, and time. Mental status is at baseline.  Psychiatric:        Mood and Affect: Mood normal.        Behavior: Behavior normal.        Thought Content: Thought content normal.        Judgment: Judgment normal.      No results found for any visits on 07/13/22.  Last CBC Lab Results  Component Value Date   WBC 7.3 08/12/2021   HGB 13.3 08/12/2021   HCT 40.7 08/12/2021   MCV 93.2 08/12/2021   MCH 31.1 11/05/2016   RDW 15.3 08/12/2021   PLT 412.0 (H) 08/12/2021   Last metabolic panel Lab Results  Component Value Date   GLUCOSE 87 03/30/2022   NA 142 03/30/2022   K 4.4 03/30/2022   CL 104 03/30/2022   CO2 22 03/30/2022   BUN 12 03/30/2022   CREATININE 0.76 03/30/2022   EGFR 95 03/30/2022   CALCIUM 9.6 03/30/2022   PROT 7.0 03/30/2022   ALBUMIN 4.6 03/30/2022   LABGLOB 2.4 03/30/2022   AGRATIO 1.9 03/30/2022   BILITOT 0.3 03/30/2022   ALKPHOS 67 03/30/2022   AST 17 03/30/2022   ALT 13 03/30/2022   ANIONGAP 7 08/31/2014   Last lipids Lab Results  Component Value Date   CHOL 192 03/30/2022   HDL 42 03/30/2022   LDLCALC 124 (H) 03/30/2022   TRIG 143 03/30/2022   CHOLHDL 3 08/12/2021   Last hemoglobin A1c Lab Results  Component Value Date   HGBA1C 6.0 (H) 03/30/2022   Last thyroid functions Lab Results  Component Value Date   TSH 2.47 08/12/2021   T3TOTAL 111 11/05/2016   Last vitamin D Lab Results  Component Value Date   VD25OH 60.7 03/30/2022   Last vitamin B12 and Folate Lab Results  Component Value Date   VITAMINB12 266 08/03/2019  FOLATE 6.7 08/03/2019      The 10-year ASCVD risk score (Arnett DK, et al., 2019) is: 9.2%    Assessment & Plan:   Problem List Items Addressed This Visit       Unprioritized   Generalized anxiety disorder   Relevant Medications   LORazepam (ATIVAN) 1 MG tablet   escitalopram (LEXAPRO) 20 MG tablet   Essential hypertension    Well controlled, no changes  to meds. Encouraged heart healthy diet such as the DASH diet and exercise as tolerated.        Depression with anxiety    Stable  Con't lexapro and lorazepam       Relevant Medications   LORazepam (ATIVAN) 1 MG tablet   escitalopram (LEXAPRO) 20 MG tablet   Anemia    Recheck labs       Other Visit Diagnoses     Motion sickness, initial encounter    -  Primary   Relevant Medications   scopolamine (TRANSDERM-SCOP) 1 MG/3DAYS     Assessment and Plan    Hyperlipidemia: LDL slightly elevated at 124 (goal <100). Patient has been inconsistently taking Crestor 10mg . -Encouraged consistent use of Crestor 10mg  nightly. -Plan to recheck lipid panel at next [facility] visit.  Prediabetes: A1C at 6. Patient is currently participating in a weight loss program and acknowledges room for dietary improvements. -Encouraged continued participation in weight loss program and dietary improvements.  Chronic Constipation: Patient reports recent issues with constipation and bloating, particularly when traveling. -Recommended increased water intake, dietary fiber, and use of over-the-counter remedies such as Miralax as needed. -If these measures are ineffective, consider prescription medication such as Linzess.  Motion Sickness: Patient has a history of motion sickness and is planning an upcoming cruise. -Plan to provide prescription for motion sickness patches for use during cruise.        No follow-ups on file.    Donato Schultz, DO

## 2022-07-14 ENCOUNTER — Other Ambulatory Visit (HOSPITAL_BASED_OUTPATIENT_CLINIC_OR_DEPARTMENT_OTHER): Payer: Self-pay

## 2022-07-14 ENCOUNTER — Ambulatory Visit: Payer: BC Managed Care – PPO | Admitting: Nurse Practitioner

## 2022-07-14 ENCOUNTER — Encounter: Payer: Self-pay | Admitting: Nurse Practitioner

## 2022-07-14 VITALS — BP 132/81 | HR 84 | Temp 98.5°F | Ht 66.0 in | Wt 231.0 lb

## 2022-07-14 DIAGNOSIS — K5903 Drug induced constipation: Secondary | ICD-10-CM | POA: Diagnosis not present

## 2022-07-14 DIAGNOSIS — R4689 Other symptoms and signs involving appearance and behavior: Secondary | ICD-10-CM

## 2022-07-14 DIAGNOSIS — E1169 Type 2 diabetes mellitus with other specified complication: Secondary | ICD-10-CM | POA: Diagnosis not present

## 2022-07-14 DIAGNOSIS — E669 Obesity, unspecified: Secondary | ICD-10-CM

## 2022-07-14 DIAGNOSIS — Z6837 Body mass index (BMI) 37.0-37.9, adult: Secondary | ICD-10-CM

## 2022-07-14 DIAGNOSIS — E559 Vitamin D deficiency, unspecified: Secondary | ICD-10-CM | POA: Diagnosis not present

## 2022-07-14 DIAGNOSIS — F3289 Other specified depressive episodes: Secondary | ICD-10-CM

## 2022-07-14 DIAGNOSIS — Z7985 Long-term (current) use of injectable non-insulin antidiabetic drugs: Secondary | ICD-10-CM

## 2022-07-14 MED ORDER — BUPROPION HCL ER (XL) 150 MG PO TB24
150.0000 mg | ORAL_TABLET | Freq: Every day | ORAL | 0 refills | Status: DC
  Filled 2022-07-14: qty 30, 30d supply, fill #0

## 2022-07-14 MED ORDER — VITAMIN D (ERGOCALCIFEROL) 1.25 MG (50000 UNIT) PO CAPS
50000.0000 [IU] | ORAL_CAPSULE | ORAL | 0 refills | Status: DC
  Filled 2022-07-14: qty 4, 56d supply, fill #0

## 2022-07-14 MED ORDER — SEMAGLUTIDE (1 MG/DOSE) 4 MG/3ML ~~LOC~~ SOPN
1.0000 mg | PEN_INJECTOR | SUBCUTANEOUS | 0 refills | Status: DC
  Filled 2022-07-14: qty 3, 28d supply, fill #0

## 2022-07-14 MED ORDER — LINACLOTIDE 72 MCG PO CAPS
72.0000 ug | ORAL_CAPSULE | Freq: Every day | ORAL | 0 refills | Status: DC
  Filled 2022-07-14: qty 30, 30d supply, fill #0

## 2022-07-14 NOTE — Patient Instructions (Signed)

## 2022-07-14 NOTE — Progress Notes (Signed)
Office: [phone]  /  Fax: [phone]  WEIGHT SUMMARY AND BIOMETRICS  No data recorded Weight Gained Since Last Visit: 3lb   Vitals Temp: 98.5 F (36.9 C) BP: 132/81 Pulse Rate: 84 SpO2: 99 %   Anthropometric Measurements Height: 5\' 6"  (1.676 m) Weight: 231 lb (104.8 kg) BMI (Calculated): 37.3 Weight at Last Visit: 228lb Weight Gained Since Last Visit: 3lb Starting Weight: 240lb Total Weight Loss (lbs): 9 lb (4.082 kg)   Body Composition  Body Fat %: 45.7 % Fat Mass (lbs): 105.6 lbs Muscle Mass (lbs): 119.2 lbs Total Body Water (lbs): 86.8 lbs Visceral Fat Rating : 13   Other Clinical Data Fasting: Yes Labs: No Today's Visit #: 31 Starting Date: 08/03/19     HPI  Chief Complaint: OBESITY  [name] is here to discuss her progress with her obesity treatment plan. She is on the following a lower carbohydrate, vegetable and lean protein rich diet plan and states she is following her eating plan approximately 50 % of the time. She states she is exercising 0 minutes 0 days per week.   Interval History:  Since last office visit she has gained 3 pound.  She has been helping to care for her father and has been struggling with staying on the plan.  Her mother is having knee replacement July 3rd and she will be helping to care for her.  She is going on a cruise this week.     Pharmacotherapy for weight loss: She is not currently taking medications  for medical weight loss.    Previous pharmacotherapy for medical weight loss:  none  Bariatric surgery:    has not had bariatric surgery.   Pharmacotherapy for DMT2:  She is currently taking Ozempic 1mg .  Reports side effects of constipation.    Last A1c was 6.0 She is not checking BS at home.   Episodes of hypoglycemia: no On ACE or ARB, ASA 81mg  and statin.  Last eye exam:  12/23 She has tried Metformin and Rybelsus in the past.  She stopped Metformin due to side effects.    Lab Results  Component Value  Date   HGBA1C 6.0 (H) 03/30/2022   HGBA1C 6.0 08/12/2021   HGBA1C 6.5 12/03/2020   Lab Results  Component Value Date   MICROALBUR <0.7 06/11/2020   LDLCALC 124 (H) 03/30/2022   CREATININE 0.76 03/30/2022    Eating disorder/emotional eating Rosamaria has had issues with stress/emotional eating.  Struggling with some stress and cravings.    Overall mood is stable. Medication(s): Wellbutrin XL 150mg .  Denies side effects.    Vit D deficiency  She is taking Vit D 50,000 IU every 2 weeks.  Denies side effects.  Denies nausea, vomiting or muscle weakness.    Lab Results  Component Value Date   VD25OH 60.7 03/30/2022   VD25OH 48.71 08/12/2021   VD25OH 33.92 12/03/2020     PHYSICAL EXAM:  Blood pressure 132/81, pulse 84, temperature 98.5 F (36.9 C), height 5\' 6"  (1.676 m), weight 231 lb (104.8 kg), last menstrual period 08/13/2014, SpO2 99 %. Body mass index is 37.28 kg/m.  General: She is overweight, cooperative, alert, well developed, and in no acute distress. PSYCH: Has normal mood, affect and thought process.   Extremities: No edema.  Neurologic: No gross sensory or motor deficits. No tremors or fasciculations noted.    DIAGNOSTIC DATA REVIEWED:  BMET    Component Value Date/Time   NA 142 03/30/2022 0750   K 4.4 03/30/2022 0750  CL 104 03/30/2022 0750   CO2 22 03/30/2022 0750   GLUCOSE 87 03/30/2022 0750   GLUCOSE 87 08/12/2021 0937   BUN 12 03/30/2022 0750   CREATININE 0.76 03/30/2022 0750   CREATININE 0.81 05/01/2013 1105   CALCIUM 9.6 03/30/2022 0750   GFRNONAA 106 08/03/2019 1356   GFRAA 123 08/03/2019 1356   Lab Results  Component Value Date   HGBA1C 6.0 (H) 03/30/2022   HGBA1C 5.8 (H) 11/05/2016   Lab Results  Component Value Date   INSULIN 19.9 03/30/2022   INSULIN 12.9 11/05/2016   Lab Results  Component Value Date   TSH 2.47 08/12/2021   CBC    Component Value Date/Time   WBC 7.3 08/12/2021 0937   RBC 4.37 08/12/2021 0937   HGB 13.3  08/12/2021 0937   HGB 13.4 11/05/2016 0948   HCT 40.7 08/12/2021 0937   HCT 40.1 11/05/2016 0948   PLT 412.0 (H) 08/12/2021 0937   MCV 93.2 08/12/2021 0937   MCV 93 11/05/2016 0948   MCH 31.1 11/05/2016 0948   MCH 27.2 08/31/2014 0440   MCHC 32.7 08/12/2021 0937   RDW 15.3 08/12/2021 0937   RDW 14.5 11/05/2016 0948   Iron Studies No results found for: "IRON", "TIBC", "FERRITIN", "IRONPCTSAT" Lipid Panel     Component Value Date/Time   CHOL 192 03/30/2022 0750   TRIG 143 03/30/2022 0750   HDL 42 03/30/2022 0750   CHOLHDL 3 08/12/2021 0937   VLDL 27.2 08/12/2021 0937   LDLCALC 124 (H) 03/30/2022 0750   Hepatic Function Panel     Component Value Date/Time   PROT 7.0 03/30/2022 0750   ALBUMIN 4.6 03/30/2022 0750   AST 17 03/30/2022 0750   ALT 13 03/30/2022 0750   ALKPHOS 67 03/30/2022 0750   BILITOT 0.3 03/30/2022 0750      Component Value Date/Time   TSH 2.47 08/12/2021 0937   Nutritional Lab Results  Component Value Date   VD25OH 60.7 03/30/2022   VD25OH 48.71 08/12/2021   VD25OH 33.92 12/03/2020     ASSESSMENT AND PLAN  TREATMENT PLAN FOR OBESITY:  Recommended Dietary Goals  Carmin is currently in the action stage of change. As such, her goal is to continue weight management plan. She has agreed to following a lower carbohydrate, vegetable and lean protein rich diet plan.  Behavioral Intervention  We discussed the following Behavioral Modification Strategies today: increasing lean protein intake, decreasing simple carbohydrates , increasing vegetables, increasing lower glycemic fruits, increasing fiber rich foods, avoiding skipping meals, increasing water intake, continue to practice mindfulness when eating, and planning for success.  Additional resources provided today: NA  Recommended Physical Activity Goals  Floree has been advised to work up to 150 minutes of moderate intensity aerobic activity a week and strengthening exercises 2-3 times per week for  cardiovascular health, weight loss maintenance and preservation of muscle mass.   She has agreed to Think about ways to increase daily physical activity and overcoming barriers to exercise   ASSOCIATED CONDITIONS ADDRESSED TODAY  Action/Plan  Type 2 diabetes mellitus with other specified complication, without long-term current use of insulin (HCC) -    Continue Semaglutide (1 MG/DOSE); Inject 1 mg into the skin once a week.  Dispense: 3 mL; Refill: 0.  Side effects discussed.   Drug-induced constipation -   Start  linaCLOtide; Take 1 capsule (72 mcg total) by mouth daily before breakfast.  Dispense: 30 capsule; Refill: 0.  Side effects discussed.    Other Depression with  emotional eating -    continue buPROPion HCl ER (XL); Take 1 tablet (150 mg total) by mouth daily.  Dispense: 30 tablet; Refill: 0. Side effects discussed.   Vitamin D deficiency -     Vitamin D (Ergocalciferol); Take 1 capsule (50,000 Units total) by mouth every 14 (fourteen) days.  Dispense: 4 capsule; Refill: 0.  Side effects discussed.   Generalized obesity  BMI 37.0-37.9, adult     Next visit I would like to either increase Ozempic or switch to [facility] based upon how she's doing.       No follow-ups on file.Marland Kitchen She was informed of the importance of frequent follow up visits to maximize her success with intensive lifestyle modifications for her multiple health conditions.   ATTESTASTION STATEMENTS:  Reviewed by clinician on day of visit: allergies, medications, problem list, medical history, surgical history, family history, social history, and previous encounter notes.     Theodis Sato. Ermalinda Joubert FNP-C

## 2022-07-17 ENCOUNTER — Ambulatory Visit: Payer: BC Managed Care – PPO | Admitting: Family Medicine

## 2022-08-13 ENCOUNTER — Ambulatory Visit: Payer: BC Managed Care – PPO | Admitting: Nurse Practitioner

## 2022-08-20 ENCOUNTER — Other Ambulatory Visit (HOSPITAL_BASED_OUTPATIENT_CLINIC_OR_DEPARTMENT_OTHER): Payer: Self-pay

## 2022-08-20 ENCOUNTER — Ambulatory Visit: Payer: BC Managed Care – PPO | Admitting: Nurse Practitioner

## 2022-08-20 ENCOUNTER — Encounter: Payer: Self-pay | Admitting: Nurse Practitioner

## 2022-08-20 VITALS — BP 150/92 | HR 90 | Temp 98.2°F | Ht 66.0 in | Wt 239.0 lb

## 2022-08-20 DIAGNOSIS — E559 Vitamin D deficiency, unspecified: Secondary | ICD-10-CM | POA: Diagnosis not present

## 2022-08-20 DIAGNOSIS — K5903 Drug induced constipation: Secondary | ICD-10-CM

## 2022-08-20 DIAGNOSIS — E1169 Type 2 diabetes mellitus with other specified complication: Secondary | ICD-10-CM | POA: Diagnosis not present

## 2022-08-20 DIAGNOSIS — F3289 Other specified depressive episodes: Secondary | ICD-10-CM | POA: Diagnosis not present

## 2022-08-20 DIAGNOSIS — Z7985 Long-term (current) use of injectable non-insulin antidiabetic drugs: Secondary | ICD-10-CM

## 2022-08-20 DIAGNOSIS — Z6838 Body mass index (BMI) 38.0-38.9, adult: Secondary | ICD-10-CM

## 2022-08-20 DIAGNOSIS — Z6837 Body mass index (BMI) 37.0-37.9, adult: Secondary | ICD-10-CM

## 2022-08-20 DIAGNOSIS — E669 Obesity, unspecified: Secondary | ICD-10-CM

## 2022-08-20 MED ORDER — TIRZEPATIDE 2.5 MG/0.5ML ~~LOC~~ SOAJ
2.5000 mg | SUBCUTANEOUS | 0 refills | Status: DC
  Filled 2022-08-20: qty 2, 28d supply, fill #0

## 2022-08-20 MED ORDER — VITAMIN D (ERGOCALCIFEROL) 1.25 MG (50000 UNIT) PO CAPS
50000.0000 [IU] | ORAL_CAPSULE | ORAL | 0 refills | Status: DC
  Filled 2022-08-20: qty 4, 56d supply, fill #0

## 2022-08-20 MED ORDER — LINACLOTIDE 72 MCG PO CAPS
72.0000 ug | ORAL_CAPSULE | Freq: Every day | ORAL | 0 refills | Status: DC
  Filled 2022-08-20: qty 30, 30d supply, fill #0

## 2022-08-20 MED ORDER — BUPROPION HCL ER (XL) 150 MG PO TB24
150.0000 mg | ORAL_TABLET | Freq: Every day | ORAL | 0 refills | Status: DC
  Filled 2022-08-20: qty 30, 30d supply, fill #0

## 2022-08-20 NOTE — Patient Instructions (Signed)

## 2022-08-20 NOTE — Progress Notes (Signed)
Office: [phone]  /  Fax: [phone]  WEIGHT SUMMARY AND BIOMETRICS  Weight Lost Since Last Visit: 0lb  Weight Gained Since Last Visit: 11lb   Vitals Temp: 98.2 F (36.8 C) BP: (!) 150/92 Pulse Rate: 90 SpO2: 99 %   Anthropometric Measurements Height: 5\' 6"  (1.676 m) Weight: 239 lb (108.4 kg) BMI (Calculated): 38.59 Weight at Last Visit: 228lb Weight Lost Since Last Visit: 0lb Weight Gained Since Last Visit: 11lb Starting Weight: 240 Total Weight Loss (lbs): 1 lb (0.454 kg)   Body Composition  Body Fat %: 48 % Fat Mass (lbs): 115.2 lbs Muscle Mass (lbs): 118.4 lbs Total Body Water (lbs): 86.8 lbs Visceral Fat Rating : 14   Other Clinical Data Fasting: No Labs: No Today's Visit #: 32 Starting Date: 08/03/19     HPI  Chief Complaint: OBESITY  [name] is here to discuss her progress with her obesity treatment plan. She is on the following a lower carbohydrate, vegetable and lean protein rich diet plan and states she is following her eating plan approximately 20 % of the time. She states she is exercising 0 minutes 0 days per week.   Interval History:  Since last office visit she has gained 11 pounds.  She has been on a cruise and caring for her parents.  Her mother had a total knee replacement.  She is starting back to work next week and feels that will help her stay on a schedule/stay on track.     Pharmacotherapy for weight loss: She is not currently taking medications  for medical weight loss.     Previous pharmacotherapy for medical weight loss:  none  Bariatric surgery:  Tyler has not had bariatric surgery.    Pharmacotherapy for DMT2:  She is not taking Ozempic.  Stopped taking it a month ago. Had side effects of constipation with Ozempic.  Was started on Linzess 72 mcg and noted it was helpful.  Denies side effects.  Requesting refills Notes polyphagia, polydipsia and polyuria since stopping Ozempic  Last A1c was 6.0 She is not checking BS  at home.   Episodes of hypoglycemia: no On ACE or ARB, ASA 81mg  and statin.  Last eye exam:  12/23 She has tried Metformin and Rybelsus in the past.  Stopped Metformin due to side effects.    Lab Results  Component Value Date   HGBA1C 6.0 (H) 03/30/2022   HGBA1C 6.0 08/12/2021   HGBA1C 6.5 12/03/2020   Lab Results  Component Value Date   MICROALBUR <0.7 06/11/2020   LDLCALC 124 (H) 03/30/2022   CREATININE 0.76 03/30/2022    Depression with emotional eating Taking Wellbutrin XL 150mg   Denies side effects, requesting refill.    Vit D deficiency  She is taking Vit D 50,000 IU every 2 weeks.  Denies side effects.  Denies nausea, vomiting or muscle weakness.    Lab Results  Component Value Date   VD25OH 60.7 03/30/2022   VD25OH 48.71 08/12/2021   VD25OH 33.92 12/03/2020     PHYSICAL EXAM:  Blood pressure (!) 150/92, pulse 90, temperature 98.2 F (36.8 C), height 5\' 6"  (1.676 m), weight 239 lb (108.4 kg), last menstrual period 08/13/2014, SpO2 99%. Body mass index is 38.58 kg/m.  General: She is overweight, cooperative, alert, well developed, and in no acute distress. PSYCH: Has normal mood, affect and thought process.   Extremities: No edema.  Neurologic: No gross sensory or motor deficits. No tremors or fasciculations noted.    DIAGNOSTIC DATA REVIEWED:  BMET    Component Value Date/Time   NA 142 03/30/2022 0750   K 4.4 03/30/2022 0750   CL 104 03/30/2022 0750   CO2 22 03/30/2022 0750   GLUCOSE 87 03/30/2022 0750   GLUCOSE 87 08/12/2021 0937   BUN 12 03/30/2022 0750   CREATININE 0.76 03/30/2022 0750   CREATININE 0.81 05/01/2013 1105   CALCIUM 9.6 03/30/2022 0750   GFRNONAA 106 08/03/2019 1356   GFRAA 123 08/03/2019 1356   Lab Results  Component Value Date   HGBA1C 6.0 (H) 03/30/2022   HGBA1C 5.8 (H) 11/05/2016   Lab Results  Component Value Date   INSULIN 19.9 03/30/2022   INSULIN 12.9 11/05/2016   Lab Results  Component Value Date   TSH 2.47  08/12/2021   CBC    Component Value Date/Time   WBC 7.3 08/12/2021 0937   RBC 4.37 08/12/2021 0937   HGB 13.3 08/12/2021 0937   HGB 13.4 11/05/2016 0948   HCT 40.7 08/12/2021 0937   HCT 40.1 11/05/2016 0948   PLT 412.0 (H) 08/12/2021 0937   MCV 93.2 08/12/2021 0937   MCV 93 11/05/2016 0948   MCH 31.1 11/05/2016 0948   MCH 27.2 08/31/2014 0440   MCHC 32.7 08/12/2021 0937   RDW 15.3 08/12/2021 0937   RDW 14.5 11/05/2016 0948   Iron Studies No results found for: "IRON", "TIBC", "FERRITIN", "IRONPCTSAT" Lipid Panel     Component Value Date/Time   CHOL 192 03/30/2022 0750   TRIG 143 03/30/2022 0750   HDL 42 03/30/2022 0750   CHOLHDL 3 08/12/2021 0937   VLDL 27.2 08/12/2021 0937   LDLCALC 124 (H) 03/30/2022 0750   Hepatic Function Panel     Component Value Date/Time   PROT 7.0 03/30/2022 0750   ALBUMIN 4.6 03/30/2022 0750   AST 17 03/30/2022 0750   ALT 13 03/30/2022 0750   ALKPHOS 67 03/30/2022 0750   BILITOT 0.3 03/30/2022 0750      Component Value Date/Time   TSH 2.47 08/12/2021 0937   Nutritional Lab Results  Component Value Date   VD25OH 60.7 03/30/2022   VD25OH 48.71 08/12/2021   VD25OH 33.92 12/03/2020     ASSESSMENT AND PLAN  TREATMENT PLAN FOR OBESITY:  Recommended Dietary Goals  Judythe is currently in the action stage of change. As such, her goal is to continue weight management plan. She has agreed to keeping a food journal and adhering to recommended goals of 1500-1600 calories and 90+ protein.  Behavioral Intervention  We discussed the following Behavioral Modification Strategies today: increasing lean protein intake, decreasing simple carbohydrates , increasing vegetables, increasing lower glycemic fruits, increasing water intake, work on tracking and journaling calories using tracking application, continue to practice mindfulness when eating, and planning for success.  Additional resources provided today: NA  Recommended Physical Activity  Goals  Melanye has been advised to work up to 150 minutes of moderate intensity aerobic activity a week and strengthening exercises 2-3 times per week for cardiovascular health, weight loss maintenance and preservation of muscle mass.   She has agreed to Think about ways to increase daily physical activity and overcoming barriers to exercise and Increase physical activity in their day and reduce sedentary time (increase NEAT).   ASSOCIATED CONDITIONS ADDRESSED TODAY  Action/Plan  Type 2 diabetes mellitus with other specified complication, without long-term current use of insulin (HCC) -     Start Tirzepatide; Inject 2.5 mg into the skin once a week.  Dispense: 2 mL; Refill: 0.  Side  effects discussed.  Patient has had a hysterectomy.    Contraindications:  denies  Pancreatitis (active gallstones) Medullary thyroid cancer High triglycerides (>500)-will need labs prior to starting Multiple Endocrine Neoplasia syndrome type 2 (MEN 2) Trying to get pregnant Breastfeeding Use with caution with taking insulin or sulfonylureas (will need to monitor blood sugars for hypoglycemia)  Good blood sugar control is important to decrease the likelihood of diabetic complications such as nephropathy, neuropathy, limb loss, blindness, coronary artery disease, and death. Intensive lifestyle modification including diet, exercise and weight loss are the first line of treatment for diabetes.    Other Depression with emotional eating -     buPROPion HCl ER (XL); Take 1 tablet (150 mg total) by mouth daily.  Dispense: 30 tablet; Refill: 0  Vitamin D deficiency -     Vitamin D (Ergocalciferol); Take 1 capsule (50,000 Units total) by mouth every 14 (fourteen) days.  Dispense: 4 capsule; Refill: 0  Drug-induced constipation -     linaCLOtide; Take 1 capsule (72 mcg total) by mouth daily before breakfast.  Dispense: 30 capsule; Refill: 0  Generalized obesity  BMI 37.0-37.9, adult      Will recheck labs in  Sept.    Return in about 4 weeks (around 09/17/2022).Marland Kitchen She was informed of the importance of frequent follow up visits to maximize her success with intensive lifestyle modifications for her multiple health conditions.   ATTESTASTION STATEMENTS:  Reviewed by clinician on day of visit: allergies, medications, problem list, medical history, surgical history, family history, social history, and previous encounter notes.     Theodis Sato. Iasha Mccalister FNP-C

## 2022-09-16 ENCOUNTER — Telehealth (INDEPENDENT_AMBULATORY_CARE_PROVIDER_SITE_OTHER): Payer: Self-pay | Admitting: Nurse Practitioner

## 2022-09-16 ENCOUNTER — Other Ambulatory Visit (HOSPITAL_BASED_OUTPATIENT_CLINIC_OR_DEPARTMENT_OTHER): Payer: Self-pay

## 2022-09-16 DIAGNOSIS — E1169 Type 2 diabetes mellitus with other specified complication: Secondary | ICD-10-CM

## 2022-09-16 MED ORDER — TIRZEPATIDE 2.5 MG/0.5ML ~~LOC~~ SOAJ
2.5000 mg | SUBCUTANEOUS | 0 refills | Status: DC
  Filled 2022-09-16: qty 2, 28d supply, fill #0

## 2022-09-16 NOTE — Telephone Encounter (Signed)
Patient called requesting a refill of Mounjaro. Pt is taking her last on tomorrow. I advised the patient that she would need an appt. Scheduled pt for appt on Monday 08/26. Pt wants to know if [name] would refill medication before 08/26 appt. Please call pt at number on file. [facility].

## 2022-09-16 NOTE — Telephone Encounter (Signed)
Sent in

## 2022-09-20 ENCOUNTER — Encounter: Payer: Self-pay | Admitting: Nurse Practitioner

## 2022-09-21 ENCOUNTER — Ambulatory Visit: Payer: BC Managed Care – PPO | Admitting: Nurse Practitioner

## 2022-09-23 NOTE — Telephone Encounter (Signed)
NA

## 2022-10-12 ENCOUNTER — Other Ambulatory Visit (HOSPITAL_BASED_OUTPATIENT_CLINIC_OR_DEPARTMENT_OTHER): Payer: Self-pay

## 2022-10-12 ENCOUNTER — Ambulatory Visit: Payer: BC Managed Care – PPO | Admitting: Nurse Practitioner

## 2022-10-12 ENCOUNTER — Encounter: Payer: Self-pay | Admitting: Nurse Practitioner

## 2022-10-12 VITALS — BP 133/83 | HR 81 | Temp 98.5°F | Ht 66.0 in | Wt 240.0 lb

## 2022-10-12 DIAGNOSIS — I10 Essential (primary) hypertension: Secondary | ICD-10-CM

## 2022-10-12 DIAGNOSIS — F3289 Other specified depressive episodes: Secondary | ICD-10-CM

## 2022-10-12 DIAGNOSIS — E1169 Type 2 diabetes mellitus with other specified complication: Secondary | ICD-10-CM

## 2022-10-12 DIAGNOSIS — E785 Hyperlipidemia, unspecified: Secondary | ICD-10-CM | POA: Diagnosis not present

## 2022-10-12 DIAGNOSIS — Z7985 Long-term (current) use of injectable non-insulin antidiabetic drugs: Secondary | ICD-10-CM

## 2022-10-12 DIAGNOSIS — Z6838 Body mass index (BMI) 38.0-38.9, adult: Secondary | ICD-10-CM

## 2022-10-12 DIAGNOSIS — E559 Vitamin D deficiency, unspecified: Secondary | ICD-10-CM | POA: Diagnosis not present

## 2022-10-12 DIAGNOSIS — Z79899 Other long term (current) drug therapy: Secondary | ICD-10-CM

## 2022-10-12 MED ORDER — BUPROPION HCL ER (XL) 300 MG PO TB24
300.0000 mg | ORAL_TABLET | Freq: Every day | ORAL | 0 refills | Status: DC
  Filled 2022-10-12 – 2022-10-22 (×2): qty 30, 30d supply, fill #0

## 2022-10-12 MED ORDER — TIRZEPATIDE 5 MG/0.5ML ~~LOC~~ SOAJ
5.0000 mg | SUBCUTANEOUS | 0 refills | Status: DC
  Filled 2022-10-12 – 2022-10-22 (×2): qty 2, 28d supply, fill #0

## 2022-10-12 NOTE — Progress Notes (Signed)
Office: [phone]  /  Fax: [phone]  WEIGHT SUMMARY AND BIOMETRICS  Weight Lost Since Last Visit: 0lb  Weight Gained Since Last Visit: 1lb   Vitals Temp: 98.5 F (36.9 C) BP: 133/83 Pulse Rate: 81 SpO2: 97 %   Anthropometric Measurements Height: 5\' 6"  (1.676 m) Weight: 240 lb (108.9 kg) BMI (Calculated): 38.76 Weight at Last Visit: 239lb Weight Lost Since Last Visit: 0lb Weight Gained Since Last Visit: 1lb Starting Weight: 240lb Total Weight Loss (lbs): 0 lb (0 kg)   Body Composition  Body Fat %: 46.5 % Fat Mass (lbs): 111.6 lbs Muscle Mass (lbs): 122.2 lbs Total Body Water (lbs): 87 lbs Visceral Fat Rating : 13   Other Clinical Data Fasting: No Labs: No Today's Visit #: 33 Starting Date: 08/03/19     HPI  Chief Complaint: OBESITY  Mikele is here to discuss her progress with her obesity treatment plan. She is on the keeping a food journal and adhering to recommended goals of 1600-1700 calories and 100 protein and states she is following her eating plan approximately 70 % of the time. She states she is exercising 30 minutes 2 days per week.   Interval History:  Since last office visit on 08/20/22 she has gained 1 pound.  She is trying to walk at school.  She is drinking water and sometimes coke.  She is struggling with cravings and polyphagia.     Pharmacotherapy for weight loss: She is not currently taking medications  for medical weight loss.    Previous pharmacotherapy for medical weight loss:  None  Bariatric surgery:  Patient has not had bariatric surgery   Pharmacotherapy for DMT2:  She is taking Mounjaro 2.5mg .  Denies side effects.    Last A1c was 6.0 She is not checking BS at home.   Episodes of hypoglycemia: no On statin.  Last eye exam:  12/23 She has tried Metformin, Ozempic and Rybelsus in the past.  Stopped Metformin due to side effects.  Stopped Ozempic due to side effects of constipation Struggling with polyphagia and  cravings.   Hypertension Hypertension BP looks better today.  Medication(s): Norvasc 10mg , Bystolic 10mg  Denies chest pain, palpitations and SOB.  BP Readings from Last 3 Encounters:  10/12/22 133/83  08/20/22 (!) 150/92  07/14/22 132/81   Lab Results  Component Value Date   CREATININE 0.76 03/30/2022   CREATININE 0.73 08/12/2021   CREATININE 0.77 12/03/2020     Hyperlipidemia Medication(s): Crestor 10mg . Denies side effects.   Cardiovascular risk factors: diabetes mellitus, dyslipidemia, hypertension, and obesity (BMI >= 30 kg/m2)  Lab Results  Component Value Date   CHOL 192 03/30/2022   HDL 42 03/30/2022   LDLCALC 124 (H) 03/30/2022   TRIG 143 03/30/2022   CHOLHDL 3 08/12/2021   Lab Results  Component Value Date   ALT 13 03/30/2022   AST 17 03/30/2022   ALKPHOS 67 03/30/2022   BILITOT 0.3 03/30/2022   The 10-year ASCVD risk score (Arnett DK, et al., 2019) is: 13.5%   Values used to calculate the score:     Age: 52 years     Sex: Female     Is Non-Hispanic African American: Yes     Diabetic: Yes     Tobacco smoker: No     Systolic Blood Pressure: 133 mmHg     Is BP treated: Yes     HDL Cholesterol: 42 mg/dL     Total Cholesterol: 192 mg/dL   Vit D deficiency  She  is taking Vit D 50,000 international units  every 2 weeks.  Denies side effects.  Denies nausea, vomiting or muscle weakness.    Lab Results  Component Value Date   VD25OH 60.7 03/30/2022   VD25OH 48.71 08/12/2021   VD25OH 33.92 12/03/2020   Depression with emotional eating Taking Wellbutrin XL 150mg   Denies side effects, requesting refill and to increase dose due to cravings.      PHYSICAL EXAM:  Blood pressure 133/83, pulse 81, temperature 98.5 F (36.9 C), height 5\' 6"  (1.676 m), weight 240 lb (108.9 kg), last menstrual period 08/13/2014, SpO2 97%. Body mass index is 38.74 kg/m.  General: She is overweight, cooperative, alert, well developed, and in no acute distress. PSYCH: Has  normal mood, affect and thought process.   Extremities: No edema.  Neurologic: No gross sensory or motor deficits. No tremors or fasciculations noted.    DIAGNOSTIC DATA REVIEWED:  BMET    Component Value Date/Time   NA 142 03/30/2022 0750   K 4.4 03/30/2022 0750   CL 104 03/30/2022 0750   CO2 22 03/30/2022 0750   GLUCOSE 87 03/30/2022 0750   GLUCOSE 87 08/12/2021 0937   BUN 12 03/30/2022 0750   CREATININE 0.76 03/30/2022 0750   CREATININE 0.81 05/01/2013 1105   CALCIUM 9.6 03/30/2022 0750   GFRNONAA 106 08/03/2019 1356   GFRAA 123 08/03/2019 1356   Lab Results  Component Value Date   HGBA1C 6.0 (H) 03/30/2022   HGBA1C 5.8 (H) 11/05/2016   Lab Results  Component Value Date   INSULIN 19.9 03/30/2022   INSULIN 12.9 11/05/2016   Lab Results  Component Value Date   TSH 2.47 08/12/2021   CBC    Component Value Date/Time   WBC 7.3 08/12/2021 0937   RBC 4.37 08/12/2021 0937   HGB 13.3 08/12/2021 0937   HGB 13.4 11/05/2016 0948   HCT 40.7 08/12/2021 0937   HCT 40.1 11/05/2016 0948   PLT 412.0 (H) 08/12/2021 0937   MCV 93.2 08/12/2021 0937   MCV 93 11/05/2016 0948   MCH 31.1 11/05/2016 0948   MCH 27.2 08/31/2014 0440   MCHC 32.7 08/12/2021 0937   RDW 15.3 08/12/2021 0937   RDW 14.5 11/05/2016 0948   Iron Studies No results found for: "IRON", "TIBC", "FERRITIN", "IRONPCTSAT" Lipid Panel     Component Value Date/Time   CHOL 192 03/30/2022 0750   TRIG 143 03/30/2022 0750   HDL 42 03/30/2022 0750   CHOLHDL 3 08/12/2021 0937   VLDL 27.2 08/12/2021 0937   LDLCALC 124 (H) 03/30/2022 0750   Hepatic Function Panel     Component Value Date/Time   PROT 7.0 03/30/2022 0750   ALBUMIN 4.6 03/30/2022 0750   AST 17 03/30/2022 0750   ALT 13 03/30/2022 0750   ALKPHOS 67 03/30/2022 0750   BILITOT 0.3 03/30/2022 0750      Component Value Date/Time   TSH 2.47 08/12/2021 0937   Nutritional Lab Results  Component Value Date   VD25OH 60.7 03/30/2022   VD25OH 48.71  08/12/2021   VD25OH 33.92 12/03/2020     ASSESSMENT AND PLAN  TREATMENT PLAN FOR OBESITY:  Recommended Dietary Goals  Freidy is currently in the action stage of change. As such, her goal is to continue weight management plan. She has agreed to keeping a food journal and adhering to recommended goals of 1600 calories and 90+ protein.  Behavioral Intervention  We discussed the following Behavioral Modification Strategies today: increasing lean protein intake, decreasing simple carbohydrates ,  increasing vegetables, increasing lower glycemic fruits, increasing water intake, work on tracking and journaling calories using tracking application, continue to practice mindfulness when eating, and planning for success.  Additional resources provided today: NA  Recommended Physical Activity Goals  Quaniqua has been advised to work up to 150 minutes of moderate intensity aerobic activity a week and strengthening exercises 2-3 times per week for cardiovascular health, weight loss maintenance and preservation of muscle mass.   She has agreed to Think about ways to increase daily physical activity and overcoming barriers to exercise and Increase physical activity in their day and reduce sedentary time (increase NEAT).    ASSOCIATED CONDITIONS ADDRESSED TODAY  Action/Plan  Type 2 diabetes mellitus with other specified complication, without long-term current use of insulin (HCC) -     Hemoglobin A1c -     Tirzepatide; Inject 5 mg into the skin once a week.  Dispense: 2 mL; Refill: 0  Essential hypertension -     CBC with Differential/Platelet  Vitamin D deficiency -     VITAMIN D 25 Hydroxy (Vit-D Deficiency, Fractures)  Hyperlipidemia associated with type 2 diabetes mellitus (HCC) -     Lipid Panel With LDL/HDL Ratio -     CBC with Differential/Platelet  Other Depression with emotional eating -     buPROPion HCl ER (XL); Take 1 tablet (300 mg total) by mouth daily.  Dispense: 30 tablet;  Refill: 0  Medication management -     Comprehensive metabolic panel -     CBC with Differential/Platelet  Morbid obesity (HCC) -     TSH -     CBC with Differential/Platelet  BMI 38.0-38.9,adult -     TSH -     CBC with Differential/Platelet         Return in about 4 weeks (around 11/09/2022).Marland Kitchen She was informed of the importance of frequent follow up visits to maximize her success with intensive lifestyle modifications for her multiple health conditions.   ATTESTASTION STATEMENTS:  Reviewed by clinician on day of visit: allergies, medications, problem list, medical history, surgical history, family history, social history, and previous encounter notes.      Theodis Sato. Danford Tat FNP-C

## 2022-10-13 LAB — COMPREHENSIVE METABOLIC PANEL
ALT: 14 IU/L (ref 0–32)
AST: 11 IU/L (ref 0–40)
Albumin: 4.2 g/dL (ref 3.8–4.9)
Alkaline Phosphatase: 70 IU/L (ref 44–121)
BUN/Creatinine Ratio: 18 (ref 9–23)
BUN: 14 mg/dL (ref 6–24)
Bilirubin Total: 0.3 mg/dL (ref 0.0–1.2)
CO2: 24 mmol/L (ref 20–29)
Calcium: 9.7 mg/dL (ref 8.7–10.2)
Chloride: 103 mmol/L (ref 96–106)
Creatinine, Ser: 0.76 mg/dL (ref 0.57–1.00)
Globulin, Total: 2.5 g/dL (ref 1.5–4.5)
Glucose: 93 mg/dL (ref 70–99)
Potassium: 4.5 mmol/L (ref 3.5–5.2)
Sodium: 144 mmol/L (ref 134–144)
Total Protein: 6.7 g/dL (ref 6.0–8.5)
eGFR: 95 mL/min/{1.73_m2} (ref >59)

## 2022-10-13 LAB — HEMOGLOBIN A1C
Est. average glucose Bld gHb Est-mCnc: 131 mg/dL
Hgb A1c MFr Bld: 6.2 % — ABNORMAL HIGH (ref 4.8–5.6)

## 2022-10-13 LAB — CBC WITH DIFFERENTIAL/PLATELET
Basophils Absolute: 0.1 10*3/uL (ref 0.0–0.2)
Basos: 1 %
EOS (ABSOLUTE): 0.1 10*3/uL (ref 0.0–0.4)
Eos: 2 %
Hematocrit: 41.2 % (ref 34.0–46.6)
Hemoglobin: 13.3 g/dL (ref 11.1–15.9)
Immature Grans (Abs): 0 10*3/uL (ref 0.0–0.1)
Immature Granulocytes: 0 %
Lymphocytes Absolute: 2.9 10*3/uL (ref 0.7–3.1)
Lymphs: 44 %
MCH: 30.2 pg (ref 26.6–33.0)
MCHC: 32.3 g/dL (ref 31.5–35.7)
MCV: 93 fL (ref 79–97)
Monocytes Absolute: 0.4 10*3/uL (ref 0.1–0.9)
Monocytes: 6 %
Neutrophils Absolute: 3.1 10*3/uL (ref 1.4–7.0)
Neutrophils: 47 %
Platelets: 411 10*3/uL (ref 150–450)
RBC: 4.41 x10E6/uL (ref 3.77–5.28)
RDW: 12.9 % (ref 11.7–15.4)
WBC: 6.6 10*3/uL (ref 3.4–10.8)

## 2022-10-13 LAB — TSH: TSH: 1.66 u[IU]/mL (ref 0.450–4.500)

## 2022-10-13 LAB — LIPID PANEL WITH LDL/HDL RATIO
Cholesterol, Total: 203 mg/dL — ABNORMAL HIGH (ref 100–199)
HDL: 43 mg/dL (ref >39)
LDL Chol Calc (NIH): 133 mg/dL — ABNORMAL HIGH (ref 0–99)
LDL/HDL Ratio: 3.1 ratio (ref 0.0–3.2)
Triglycerides: 150 mg/dL — ABNORMAL HIGH (ref 0–149)
VLDL Cholesterol Cal: 27 mg/dL (ref 5–40)

## 2022-10-13 LAB — VITAMIN D 25 HYDROXY (VIT D DEFICIENCY, FRACTURES): Vit D, 25-Hydroxy: 41.9 ng/mL (ref 30.0–100.0)

## 2022-10-19 ENCOUNTER — Other Ambulatory Visit: Payer: Self-pay | Admitting: Nurse Practitioner

## 2022-10-19 ENCOUNTER — Encounter: Payer: Self-pay | Admitting: Nurse Practitioner

## 2022-10-19 DIAGNOSIS — E559 Vitamin D deficiency, unspecified: Secondary | ICD-10-CM

## 2022-10-20 ENCOUNTER — Other Ambulatory Visit: Payer: Self-pay | Admitting: Nurse Practitioner

## 2022-10-20 ENCOUNTER — Other Ambulatory Visit (HOSPITAL_BASED_OUTPATIENT_CLINIC_OR_DEPARTMENT_OTHER): Payer: Self-pay

## 2022-10-20 DIAGNOSIS — K5903 Drug induced constipation: Secondary | ICD-10-CM

## 2022-10-20 DIAGNOSIS — E559 Vitamin D deficiency, unspecified: Secondary | ICD-10-CM

## 2022-10-20 MED ORDER — VITAMIN D (ERGOCALCIFEROL) 1.25 MG (50000 UNIT) PO CAPS
50000.0000 [IU] | ORAL_CAPSULE | ORAL | 0 refills | Status: DC
  Filled 2022-10-20 – 2022-10-22 (×2): qty 4, 56d supply, fill #0

## 2022-10-20 MED ORDER — LINACLOTIDE 72 MCG PO CAPS
72.0000 ug | ORAL_CAPSULE | Freq: Every day | ORAL | 0 refills | Status: DC
  Filled 2022-10-20 – 2022-10-22 (×2): qty 30, 30d supply, fill #0

## 2022-10-21 ENCOUNTER — Other Ambulatory Visit (HOSPITAL_BASED_OUTPATIENT_CLINIC_OR_DEPARTMENT_OTHER): Payer: Self-pay

## 2022-10-22 ENCOUNTER — Other Ambulatory Visit (HOSPITAL_BASED_OUTPATIENT_CLINIC_OR_DEPARTMENT_OTHER): Payer: Self-pay

## 2022-10-26 ENCOUNTER — Other Ambulatory Visit (HOSPITAL_BASED_OUTPATIENT_CLINIC_OR_DEPARTMENT_OTHER): Payer: Self-pay

## 2022-10-26 MED ORDER — AMOXICILLIN 500 MG PO CAPS
2000.0000 mg | ORAL_CAPSULE | ORAL | 0 refills | Status: DC
  Filled 2022-10-26: qty 8, 2d supply, fill #0

## 2022-11-08 ENCOUNTER — Encounter: Payer: Self-pay | Admitting: Nurse Practitioner

## 2022-11-09 ENCOUNTER — Ambulatory Visit: Payer: BC Managed Care – PPO | Admitting: Nurse Practitioner

## 2022-11-25 ENCOUNTER — Other Ambulatory Visit (HOSPITAL_BASED_OUTPATIENT_CLINIC_OR_DEPARTMENT_OTHER): Payer: Self-pay

## 2022-11-25 ENCOUNTER — Encounter: Payer: Self-pay | Admitting: Nurse Practitioner

## 2022-11-25 ENCOUNTER — Other Ambulatory Visit: Payer: Self-pay

## 2022-11-25 ENCOUNTER — Ambulatory Visit: Payer: BC Managed Care – PPO | Admitting: Nurse Practitioner

## 2022-11-25 VITALS — BP 131/81 | HR 80 | Temp 98.6°F | Ht 66.0 in | Wt 240.0 lb

## 2022-11-25 DIAGNOSIS — E1169 Type 2 diabetes mellitus with other specified complication: Secondary | ICD-10-CM

## 2022-11-25 DIAGNOSIS — F3289 Other specified depressive episodes: Secondary | ICD-10-CM | POA: Diagnosis not present

## 2022-11-25 DIAGNOSIS — Z7985 Long-term (current) use of injectable non-insulin antidiabetic drugs: Secondary | ICD-10-CM

## 2022-11-25 DIAGNOSIS — E559 Vitamin D deficiency, unspecified: Secondary | ICD-10-CM

## 2022-11-25 DIAGNOSIS — K5903 Drug induced constipation: Secondary | ICD-10-CM

## 2022-11-25 DIAGNOSIS — E785 Hyperlipidemia, unspecified: Secondary | ICD-10-CM

## 2022-11-25 DIAGNOSIS — Z6838 Body mass index (BMI) 38.0-38.9, adult: Secondary | ICD-10-CM

## 2022-11-25 MED ORDER — VITAMIN D (ERGOCALCIFEROL) 1.25 MG (50000 UNIT) PO CAPS
50000.0000 [IU] | ORAL_CAPSULE | ORAL | 0 refills | Status: DC
  Filled 2022-11-25: qty 6, 84d supply, fill #0

## 2022-11-25 MED ORDER — BUPROPION HCL ER (XL) 300 MG PO TB24
300.0000 mg | ORAL_TABLET | Freq: Every day | ORAL | 0 refills | Status: DC
  Filled 2022-11-25: qty 30, 30d supply, fill #0

## 2022-11-25 MED ORDER — TIRZEPATIDE 7.5 MG/0.5ML ~~LOC~~ SOAJ
7.5000 mg | SUBCUTANEOUS | 0 refills | Status: DC
  Filled 2022-11-25: qty 2, 28d supply, fill #0

## 2022-11-25 MED ORDER — ROSUVASTATIN CALCIUM 10 MG PO TABS
10.0000 mg | ORAL_TABLET | Freq: Every day | ORAL | 0 refills | Status: DC
  Filled 2022-11-25: qty 30, 30d supply, fill #0

## 2022-11-25 MED ORDER — LINACLOTIDE 72 MCG PO CAPS
72.0000 ug | ORAL_CAPSULE | Freq: Every day | ORAL | 0 refills | Status: DC
  Filled 2022-11-25: qty 30, 30d supply, fill #0

## 2022-11-25 NOTE — Progress Notes (Signed)
Office: [phone]  /  Fax: [phone]  WEIGHT SUMMARY AND BIOMETRICS  Weight Lost Since Last Visit: 0lb  Weight Gained Since Last Visit: 0lb   Vitals Temp: 98.6 F (37 C) BP: 131/81 Pulse Rate: 80 SpO2: 98 %   Anthropometric Measurements Height: 5\' 6"  (1.676 m) Weight: 240 lb (108.9 kg) BMI (Calculated): 38.76 Weight at Last Visit: 240lb Weight Lost Since Last Visit: 0lb Weight Gained Since Last Visit: 0lb Starting Weight: 240lb Total Weight Loss (lbs): 0 lb (0 kg)   Body Composition  Body Fat %: 46.9 % Fat Mass (lbs): 112.8 lbs Muscle Mass (lbs): 121.2 lbs Total Body Water (lbs): 85.8 lbs Visceral Fat Rating : 13   Other Clinical Data Fasting: No Labs: No Today's Visit #: 34 Starting Date: 08/03/19     HPI  Chief Complaint: OBESITY  [name] is here to discuss her progress with her obesity treatment plan. She is on the keeping a food journal and adhering to recommended goals of 1600-1700 calories and 100 protein and states she is following her eating plan approximately 70 % of the time. She states she is exercising 0 minutes 0 days per week.   Interval History:  Since last office visit she has maintained her weight.  Struggling with cravings sweets and carbs.   Pharmacotherapy for weight loss: She is not currently taking medications  for medical weight loss.     Previous pharmacotherapy for medical weight loss:  None   Bariatric surgery:  Patient has not had bariatric surgery   Pharmacotherapy for DMT2:  She is currently taking Mounjaro 5mg .  Denies side effects.  Taking Linzess for constipation since starting Mounjaro.  Denies side effects and is doing well.  Requesting refill.  Last A1c was 6.2 She is not checking BS at home.   Episodes of hypoglycemia: no On ACE or ARB, ASA 81mg  and statin.  Last eye exam:  12/23 She has tried Metformin, Ozempic and Rybelsus in the past.   Stopped Metformin due to side effects.  Stopped Ozempic due  to side effects of constipation Struggling with polyphagia and cravings.   Lab Results  Component Value Date   HGBA1C 6.2 (H) 10/12/2022   HGBA1C 6.0 (H) 03/30/2022   HGBA1C 6.0 08/12/2021   Lab Results  Component Value Date   MICROALBUR <0.7 06/11/2020   LDLCALC 133 (H) 10/12/2022   CREATININE 0.76 10/12/2022      Hyperlipidemia Medication(s): Not currently on meds.  Stopped taking Crestor "a long time ago".  Lab Results  Component Value Date   CHOL 203 (H) 10/12/2022   HDL 43 10/12/2022   LDLCALC 133 (H) 10/12/2022   TRIG 150 (H) 10/12/2022   CHOLHDL 3 08/12/2021   Lab Results  Component Value Date   ALT 14 10/12/2022   AST 11 10/12/2022   ALKPHOS 70 10/12/2022   BILITOT 0.3 10/12/2022   The 10-year ASCVD risk score (Arnett DK, et al., 2019) is: 13%   Values used to calculate the score:     Age: 52 years     Sex: Female     Is Non-Hispanic African American: Yes     Diabetic: Yes     Tobacco smoker: No     Systolic Blood Pressure: 131 mmHg     Is BP treated: Yes     HDL Cholesterol: 43 mg/dL     Total Cholesterol: 203 mg/dL   Vit D deficiency  She is taking Vit D 50,000 IU every 2 weeks.  Denies side effects.  Denies nausea, vomiting or muscle weakness.    Lab Results  Component Value Date   VD25OH 41.9 10/12/2022   VD25OH 60.7 03/30/2022   VD25OH 48.71 08/12/2021    Depression with emotional eating Taking Wellbutrin XL 300 mg  Denies side effects, requesting refill.  PHYSICAL EXAM:  Blood pressure 131/81, pulse 80, temperature 98.6 F (37 C), height 5\' 6"  (1.676 m), weight 240 lb (108.9 kg), last menstrual period 08/13/2014, SpO2 98%. Body mass index is 38.74 kg/m.  General: She is overweight, cooperative, alert, well developed, and in no acute distress. PSYCH: Has normal mood, affect and thought process.   Extremities: No edema.  Neurologic: No gross sensory or motor deficits. No tremors or fasciculations noted.    DIAGNOSTIC DATA  REVIEWED:  BMET    Component Value Date/Time   NA 144 10/12/2022 0801   K 4.5 10/12/2022 0801   CL 103 10/12/2022 0801   CO2 24 10/12/2022 0801   GLUCOSE 93 10/12/2022 0801   GLUCOSE 87 08/12/2021 0937   BUN 14 10/12/2022 0801   CREATININE 0.76 10/12/2022 0801   CREATININE 0.81 05/01/2013 1105   CALCIUM 9.7 10/12/2022 0801   GFRNONAA 106 08/03/2019 1356   GFRAA 123 08/03/2019 1356   Lab Results  Component Value Date   HGBA1C 6.2 (H) 10/12/2022   HGBA1C 5.8 (H) 11/05/2016   Lab Results  Component Value Date   INSULIN 19.9 03/30/2022   INSULIN 12.9 11/05/2016   Lab Results  Component Value Date   TSH 1.660 10/12/2022   CBC    Component Value Date/Time   WBC 6.6 10/12/2022 0801   WBC 7.3 08/12/2021 0937   RBC 4.41 10/12/2022 0801   RBC 4.37 08/12/2021 0937   HGB 13.3 10/12/2022 0801   HCT 41.2 10/12/2022 0801   PLT 411 10/12/2022 0801   MCV 93 10/12/2022 0801   MCH 30.2 10/12/2022 0801   MCH 27.2 08/31/2014 0440   MCHC 32.3 10/12/2022 0801   MCHC 32.7 08/12/2021 0937   RDW 12.9 10/12/2022 0801   Iron Studies No results found for: "IRON", "TIBC", "FERRITIN", "IRONPCTSAT" Lipid Panel     Component Value Date/Time   CHOL 203 (H) 10/12/2022 0801   TRIG 150 (H) 10/12/2022 0801   HDL 43 10/12/2022 0801   CHOLHDL 3 08/12/2021 0937   VLDL 27.2 08/12/2021 0937   LDLCALC 133 (H) 10/12/2022 0801   Hepatic Function Panel     Component Value Date/Time   PROT 6.7 10/12/2022 0801   ALBUMIN 4.2 10/12/2022 0801   AST 11 10/12/2022 0801   ALT 14 10/12/2022 0801   ALKPHOS 70 10/12/2022 0801   BILITOT 0.3 10/12/2022 0801      Component Value Date/Time   TSH 1.660 10/12/2022 0801   Nutritional Lab Results  Component Value Date   VD25OH 41.9 10/12/2022   VD25OH 60.7 03/30/2022   VD25OH 48.71 08/12/2021     ASSESSMENT AND PLAN  TREATMENT PLAN FOR OBESITY:  Recommended Dietary Goals  [name] is currently in the action stage of change. As such, her goal is  to continue weight management plan. She has agreed to keeping a food journal and adhering to recommended goals of 1500-1600 calories and 90+ protein.  Behavioral Intervention  We discussed the following Behavioral Modification Strategies today: increasing lean protein intake to established goals, increasing water intake , work on tracking and journaling calories using tracking application, reading food labels , keeping healthy foods at home, and continue to work  on maintaining a reduced calorie state, getting the recommended amount of protein, incorporating whole foods, making healthy choices, staying well hydrated and practicing mindfulness when eating..  Additional resources provided today: NA  Recommended Physical Activity Goals  [name] has been advised to work up to 150 minutes of moderate intensity aerobic activity a week and strengthening exercises 2-3 times per week for cardiovascular health, weight loss maintenance and preservation of muscle mass.   She has agreed to Think about enjoyable ways to increase daily physical activity and overcoming barriers to exercise, Increase physical activity in their day and reduce sedentary time (increase NEAT)., and Increase the intensity, frequency or duration of aerobic exercises      ASSOCIATED CONDITIONS ADDRESSED TODAY  Action/Plan  Type 2 diabetes mellitus with other specified complication, without long-term current use of insulin (HCC) -     Increase Tirzepatide; Inject 7.5 mg into the skin once a week.  Dispense: 2 mL; Refill: 0. Side effects discussesd.   Hyperlipidemia associated with type 2 diabetes mellitus (HCC) -     Restart Rosuvastatin Calcium; Take 1 tablet (10 mg total) by mouth at bedtime.  Dispense: 30 tablet; Refill: 0. Side effects discussed.  Refilled until she can follow up with PCP.  Take as directed   Vitamin D deficiency -     Vitamin D (Ergocalciferol); Take 1 capsule (50,000 Units total) by mouth every 14 (fourteen)  days.  Dispense: 6 capsule; Refill: 0  Other Depression with emotional eating -     buPROPion HCl ER (XL); Take 1 tablet (300 mg total) by mouth daily.  Dispense: 30 tablet; Refill: 0  Drug-induced constipation -     linaCLOtide; Take 1 capsule (72 mcg total) by mouth daily before breakfast.  Dispense: 30 capsule; Refill: 0  Morbid obesity (HCC)  BMI 38.0-38.9,adult       Labs reviewed in chart with patient from 10/12/22  Return in about 4 weeks (around 12/23/2022).Marland Kitchen She was informed of the importance of frequent follow up visits to maximize her success with intensive lifestyle modifications for her multiple health conditions.   ATTESTASTION STATEMENTS:  Reviewed by clinician on day of visit: allergies, medications, problem list, medical history, surgical history, family history, social history, and previous encounter notes.    [name]. Angelis Gates FNP-C

## 2022-12-22 ENCOUNTER — Encounter: Payer: Self-pay | Admitting: Nurse Practitioner

## 2022-12-22 ENCOUNTER — Ambulatory Visit: Payer: BC Managed Care – PPO | Admitting: Nurse Practitioner

## 2022-12-22 ENCOUNTER — Other Ambulatory Visit (HOSPITAL_BASED_OUTPATIENT_CLINIC_OR_DEPARTMENT_OTHER): Payer: Self-pay

## 2022-12-22 VITALS — BP 139/89 | HR 88 | Temp 98.6°F | Ht 66.0 in | Wt 237.0 lb

## 2022-12-22 DIAGNOSIS — E559 Vitamin D deficiency, unspecified: Secondary | ICD-10-CM

## 2022-12-22 DIAGNOSIS — F3289 Other specified depressive episodes: Secondary | ICD-10-CM

## 2022-12-22 DIAGNOSIS — K5903 Drug induced constipation: Secondary | ICD-10-CM

## 2022-12-22 DIAGNOSIS — Z7985 Long-term (current) use of injectable non-insulin antidiabetic drugs: Secondary | ICD-10-CM

## 2022-12-22 DIAGNOSIS — E1169 Type 2 diabetes mellitus with other specified complication: Secondary | ICD-10-CM | POA: Diagnosis not present

## 2022-12-22 DIAGNOSIS — Z6838 Body mass index (BMI) 38.0-38.9, adult: Secondary | ICD-10-CM

## 2022-12-22 DIAGNOSIS — E785 Hyperlipidemia, unspecified: Secondary | ICD-10-CM | POA: Diagnosis not present

## 2022-12-22 MED ORDER — TIRZEPATIDE 7.5 MG/0.5ML ~~LOC~~ SOAJ
7.5000 mg | SUBCUTANEOUS | 0 refills | Status: DC
  Filled 2022-12-22: qty 2, 28d supply, fill #0

## 2022-12-22 MED ORDER — ROSUVASTATIN CALCIUM 10 MG PO TABS
10.0000 mg | ORAL_TABLET | Freq: Every day | ORAL | 0 refills | Status: DC
  Filled 2022-12-22: qty 30, 30d supply, fill #0

## 2022-12-22 MED ORDER — VITAMIN D (ERGOCALCIFEROL) 1.25 MG (50000 UNIT) PO CAPS
50000.0000 [IU] | ORAL_CAPSULE | ORAL | 0 refills | Status: DC
  Filled 2022-12-22: qty 6, 84d supply, fill #0

## 2022-12-22 MED ORDER — BUPROPION HCL ER (XL) 300 MG PO TB24
300.0000 mg | ORAL_TABLET | Freq: Every day | ORAL | 0 refills | Status: DC
  Filled 2022-12-22: qty 30, 30d supply, fill #0

## 2022-12-22 NOTE — Progress Notes (Signed)
Office: [phone]  /  Fax: [phone]  WEIGHT SUMMARY AND BIOMETRICS  Weight Lost Since Last Visit: 3 lb  Weight Gained Since Last Visit: 0   Vitals Temp: 98.6 F (37 C) BP: 139/89 Pulse Rate: 88 SpO2: 100 %   Anthropometric Measurements Height: 5\' 6"  (1.676 m) Weight: 237 lb (107.5 kg) BMI (Calculated): 38.27 Weight at Last Visit: 240 Weight Lost Since Last Visit: 3 lb Weight Gained Since Last Visit: 0 Starting Weight: 240 LB Total Weight Loss (lbs): 3 lb (1.361 kg)   Body Composition  Body Fat %: 46.7 % Fat Mass (lbs): 110.8 lbs Muscle Mass (lbs): 120 lbs Total Body Water (lbs): 85.6 lbs Visceral Fat Rating : 13   Other Clinical Data Fasting: NO Labs: NO Today's Visit #: 35 Starting Date: 08/03/19     HPI  Chief Complaint: OBESITY  [name] is here to discuss her progress with her obesity treatment plan. She is on the keeping a food journal and adhering to recommended goals of 1600 calories and 90+ protein and states she is following her eating plan approximately 75 % of the time. She states she is exercising 0 minutes 0 days per week.   Interval History:  Since last office visit she has lost 3 pounds.  She is averaging around 1600 calories and is unsure of protein intake.  She is drinking water and a zero sugar drink daily.  Denies polyphagia or cravings.   Pharmacotherapy for weight loss: She is not currently taking medications  for medical weight loss.     Previous pharmacotherapy for medical weight loss:  None   Bariatric surgery:  Patient has not had bariatric surgery   Pharmacotherapy for DMT2:  She is currently taking Mounjaro 7.5 mg.  Reports some side effects of constipation and is taking Linzess 72 mcg.   Denies side effects.   She doesn't want to continue taking Linzess at this time.  She would like to see how she does without it.  Last A1c was 6.2 She is not checking BS at home.   Episodes of hypoglycemia: no On a statin.  Last eye  exam:  12/23-Scheduled on 01/18/23.  She has tried Metformi, Rybelsus and Ozempic in the past.   She stopped Metformin and Ozempic due to side effects   Lab Results  Component Value Date   HGBA1C 6.2 (H) 10/12/2022   HGBA1C 6.0 (H) 03/30/2022   HGBA1C 6.0 08/12/2021   Lab Results  Component Value Date   MICROALBUR <0.7 06/11/2020   LDLCALC 133 (H) 10/12/2022   CREATININE 0.76 10/12/2022     Hyperlipidemia Medication(s): Crestor 10mg . Denies side effects.    Lab Results  Component Value Date   CHOL 203 (H) 10/12/2022   HDL 43 10/12/2022   LDLCALC 133 (H) 10/12/2022   TRIG 150 (H) 10/12/2022   CHOLHDL 3 08/12/2021   Lab Results  Component Value Date   ALT 14 10/12/2022   AST 11 10/12/2022   ALKPHOS 70 10/12/2022   BILITOT 0.3 10/12/2022   The 10-year ASCVD risk score (Arnett DK, et al., 2019) is: 16.2%   Values used to calculate the score:     Age: 52 years     Sex: Female     Is Non-Hispanic African American: Yes     Diabetic: Yes     Tobacco smoker: No     Systolic Blood Pressure: 139 mmHg     Is BP treated: Yes     HDL Cholesterol: 43 mg/dL  Total Cholesterol: 203 mg/dL   Vit D deficiency  She is taking Vit D 50,000 IU every 2 weeks.  Denies side effects.  Denies nausea, vomiting or muscle weakness.    Lab Results  Component Value Date   VD25OH 41.9 10/12/2022   VD25OH 60.7 03/30/2022   VD25OH 48.71 08/12/2021    Depression with emotional eating Taking Wellbutrin XL 300 mg  Denies side effects  PHYSICAL EXAM:  Blood pressure 139/89, pulse 88, temperature 98.6 F (37 C), height 5\' 6"  (1.676 m), weight 237 lb (107.5 kg), last menstrual period 08/13/2014, SpO2 100%. Body mass index is 38.25 kg/m.  General: She is overweight, cooperative, alert, well developed, and in no acute distress. PSYCH: Has normal mood, affect and thought process.   Extremities: No edema.  Neurologic: No gross sensory or motor deficits. No tremors or fasciculations noted.     DIAGNOSTIC DATA REVIEWED:  BMET    Component Value Date/Time   NA 144 10/12/2022 0801   K 4.5 10/12/2022 0801   CL 103 10/12/2022 0801   CO2 24 10/12/2022 0801   GLUCOSE 93 10/12/2022 0801   GLUCOSE 87 08/12/2021 0937   BUN 14 10/12/2022 0801   CREATININE 0.76 10/12/2022 0801   CREATININE 0.81 05/01/2013 1105   CALCIUM 9.7 10/12/2022 0801   GFRNONAA 106 08/03/2019 1356   GFRAA 123 08/03/2019 1356   Lab Results  Component Value Date   HGBA1C 6.2 (H) 10/12/2022   HGBA1C 5.8 (H) 11/05/2016   Lab Results  Component Value Date   INSULIN 19.9 03/30/2022   INSULIN 12.9 11/05/2016   Lab Results  Component Value Date   TSH 1.660 10/12/2022   CBC    Component Value Date/Time   WBC 6.6 10/12/2022 0801   WBC 7.3 08/12/2021 0937   RBC 4.41 10/12/2022 0801   RBC 4.37 08/12/2021 0937   HGB 13.3 10/12/2022 0801   HCT 41.2 10/12/2022 0801   PLT 411 10/12/2022 0801   MCV 93 10/12/2022 0801   MCH 30.2 10/12/2022 0801   MCH 27.2 08/31/2014 0440   MCHC 32.3 10/12/2022 0801   MCHC 32.7 08/12/2021 0937   RDW 12.9 10/12/2022 0801   Iron Studies No results found for: "IRON", "TIBC", "FERRITIN", "IRONPCTSAT" Lipid Panel     Component Value Date/Time   CHOL 203 (H) 10/12/2022 0801   TRIG 150 (H) 10/12/2022 0801   HDL 43 10/12/2022 0801   CHOLHDL 3 08/12/2021 0937   VLDL 27.2 08/12/2021 0937   LDLCALC 133 (H) 10/12/2022 0801   Hepatic Function Panel     Component Value Date/Time   PROT 6.7 10/12/2022 0801   ALBUMIN 4.2 10/12/2022 0801   AST 11 10/12/2022 0801   ALT 14 10/12/2022 0801   ALKPHOS 70 10/12/2022 0801   BILITOT 0.3 10/12/2022 0801      Component Value Date/Time   TSH 1.660 10/12/2022 0801   Nutritional Lab Results  Component Value Date   VD25OH 41.9 10/12/2022   VD25OH 60.7 03/30/2022   VD25OH 48.71 08/12/2021     ASSESSMENT AND PLAN  TREATMENT PLAN FOR OBESITY:  Recommended Dietary Goals  Nadeya is currently in the action stage of change.  As such, her goal is to continue weight management plan. She has agreed to keeping a food journal and adhering to recommended goals of 1600 calories and 90 protein.  Behavioral Intervention  We discussed the following Behavioral Modification Strategies today: increasing lean protein intake to established goals, increasing water intake , reading food labels ,  keeping healthy foods at home, planning for success, celebration eating strategies, and continue to work on maintaining a reduced calorie state, getting the recommended amount of protein, incorporating whole foods, making healthy choices, staying well hydrated and practicing mindfulness when eating..  Additional resources provided today: NA  Recommended Physical Activity Goals  Mahliyah has been advised to work up to 150 minutes of moderate intensity aerobic activity a week and strengthening exercises 2-3 times per week for cardiovascular health, weight loss maintenance and preservation of muscle mass.   She has agreed to Think about enjoyable ways to increase daily physical activity and overcoming barriers to exercise, Increase physical activity in their day and reduce sedentary time (increase NEAT)., and Work on scheduling and tracking physical activity.    ASSOCIATED CONDITIONS ADDRESSED TODAY  Action/Plan  Type 2 diabetes mellitus with other specified complication, without long-term current use of insulin (HCC) -     Continue Tirzepatide; Inject 7.5 mg into the skin once a week.  Dispense: 2 mL; Refill: 0. Side effects discussed.   Vitamin D deficiency -     Vitamin D (Ergocalciferol); Take 1 capsule (50,000 Units total) by mouth every 14 (fourteen) days.  Dispense: 6 capsule; Refill: 0.  Side effects discussed  Hyperlipidemia associated with type 2 diabetes mellitus (HCC) -     Continue Rosuvastatin Calcium; Take 1 tablet (10 mg total) by mouth at bedtime.  Dispense: 30 tablet; Refill: 0  Other Depression with emotional eating -      Continue buPROPion HCl ER (XL); Take 1 tablet (300 mg total) by mouth daily.  Dispense: 30 tablet; Refill: 0  Drug-induced constipation Stop Limzess.  Will continue to monitor.    Morbid obesity (HCC)  BMI 38.0-38.9,adult     Plans to make appt with PCP for follow up and labs in Dec.   Return in about 4 weeks (around 01/19/2023).Marland Kitchen She was informed of the importance of frequent follow up visits to maximize her success with intensive lifestyle modifications for her multiple health conditions.   ATTESTASTION STATEMENTS:  Reviewed by clinician on day of visit: allergies, medications, problem list, medical history, surgical history, family history, social history, and previous encounter notes.     Theodis Sato. Shanee Batch FNP-C

## 2023-01-12 ENCOUNTER — Ambulatory Visit: Payer: BC Managed Care – PPO | Admitting: Nurse Practitioner

## 2023-01-28 ENCOUNTER — Telehealth (INDEPENDENT_AMBULATORY_CARE_PROVIDER_SITE_OTHER): Payer: Self-pay | Admitting: Nurse Practitioner

## 2023-01-28 ENCOUNTER — Other Ambulatory Visit: Payer: Self-pay | Admitting: Nurse Practitioner

## 2023-01-28 ENCOUNTER — Other Ambulatory Visit (HOSPITAL_BASED_OUTPATIENT_CLINIC_OR_DEPARTMENT_OTHER): Payer: Self-pay

## 2023-01-28 DIAGNOSIS — E1169 Type 2 diabetes mellitus with other specified complication: Secondary | ICD-10-CM

## 2023-01-28 DIAGNOSIS — F3289 Other specified depressive episodes: Secondary | ICD-10-CM

## 2023-01-28 DIAGNOSIS — E559 Vitamin D deficiency, unspecified: Secondary | ICD-10-CM

## 2023-01-28 MED ORDER — BUPROPION HCL ER (XL) 300 MG PO TB24
300.0000 mg | ORAL_TABLET | Freq: Every day | ORAL | 0 refills | Status: DC
  Filled 2023-01-28: qty 30, 30d supply, fill #0

## 2023-01-28 MED ORDER — ROSUVASTATIN CALCIUM 10 MG PO TABS
10.0000 mg | ORAL_TABLET | Freq: Every day | ORAL | 0 refills | Status: DC
  Filled 2023-01-28: qty 30, 30d supply, fill #0

## 2023-01-28 MED ORDER — TIRZEPATIDE 7.5 MG/0.5ML ~~LOC~~ SOAJ
7.5000 mg | SUBCUTANEOUS | 0 refills | Status: DC
  Filled 2023-01-28: qty 2, 28d supply, fill #0

## 2023-01-28 MED ORDER — VITAMIN D (ERGOCALCIFEROL) 1.25 MG (50000 UNIT) PO CAPS
50000.0000 [IU] | ORAL_CAPSULE | ORAL | 0 refills | Status: DC
  Filled 2023-01-28: qty 2, 28d supply, fill #0

## 2023-01-28 NOTE — Telephone Encounter (Signed)
 Patient called to reschedule her missed December appointment. She needs the first appointment of the morning, therefore the first available appointment is 03/02/23. Patient states her prescriptions will run out before then. Patient states she needs refills for her Mounjaro , Wellbutrin , Vitamin D  and Rovustatin. [facility] send to [facility].

## 2023-03-02 ENCOUNTER — Encounter: Payer: Self-pay | Admitting: Nurse Practitioner

## 2023-03-02 ENCOUNTER — Other Ambulatory Visit (HOSPITAL_BASED_OUTPATIENT_CLINIC_OR_DEPARTMENT_OTHER): Payer: Self-pay

## 2023-03-02 ENCOUNTER — Ambulatory Visit: Payer: 59 | Admitting: Nurse Practitioner

## 2023-03-02 VITALS — BP 133/88 | HR 82 | Temp 98.5°F | Ht 66.0 in | Wt 239.0 lb

## 2023-03-02 DIAGNOSIS — K5903 Drug induced constipation: Secondary | ICD-10-CM

## 2023-03-02 DIAGNOSIS — E1169 Type 2 diabetes mellitus with other specified complication: Secondary | ICD-10-CM | POA: Diagnosis not present

## 2023-03-02 DIAGNOSIS — E559 Vitamin D deficiency, unspecified: Secondary | ICD-10-CM

## 2023-03-02 DIAGNOSIS — E785 Hyperlipidemia, unspecified: Secondary | ICD-10-CM | POA: Diagnosis not present

## 2023-03-02 DIAGNOSIS — Z6838 Body mass index (BMI) 38.0-38.9, adult: Secondary | ICD-10-CM

## 2023-03-02 DIAGNOSIS — F5089 Other specified eating disorder: Secondary | ICD-10-CM

## 2023-03-02 DIAGNOSIS — F3289 Other specified depressive episodes: Secondary | ICD-10-CM | POA: Diagnosis not present

## 2023-03-02 DIAGNOSIS — Z7985 Long-term (current) use of injectable non-insulin antidiabetic drugs: Secondary | ICD-10-CM

## 2023-03-02 MED ORDER — BUPROPION HCL ER (XL) 300 MG PO TB24
300.0000 mg | ORAL_TABLET | Freq: Every day | ORAL | 0 refills | Status: DC
  Filled 2023-03-02: qty 30, 30d supply, fill #0

## 2023-03-02 MED ORDER — LINACLOTIDE 72 MCG PO CAPS
72.0000 ug | ORAL_CAPSULE | Freq: Every day | ORAL | 0 refills | Status: AC
  Filled 2023-03-02: qty 30, 30d supply, fill #0

## 2023-03-02 MED ORDER — VITAMIN D (ERGOCALCIFEROL) 1.25 MG (50000 UNIT) PO CAPS
50000.0000 [IU] | ORAL_CAPSULE | ORAL | 0 refills | Status: DC
  Filled 2023-03-02: qty 2, 28d supply, fill #0

## 2023-03-02 MED ORDER — ROSUVASTATIN CALCIUM 10 MG PO TABS
10.0000 mg | ORAL_TABLET | Freq: Every day | ORAL | 0 refills | Status: DC
  Filled 2023-03-02: qty 30, 30d supply, fill #0

## 2023-03-02 MED ORDER — TIRZEPATIDE 10 MG/0.5ML ~~LOC~~ SOAJ
10.0000 mg | SUBCUTANEOUS | 0 refills | Status: DC
  Filled 2023-03-02: qty 2, 28d supply, fill #0

## 2023-03-02 NOTE — Progress Notes (Signed)
 Office: [phone]  /  Fax: [phone]  WEIGHT SUMMARY AND BIOMETRICS  Weight Lost Since Last Visit: 0lb  Weight Gained Since Last Visit: 2lb   Vitals Temp: 98.5 F (36.9 C) BP: 133/88 Pulse Rate: 82 SpO2: 95 %   Anthropometric Measurements Height: 5' 6 (1.676 m) Weight: 239 lb (108.4 kg) BMI (Calculated): 38.59 Weight at Last Visit: 237lb Weight Lost Since Last Visit: 0lb Weight Gained Since Last Visit: 2lb Starting Weight: 240lb Total Weight Loss (lbs): 1 lb (0.454 kg)   Body Composition  Body Fat %: 46.4 % Fat Mass (lbs): 111 lbs Muscle Mass (lbs): 122 lbs Total Body Water  (lbs): 83.8 lbs Visceral Fat Rating : 13   Other Clinical Data Fasting: No Labs: No Today's Visit #: 36 Starting Date: 08/03/19     HPI  Chief Complaint: OBESITY  [name] is here to discuss her progress with her obesity treatment plan. She is on the keeping a food journal and adhering to recommended goals of 1600 calories and 90+ protein and states she is following her eating plan approximately 50 % of the time. She states she is exercising 10 minutes 3 days per week.   Interval History:  Since last office visit she has gained 2 pounds.  Finds it's hard to focus on herself due to caring for her parents. She is drinking water  and zero sugar lemonade daily.     Pharmacotherapy for weight loss: She is not currently taking medications  for medical weight loss.     Previous pharmacotherapy for medical weight loss:  None   Bariatric surgery:  Patient has not had bariatric surgery   Pharmacotherapy for DMT2:  She is currently taking Mounjaro  7.5 mg.  Reports some side effects of constipation. She stopped linzess  after her last visit and is requesting to restart taking it again.  Last A1c was 6.2 She is not checking BS at home.   Episodes of hypoglycemia: no On a statin.  Last eye exam:  01/18/23-has follow up appt in April  She has tried Metformin , Rybelsus  and Ozempic  in the  past.   She stopped Metformin  and Ozempic  due to side effects   Lab Results  Component Value Date   HGBA1C 6.2 (H) 10/12/2022   HGBA1C 6.0 (H) 03/30/2022   HGBA1C 6.0 08/12/2021   Lab Results  Component Value Date   MICROALBUR <0.7 06/11/2020   LDLCALC 133 (H) 10/12/2022   CREATININE 0.76 10/12/2022     Hyperlipidemia Medication(s): Crestor  10mg . Denies side effects.   Cardiovascular risk factors: dyslipidemia, obesity (BMI >= 30 kg/m2), and sedentary lifestyle  Lab Results  Component Value Date   CHOL 203 (H) 10/12/2022   HDL 43 10/12/2022   LDLCALC 133 (H) 10/12/2022   TRIG 150 (H) 10/12/2022   CHOLHDL 3 08/12/2021   Lab Results  Component Value Date   ALT 14 10/12/2022   AST 11 10/12/2022   ALKPHOS 70 10/12/2022   BILITOT 0.3 10/12/2022   The 10-year ASCVD risk score (Arnett DK, et al., 2019) is: 13.8%   Values used to calculate the score:     Age: 53 years     Sex: Female     Is Non-Hispanic African American: Yes     Diabetic: Yes     Tobacco smoker: No     Systolic Blood Pressure: 133 mmHg     Is BP treated: Yes     HDL Cholesterol: 43 mg/dL     Total Cholesterol: 203 mg/dL   Vit  D deficiency  She is taking Vit D 50,000 IU every 2 weeks.   Denies side effects.  Denies nausea, vomiting or muscle weakness.    Lab Results  Component Value Date   VD25OH 41.9 10/12/2022   VD25OH 60.7 03/30/2022   VD25OH 48.71 08/12/2021     Depression with emotional eating Taking Wellbutrin  XL 300 mg  Denies side effects. Requesting a refill.     PHYSICAL EXAM:  Blood pressure 133/88, pulse 82, temperature 98.5 F (36.9 C), height 5' 6 (1.676 m), weight 239 lb (108.4 kg), last menstrual period 08/13/2014, SpO2 95%. Body mass index is 38.58 kg/m.  General: She is overweight, cooperative, alert, well developed, and in no acute distress. PSYCH: Has normal mood, affect and thought process.   Extremities: No edema.  Neurologic: No gross sensory or motor deficits. No  tremors or fasciculations noted.    DIAGNOSTIC DATA REVIEWED:  BMET    Component Value Date/Time   NA 144 10/12/2022 0801   K 4.5 10/12/2022 0801   CL 103 10/12/2022 0801   CO2 24 10/12/2022 0801   GLUCOSE 93 10/12/2022 0801   GLUCOSE 87 08/12/2021 0937   BUN 14 10/12/2022 0801   CREATININE 0.76 10/12/2022 0801   CREATININE 0.81 05/01/2013 1105   CALCIUM  9.7 10/12/2022 0801   GFRNONAA 106 08/03/2019 1356   GFRAA 123 08/03/2019 1356   Lab Results  Component Value Date   HGBA1C 6.2 (H) 10/12/2022   HGBA1C 5.8 (H) 11/05/2016   Lab Results  Component Value Date   INSULIN  19.9 03/30/2022   INSULIN  12.9 11/05/2016   Lab Results  Component Value Date   TSH 1.660 10/12/2022   CBC    Component Value Date/Time   WBC 6.6 10/12/2022 0801   WBC 7.3 08/12/2021 0937   RBC 4.41 10/12/2022 0801   RBC 4.37 08/12/2021 0937   HGB 13.3 10/12/2022 0801   HCT 41.2 10/12/2022 0801   PLT 411 10/12/2022 0801   MCV 93 10/12/2022 0801   MCH 30.2 10/12/2022 0801   MCH 27.2 08/31/2014 0440   MCHC 32.3 10/12/2022 0801   MCHC 32.7 08/12/2021 0937   RDW 12.9 10/12/2022 0801   Iron Studies No results found for: IRON, TIBC, FERRITIN, IRONPCTSAT Lipid Panel     Component Value Date/Time   CHOL 203 (H) 10/12/2022 0801   TRIG 150 (H) 10/12/2022 0801   HDL 43 10/12/2022 0801   CHOLHDL 3 08/12/2021 0937   VLDL 27.2 08/12/2021 0937   LDLCALC 133 (H) 10/12/2022 0801   Hepatic Function Panel     Component Value Date/Time   PROT 6.7 10/12/2022 0801   ALBUMIN 4.2 10/12/2022 0801   AST 11 10/12/2022 0801   ALT 14 10/12/2022 0801   ALKPHOS 70 10/12/2022 0801   BILITOT 0.3 10/12/2022 0801      Component Value Date/Time   TSH 1.660 10/12/2022 0801   Nutritional Lab Results  Component Value Date   VD25OH 41.9 10/12/2022   VD25OH 60.7 03/30/2022   VD25OH 48.71 08/12/2021     ASSESSMENT AND PLAN  TREATMENT PLAN FOR OBESITY:  Recommended Dietary Goals  Charlese is  currently in the action stage of change. As such, her goal is to continue weight management plan. She has agreed to keeping a food journal and adhering to recommended goals of 1500 calories and 100+ grams protein.  Behavioral Intervention  We discussed the following Behavioral Modification Strategies today: increasing lean protein intake to established goals, decreasing simple carbohydrates , increasing  vegetables, increasing fiber rich foods, increasing water  intake , work on meal planning and preparation, reading food labels , planning for success, better snacking choices, and continue to work on maintaining a reduced calorie state, getting the recommended amount of protein, incorporating whole foods, making healthy choices, staying well hydrated and practicing mindfulness when eating..  Additional resources provided today: NA  Recommended Physical Activity Goals  Britiney has been advised to work up to 150 minutes of moderate intensity aerobic activity a week and strengthening exercises 2-3 times per week for cardiovascular health, weight loss maintenance and preservation of muscle mass.   She has agreed to Think about enjoyable ways to increase daily physical activity and overcoming barriers to exercise, Increase physical activity in their day and reduce sedentary time (increase NEAT)., and Work on scheduling and tracking physical activity.    ASSOCIATED CONDITIONS ADDRESSED TODAY  Action/Plan  Type 2 diabetes mellitus with other specified complication, without long-term current use of insulin  (HCC) -     Increase Tirzepatide ; Inject 10 mg into the skin once a week.  Dispense: 2 mL; Refill: 0. Side effects discussed.   Drug-induced constipation -     Restart linaCLOtide ; Take 1 capsule (72 mcg total) by mouth daily before breakfast.  Dispense: 30 capsule; Refill: 0.  Side effects discussed.   Other Depression with emotional eating -     Continue buPROPion  HCl ER (XL); Take 1 tablet (300  mg total) by mouth daily.  Dispense: 30 tablet; Refill: 0  Hyperlipidemia associated with type 2 diabetes mellitus (HCC) -     Continue Rosuvastatin  Calcium ; Take 1 tablet (10 mg total) by mouth at bedtime.  Dispense: 30 tablet; Refill: 0.  Side effects discussed.  Vitamin D  deficiency -     Continue Vitamin D  (Ergocalciferol ); Take 1 capsule (50,000 Units total) by mouth every 14 (fourteen) days.  Dispense: 2 capsule; Refill: 0. Side effects discussed.   Morbid obesity (HCC)  BMI 38.0-38.9,adult  Patient is really struggling with focusing on herself and her health due to stressors at home.  She is considering taking a break from the program.  Will discuss again at her next visit.  I have encouraged her to reach out to her family and discuss her concerns and stressors.  See if they will help with caring for their parents.  I've also asked her to follow up with her PCP.     Will obtain fasting labs at next visit.   Return in about 4 weeks (around 03/30/2023).SABRA She was informed of the importance of frequent follow up visits to maximize her success with intensive lifestyle modifications for her multiple health conditions.   ATTESTASTION STATEMENTS:  Reviewed by clinician on day of visit: allergies, medications, problem list, medical history, surgical history, family history, social history, and previous encounter notes.     Corean SAUNDERS. Tashari Schoenfelder FNP-C

## 2023-03-16 ENCOUNTER — Encounter: Payer: 59 | Admitting: Family Medicine

## 2023-03-22 ENCOUNTER — Other Ambulatory Visit (HOSPITAL_BASED_OUTPATIENT_CLINIC_OR_DEPARTMENT_OTHER): Payer: Self-pay

## 2023-03-26 ENCOUNTER — Encounter: Payer: 59 | Admitting: Family Medicine

## 2023-03-30 ENCOUNTER — Ambulatory Visit: Payer: 59 | Admitting: Nurse Practitioner

## 2023-04-06 ENCOUNTER — Ambulatory Visit (INDEPENDENT_AMBULATORY_CARE_PROVIDER_SITE_OTHER): Payer: 59 | Admitting: Family Medicine

## 2023-04-06 ENCOUNTER — Telehealth: Payer: Self-pay | Admitting: Neurology

## 2023-04-06 ENCOUNTER — Other Ambulatory Visit (HOSPITAL_COMMUNITY)
Admission: RE | Admit: 2023-04-06 | Discharge: 2023-04-06 | Disposition: A | Discharge status: Home / Self Care | Source: Ambulatory Visit | Attending: Family Medicine | Admitting: Family Medicine

## 2023-04-06 ENCOUNTER — Other Ambulatory Visit (HOSPITAL_BASED_OUTPATIENT_CLINIC_OR_DEPARTMENT_OTHER): Payer: Self-pay

## 2023-04-06 ENCOUNTER — Encounter: Payer: Self-pay | Admitting: Family Medicine

## 2023-04-06 VITALS — BP 132/90 | HR 89 | Temp 98.9°F | Resp 18 | Ht 66.0 in | Wt 245.6 lb

## 2023-04-06 DIAGNOSIS — Z7985 Long-term (current) use of injectable non-insulin antidiabetic drugs: Secondary | ICD-10-CM

## 2023-04-06 DIAGNOSIS — E785 Hyperlipidemia, unspecified: Secondary | ICD-10-CM

## 2023-04-06 DIAGNOSIS — Z0000 Encounter for general adult medical examination without abnormal findings: Secondary | ICD-10-CM | POA: Diagnosis not present

## 2023-04-06 DIAGNOSIS — F3289 Other specified depressive episodes: Secondary | ICD-10-CM | POA: Diagnosis not present

## 2023-04-06 DIAGNOSIS — Z7251 High risk heterosexual behavior: Secondary | ICD-10-CM | POA: Insufficient documentation

## 2023-04-06 DIAGNOSIS — E1169 Type 2 diabetes mellitus with other specified complication: Secondary | ICD-10-CM

## 2023-04-06 DIAGNOSIS — I10 Essential (primary) hypertension: Secondary | ICD-10-CM

## 2023-04-06 DIAGNOSIS — F418 Other specified anxiety disorders: Secondary | ICD-10-CM

## 2023-04-06 LAB — CBC WITH DIFFERENTIAL/PLATELET
Basophils Absolute: 0.1 10*3/uL (ref 0.0–0.1)
Basophils Relative: 0.9 % (ref 0.0–3.0)
Eosinophils Absolute: 0.1 10*3/uL (ref 0.0–0.7)
Eosinophils Relative: 1.5 % (ref 0.0–5.0)
HCT: 41.8 % (ref 36.0–46.0)
Hemoglobin: 13.5 g/dL (ref 12.0–15.0)
Lymphocytes Relative: 42.9 % (ref 12.0–46.0)
Lymphs Abs: 3.1 10*3/uL (ref 0.7–4.0)
MCHC: 32.4 g/dL (ref 30.0–36.0)
MCV: 94.6 fl (ref 78.0–100.0)
Monocytes Absolute: 0.4 10*3/uL (ref 0.1–1.0)
Monocytes Relative: 5.9 % (ref 3.0–12.0)
Neutro Abs: 3.5 10*3/uL (ref 1.4–7.7)
Neutrophils Relative %: 48.8 % (ref 43.0–77.0)
Platelets: 421 10*3/uL — ABNORMAL HIGH (ref 150.0–400.0)
RBC: 4.42 Mil/uL (ref 3.87–5.11)
RDW: 14.4 % (ref 11.5–15.5)
WBC: 7.3 10*3/uL (ref 4.0–10.5)

## 2023-04-06 LAB — COMPREHENSIVE METABOLIC PANEL
ALT: 12 U/L (ref 0–35)
AST: 12 U/L (ref 0–37)
Albumin: 4.4 g/dL (ref 3.5–5.2)
Alkaline Phosphatase: 67 U/L (ref 39–117)
BUN: 14 mg/dL (ref 6–23)
CO2: 30 meq/L (ref 19–32)
Calcium: 9.3 mg/dL (ref 8.4–10.5)
Chloride: 105 meq/L (ref 96–112)
Creatinine, Ser: 0.73 mg/dL (ref 0.40–1.20)
GFR: 94.77 mL/min (ref >60.00)
Glucose, Bld: 92 mg/dL (ref 70–99)
Potassium: 4.1 meq/L (ref 3.5–5.1)
Sodium: 141 meq/L (ref 135–145)
Total Bilirubin: 0.4 mg/dL (ref 0.2–1.2)
Total Protein: 6.9 g/dL (ref 6.0–8.3)

## 2023-04-06 LAB — LIPID PANEL
Cholesterol: 195 mg/dL (ref 0–200)
HDL: 36.2 mg/dL — ABNORMAL LOW (ref >39.00)
LDL Cholesterol: 120 mg/dL — ABNORMAL HIGH (ref 0–99)
NonHDL: 158.33
Total CHOL/HDL Ratio: 5
Triglycerides: 191 mg/dL — ABNORMAL HIGH (ref 0.0–149.0)
VLDL: 38.2 mg/dL (ref 0.0–40.0)

## 2023-04-06 LAB — VITAMIN D 25 HYDROXY (VIT D DEFICIENCY, FRACTURES): VITD: 36.72 ng/mL (ref 30.00–100.00)

## 2023-04-06 LAB — MICROALBUMIN / CREATININE URINE RATIO
Creatinine,U: 259.3 mg/dL
Microalb Creat Ratio: 6.3 mg/g (ref 0.0–30.0)
Microalb, Ur: 1.6 mg/dL (ref 0.0–1.9)

## 2023-04-06 LAB — VITAMIN B12: Vitamin B-12: 149 pg/mL — ABNORMAL LOW (ref 211–911)

## 2023-04-06 LAB — HEMOGLOBIN A1C: Hgb A1c MFr Bld: 6.2 % (ref 4.6–6.5)

## 2023-04-06 MED ORDER — TIRZEPATIDE 12.5 MG/0.5ML ~~LOC~~ SOAJ
12.5000 mg | SUBCUTANEOUS | 1 refills | Status: AC
Start: 2023-04-06 — End: ?
  Filled 2023-04-06: qty 6, 84d supply, fill #0
  Filled 2023-07-02: qty 6, 84d supply, fill #1

## 2023-04-06 MED ORDER — BUPROPION HCL ER (XL) 300 MG PO TB24
300.0000 mg | ORAL_TABLET | Freq: Every day | ORAL | 3 refills | Status: AC
  Filled 2023-04-06: qty 90, 90d supply, fill #0
  Filled 2023-10-19: qty 90, 90d supply, fill #1
  Filled 2024-02-04: qty 90, 90d supply, fill #2

## 2023-04-06 MED ORDER — NEBIVOLOL HCL 10 MG PO TABS
10.0000 mg | ORAL_TABLET | Freq: Every day | ORAL | 1 refills | Status: DC
  Filled 2023-04-06: qty 90, 90d supply, fill #0

## 2023-04-06 MED ORDER — AMLODIPINE BESYLATE 10 MG PO TABS
10.0000 mg | ORAL_TABLET | Freq: Every day | ORAL | 1 refills | Status: DC
  Filled 2023-04-06: qty 90, 90d supply, fill #0

## 2023-04-06 MED ORDER — POTASSIUM CHLORIDE CRYS ER 10 MEQ PO TBCR
10.0000 meq | EXTENDED_RELEASE_TABLET | Freq: Every day | ORAL | 1 refills | Status: DC
Start: 2023-04-06 — End: 2023-07-02
  Filled 2023-04-06: qty 90, 90d supply, fill #0

## 2023-04-06 MED ORDER — ESCITALOPRAM OXALATE 20 MG PO TABS
20.0000 mg | ORAL_TABLET | Freq: Every day | ORAL | 3 refills | Status: AC
  Filled 2023-04-06: qty 90, 90d supply, fill #0
  Filled 2023-10-19: qty 90, 90d supply, fill #1
  Filled 2024-02-04: qty 90, 90d supply, fill #2

## 2023-04-06 NOTE — Progress Notes (Signed)
 Established Patient Office Visit  Subjective   Patient ID: [name], female    DOB: 06/29/1970  Age: 53 y.o. MRN: [id number]  Chief Complaint  Patient presents with   Annual Exam    Pt states fasting     HPI Discussed the use of AI scribe software for clinical note transcription with the patient, who gave verbal consent to proceed.  History of Present Illness   The patient presents with concerns about potential sexually transmitted infections and medication management.  She is concerned about potential exposure to sexually transmitted infections (STIs) due to suspicions of her boyfriend's infidelity. She has no current symptoms but is worried due to a past experience with herpes from her ex-husband. She has previously had chlamydia and is interested in testing for chlamydia and gonorrhea. She declines testing for HIV at this time. No urinary symptoms such as burning or frequency are present.  She is currently managing her weight with Mounjaro and is considering adjusting the dose. She is inconsistent with her eating habits due to stress and caregiving responsibilities. She has noticed some weight loss as her clothes are fitting differently.  She is managing multiple medications including amlodipine, Lexapro, Bystolic, potassium, and Bydureon. She mentions a change in her vitamin D supplementation to every other week based on recent lab results.  Her father, who has dementia and Parkinson's disease, recently had back surgery and is living with her. She is actively involved in his care, which is impacting her daily routine and stress levels. Her father is very attached to her, making it difficult for her to manage her own time.      Patient Active Problem List   Diagnosis Date Noted   Generalized obesity 03/02/2022   Migraine without status migrainosus, not intractable 11/11/2021   Morbid obesity (HCC) 08/12/2021   Keloid 08/12/2021   B12 deficiency 07/24/2021   Other fatigue  07/24/2021   Diabetes mellitus (HCC) 03/18/2021   Pre-operative clearance 07/12/2020   Depression with anxiety 07/12/2020   Primary osteoarthritis involving multiple joints 07/12/2020   Preventative health care 06/11/2020   Degenerative arthritis of knee, bilateral 07/27/2017   Prediabetes 12/02/2016   Class 2 severe obesity with serious comorbidity and body mass index (BMI) of 37.0 to 37.9 in adult [facility]) 12/02/2016   Depression 08/16/2016   Generalized anxiety disorder 08/16/2016   Hypokalemia 08/15/2015   S/P hysterectomy 08/30/2014   Pelvic pain in female 05/01/2013   Insomnia 12/07/2012   Situational anxiety 10/17/2012   Umbilical hernia 08/23/2012   Vitamin D deficiency 08/12/2012   History of cyst of breast 08/12/2012   Anemia 07/28/2012   Anxiety 07/28/2012   Essential hypertension 07/28/2012   Menorrhagia 07/28/2012   S/P endometrial ablation 07/28/2012   Family history of breast cancer in first degree relative 07/28/2012   Past Medical History:  Diagnosis Date   Anemia    Anxiety    Arthritis    Depression    Diabetes mellitus without complication (HCC)    Fatigue    Fibroids    uterine   Hyperlipidemia    but meds   Hypertension    Insulin resistance    Joint pain    Lower extremity edema    Menorrhagia    Pre-diabetes    Umbilical hernia    Vitamin D deficiency    Past Surgical History:  Procedure Laterality Date   CESAREAN SECTION     CRYOTHERAPY  01/27/1987   HYSTEROSCOPY WITH D & C  06/20/2008   NOVASURE ABLATION  06/20/2008   REPLACEMENT TOTAL KNEE BILATERAL     ROBOTIC ASSISTED TOTAL HYSTERECTOMY N/A 08/30/2014   Procedure: ROBOTIC ASSISTED ATTEMPTED, THEN CONVERSION TO OPEN TOTAL ABDOMINAL HYSTERECTOMY WITH BILATERAL SALPINGECTOMY;  Surgeon: Maxie Better, MD;  Location: [facility];  Service: Gynecology;  Laterality: N/A;   TMJ ARTHROPLASTY  01/27/1996   has screws in both side of jaw per pt   tubal ligation  06/20/2008   UMBILICAL HERNIA  REPAIR N/A 08/30/2014   Procedure: HERNIA REPAIR UMBILICAL ADULT;  Surgeon: Chevis Pretty III, MD;  Location: [facility];  Service: General;  Laterality: N/A;   Social History   Tobacco Use   Smoking status: Never    Passive exposure: Never   Smokeless tobacco: Never  Vaping Use   Vaping status: Never Used  Substance Use Topics   Alcohol use: No   Drug use: No   Social History   Socioeconomic History   Marital status: Divorced    Spouse name: Not on file   Number of children: 2   Years of education: Not on file   Highest education level: Not on file  Occupational History   Occupation: Magazine features editor: [employer]  Tobacco Use   Smoking status: Never    Passive exposure: Never   Smokeless tobacco: Never  Vaping Use   Vaping status: Never Used  Substance and Sexual Activity   Alcohol use: No   Drug use: No   Sexual activity: Yes    Birth control/protection: Surgical  Other Topics Concern   Not on file  Social History Narrative   Not on file   Social Drivers of Health   Financial Resource Strain: Not on file  Food Insecurity: Not on file  Transportation Needs: Not on file  Physical Activity: Not on file  Stress: Not on file  Social Connections: Unknown (06/06/2021)   [facility], [facility]   Social Network    Social Network: Not on file  Intimate Partner Violence: Unknown (04/28/2021)   Received from [employer], [facility]   HITS    Physically Hurt: Not on file    Insult or Talk Down To: Not on file    Threaten Physical Harm: Not on file    Scream or Curse: Not on file   Family Status  Relation Name Status   Mother  Alive   Father  Alive   Mat Aunt  (Not Specified)   Neg Hx  (Not Specified)  No partnership data on file   Family History  Problem Relation Age of Onset   Colon polyps Mother    Breast cancer Mother    Hypertension Mother    Diabetes Mother    Cancer Mother        breast   Hyperlipidemia Mother     Obesity Mother    Thyroid disease Mother    Hypertension Father    Hyperlipidemia Father    Anxiety disorder Father    Cancer Father    Breast cancer Maternal Aunt    Cancer Maternal Aunt        beast   Colon cancer Neg Hx    Esophageal cancer Neg Hx    Rectal cancer Neg Hx    Stomach cancer Neg Hx    Allergies  Allergen Reactions   Hydrocodone Other (See Comments)    hallucinations   Lisinopril Swelling and Other (See Comments)    Cough  Review of Systems  Constitutional:  Negative for chills, fever and malaise/fatigue.  HENT:  Negative for congestion and hearing loss.   Eyes:  Negative for discharge.  Respiratory:  Negative for cough, sputum production and shortness of breath.   Cardiovascular:  Negative for chest pain, palpitations and leg swelling.  Gastrointestinal:  Negative for abdominal pain, blood in stool, constipation, diarrhea, heartburn, nausea and vomiting.  Genitourinary:  Negative for dysuria, frequency, hematuria and urgency.  Musculoskeletal:  Negative for back pain, falls and myalgias.  Skin:  Negative for rash.  Neurological:  Negative for dizziness, sensory change, loss of consciousness, weakness and headaches.  Endo/Heme/Allergies:  Negative for environmental allergies. Does not bruise/bleed easily.  Psychiatric/Behavioral:  Negative for depression and suicidal ideas. The patient is not nervous/anxious and does not have insomnia.       Objective:     BP (!) 132/90 (BP Location: Left Arm, Patient Position: Sitting, Cuff Size: Large)   Pulse 89   Temp 98.9 F (37.2 C) (Oral)   Resp 18   Ht 5\' 6"  (1.676 m)   Wt 245 lb 9.6 oz (111.4 kg)   LMP 08/13/2014   SpO2 96%   BMI 39.64 kg/m  BP Readings from Last 3 Encounters:  04/06/23 (!) 132/90  03/02/23 133/88  12/22/22 139/89   Wt Readings from Last 3 Encounters:  04/06/23 245 lb 9.6 oz (111.4 kg)  03/02/23 239 lb (108.4 kg)  12/22/22 237 lb (107.5 kg)   SpO2 Readings from Last 3  Encounters:  04/06/23 96%  03/02/23 95%  12/22/22 100%      Physical Exam Vitals and nursing note reviewed.  Constitutional:      General: She is not in acute distress.    Appearance: Normal appearance. She is well-developed.  HENT:     Head: Normocephalic and atraumatic.     Right Ear: Tympanic membrane, ear canal and external ear normal. There is no impacted cerumen.     Left Ear: Tympanic membrane, ear canal and external ear normal. There is no impacted cerumen.     Nose: Nose normal.     Mouth/Throat:     Mouth: Mucous membranes are moist.     Pharynx: Oropharynx is clear. No oropharyngeal exudate or posterior oropharyngeal erythema.  Eyes:     General: No scleral icterus.       Right eye: No discharge.        Left eye: No discharge.     Conjunctiva/sclera: Conjunctivae normal.     Pupils: Pupils are equal, round, and reactive to light.  Neck:     Thyroid: No thyromegaly or thyroid tenderness.     Vascular: No JVD.  Cardiovascular:     Rate and Rhythm: Normal rate and regular rhythm.     Heart sounds: Normal heart sounds. No murmur heard. Pulmonary:     Effort: Pulmonary effort is normal. No respiratory distress.     Breath sounds: Normal breath sounds.  Abdominal:     General: Bowel sounds are normal. There is no distension.     Palpations: Abdomen is soft. There is no mass.     Tenderness: There is no abdominal tenderness. There is no guarding or rebound.  Musculoskeletal:        General: Normal range of motion.     Cervical back: Normal range of motion and neck supple.     Right lower leg: No edema.     Left lower leg: No edema.  Lymphadenopathy:  Cervical: No cervical adenopathy.  Skin:    General: Skin is warm and dry.     Findings: No erythema or rash.  Neurological:     Mental Status: She is alert and oriented to person, place, and time.     Cranial Nerves: No cranial nerve deficit.     Deep Tendon Reflexes: Reflexes are normal and symmetric.   Psychiatric:        Mood and Affect: Mood normal.        Behavior: Behavior normal.        Thought Content: Thought content normal.        Judgment: Judgment normal.      No results found for any visits on 04/06/23.  Last CBC Lab Results  Component Value Date   WBC 6.6 10/12/2022   HGB 13.3 10/12/2022   HCT 41.2 10/12/2022   MCV 93 10/12/2022   MCH 30.2 10/12/2022   RDW 12.9 10/12/2022   PLT 411 10/12/2022   Last metabolic panel Lab Results  Component Value Date   GLUCOSE 93 10/12/2022   NA 144 10/12/2022   K 4.5 10/12/2022   CL 103 10/12/2022   CO2 24 10/12/2022   BUN 14 10/12/2022   CREATININE 0.76 10/12/2022   EGFR 95 10/12/2022   CALCIUM 9.7 10/12/2022   PROT 6.7 10/12/2022   ALBUMIN 4.2 10/12/2022   LABGLOB 2.5 10/12/2022   AGRATIO 1.9 03/30/2022   BILITOT 0.3 10/12/2022   ALKPHOS 70 10/12/2022   AST 11 10/12/2022   ALT 14 10/12/2022   ANIONGAP 7 08/31/2014   Last lipids Lab Results  Component Value Date   CHOL 203 (H) 10/12/2022   HDL 43 10/12/2022   LDLCALC 133 (H) 10/12/2022   TRIG 150 (H) 10/12/2022   CHOLHDL 3 08/12/2021   Last hemoglobin A1c Lab Results  Component Value Date   HGBA1C 6.2 (H) 10/12/2022   Last thyroid functions Lab Results  Component Value Date   TSH 1.660 10/12/2022   T3TOTAL 111 11/05/2016   Last vitamin D Lab Results  Component Value Date   VD25OH 41.9 10/12/2022   Last vitamin B12 and Folate Lab Results  Component Value Date   VITAMINB12 266 08/03/2019   FOLATE 6.7 08/03/2019      The 10-year ASCVD risk score (Arnett DK, et al., 2019) is: 14%    Assessment & Plan:   Problem List Items Addressed This Visit       Unprioritized   Depression   Relevant Medications   escitalopram (LEXAPRO) 20 MG tablet   buPROPion (WELLBUTRIN XL) 300 MG 24 hr tablet   Preventative health care - Primary   Diabetes mellitus (HCC)   Relevant Medications   tirzepatide (MOUNJARO) 12.5 MG/0.5ML Pen   Other Relevant  Orders   Hemoglobin A1c   Microalbumin / creatinine urine ratio   Insulin, random   Morbid obesity (HCC)   Relevant Medications   tirzepatide (MOUNJARO) 12.5 MG/0.5ML Pen   Other Relevant Orders   Insulin, random   Vitamin B12   VITAMIN D 25 Hydroxy (Vit-D Deficiency, Fractures)   Essential hypertension   Relevant Medications   amLODipine (NORVASC) 10 MG tablet   nebivolol (BYSTOLIC) 10 MG tablet   Other Relevant Orders   CBC with Differential/Platelet   Comprehensive metabolic panel   Depression with anxiety   Relevant Medications   escitalopram (LEXAPRO) 20 MG tablet   buPROPion (WELLBUTRIN XL) 300 MG 24 hr tablet   Other Visit Diagnoses  Hyperlipidemia associated with type 2 diabetes mellitus (HCC)       Relevant Medications   amLODipine (NORVASC) 10 MG tablet   nebivolol (BYSTOLIC) 10 MG tablet   tirzepatide (MOUNJARO) 12.5 MG/0.5ML Pen   Other Relevant Orders   Lipid panel   CBC with Differential/Platelet   Comprehensive metabolic panel     High risk heterosexual behavior       Relevant Orders   Cervicovaginal ancillary only( [place])     Assessment and Plan    Sexually Transmitted Infection (STI) Screening   She is concerned about potential STI exposure due to suspicions of her boyfriend's infidelity. She has herpes and a history of chlamydia. Asymptomatic but cautious given past experiences. Chlamydia can be asymptomatic, while gonorrhea typically presents with discharge. She declined HIV testing. Perform self-swab for chlamydia and gonorrhea. Discuss treatment options, including oral antibiotics or injection if needed.  Weight Management   Currently on Mounjaro for weight management, reporting positive results. Considering increasing the dose from 10 mg due to weight loss and improved fit of clothing. No adverse effects reported. Increase Mounjaro dose and discontinue lower dose prescription.  Hypertension   On amlodipine and Bystolic for hypertension  management. Blood pressure is well-controlled with no adverse effects from the current regimen. Continue amlodipine and Bystolic.  Depression   On Lexapro for depression. No discussion of current depressive symptoms or medication issues. Continue Lexapro.  Caregiver Stress   Managing father's care, who has dementia and Parkinson's, impacting her ability to manage her own health. Primary local caregiver, experiencing stress due to responsibilities. Consider discussing caregiver support options for managing father's care.  General Health Maintenance   Due for pneumonia vaccine at age 47 but prefers to wait until physical. Regular eye exams with an upcoming dilation appointment. Wait for pneumonia vaccine until physical. Continue regular eye exams with Dr. Lillia Abed.  Follow-up   Follow-up appointment with Dr. Cherly Hensen on April 17th. Follow up with Dr. Cherly Hensen on April 17th.        No follow-ups on file.    Donato Schultz, DO

## 2023-04-06 NOTE — Telephone Encounter (Signed)
 Copied from CRM [id number]. Topic: Clinical - Request for Lab/Test Order >> Apr 06, 2023  2:40 PM Sim Boast F wrote: Reason for CRM: Patient received a MyChart message regarding a cytology order, it looks like its under admissions tab in the appointment desk and she's not sure what that is. Please call her to clarify.She would like a call back today but if she doesn't answer please leave a detailed message. >> Apr 06, 2023  3:10 PM CMA Vicente Males C wrote: Was the pap that was done today

## 2023-04-06 NOTE — Patient Instructions (Signed)
 Preventive Care 16-52 Years Old, Female  Preventive care refers to lifestyle choices and visits with your health care provider that can promote health and wellness. Preventive care visits are also called wellness exams.  What can I expect for my preventive care visit?  Counseling  Your health care provider may ask you questions about your:  Medical history, including:  Past medical problems.  Family medical history.  Pregnancy history.  Current health, including:  Menstrual cycle.  Method of birth control.  Emotional well-being.  Home life and relationship well-being.  Sexual activity and sexual health.  Lifestyle, including:  Alcohol, nicotine or tobacco, and drug use.  Access to firearms.  Diet, exercise, and sleep habits.  Work and work Astronomer.  Sunscreen use.  Safety issues such as seatbelt and bike helmet use.  Physical exam  Your health care provider will check your:  Height and weight. These may be used to calculate your BMI (body mass index). BMI is a measurement that tells if you are at a healthy weight.  Waist circumference. This measures the distance around your waistline. This measurement also tells if you are at a healthy weight and may help predict your risk of certain diseases, such as type 2 diabetes and high blood pressure.  Heart rate and blood pressure.  Body temperature.  Skin for abnormal spots.  What immunizations do I need?    Vaccines are usually given at various ages, according to a schedule. Your health care provider will recommend vaccines for you based on your age, medical history, and lifestyle or other factors, such as travel or where you work.  What tests do I need?  Screening  Your health care provider may recommend screening tests for certain conditions. This may include:  Lipid and cholesterol levels.  Diabetes screening. This is done by checking your blood sugar (glucose) after you have not eaten for a while (fasting).  Pelvic exam and Pap test.  Hepatitis B test.  Hepatitis C  test.  HIV (human immunodeficiency virus) test.  STI (sexually transmitted infection) testing, if you are at risk.  Lung cancer screening.  Colorectal cancer screening.  Mammogram. Talk with your health care provider about when you should start having regular mammograms. This may depend on whether you have a family history of breast cancer.  BRCA-related cancer screening. This may be done if you have a family history of breast, ovarian, tubal, or peritoneal cancers.  Bone density scan. This is done to screen for osteoporosis.  Talk with your health care provider about your test results, treatment options, and if necessary, the need for more tests.  Follow these instructions at home:  Eating and drinking    Eat a diet that includes fresh fruits and vegetables, whole grains, lean protein, and low-fat dairy products.  Take vitamin and mineral supplements as recommended by your health care provider.  Do not drink alcohol if:  Your health care provider tells you not to drink.  You are pregnant, may be pregnant, or are planning to become pregnant.  If you drink alcohol:  Limit how much you have to 0-1 drink a day.  Know how much alcohol is in your drink. In the [place] one drink equals one 12 oz bottle of beer (355 mL), one 5 oz glass of wine (148 mL), or one 1 oz glass of hard liquor (44 mL).  Lifestyle  Brush your teeth every morning and night with fluoride toothpaste. Floss one time each day.  Exercise for at least  30 minutes 5 or more days each week.  Do not use any products that contain nicotine or tobacco. These products include cigarettes, chewing tobacco, and vaping devices, such as e-cigarettes. If you need help quitting, ask your health care provider.  Do not use drugs.  If you are sexually active, practice safe sex. Use a condom or other form of protection to prevent STIs.  If you do not wish to become pregnant, use a form of birth control. If you plan to become pregnant, see your health care provider for a  prepregnancy visit.  Take aspirin only as told by your health care provider. Make sure that you understand how much to take and what form to take. Work with your health care provider to find out whether it is safe and beneficial for you to take aspirin daily.  Find healthy ways to manage stress, such as:  Meditation, yoga, or listening to music.  Journaling.  Talking to a trusted person.  Spending time with friends and family.  Minimize exposure to UV radiation to reduce your risk of skin cancer.  Safety  Always wear your seat belt while driving or riding in a vehicle.  Do not drive:  If you have been drinking alcohol. Do not ride with someone who has been drinking.  When you are tired or distracted.  While texting.  If you have been using any mind-altering substances or drugs.  Wear a helmet and other protective equipment during sports activities.  If you have firearms in your house, make sure you follow all gun safety procedures.  Seek help if you have been physically or sexually abused.  What's next?  Visit your health care provider once a year for an annual wellness visit.  Ask your health care provider how often you should have your eyes and teeth checked.  Stay up to date on all vaccines.  This information is not intended to replace advice given to you by your health care provider. Make sure you discuss any questions you have with your health care provider.  Document Revised: 07/10/2020 Document Reviewed: 07/10/2020  Elsevier Patient Education  2024 [employer].

## 2023-04-07 ENCOUNTER — Other Ambulatory Visit (HOSPITAL_BASED_OUTPATIENT_CLINIC_OR_DEPARTMENT_OTHER): Payer: Self-pay

## 2023-04-07 ENCOUNTER — Other Ambulatory Visit: Payer: Self-pay

## 2023-04-07 LAB — CERVICOVAGINAL ANCILLARY ONLY
Bacterial Vaginitis (gardnerella): NEGATIVE
Candida Glabrata: NEGATIVE
Candida Vaginitis: NEGATIVE
Chlamydia: NEGATIVE
Comment: NEGATIVE
Comment: NEGATIVE
Comment: NEGATIVE
Comment: NEGATIVE
Comment: NEGATIVE
Comment: NORMAL
Neisseria Gonorrhea: NEGATIVE
Trichomonas: NEGATIVE

## 2023-04-07 LAB — INSULIN, RANDOM: Insulin: 27.8 u[IU]/mL — ABNORMAL HIGH

## 2023-04-07 MED ORDER — FUROSEMIDE 20 MG PO TABS
20.0000 mg | ORAL_TABLET | Freq: Every day | ORAL | 1 refills | Status: DC
  Filled 2023-04-07: qty 90, 90d supply, fill #0

## 2023-04-07 MED ORDER — ROSUVASTATIN CALCIUM 10 MG PO TABS
10.0000 mg | ORAL_TABLET | Freq: Every day | ORAL | 0 refills | Status: DC
  Filled 2023-04-07: qty 30, 30d supply, fill #0

## 2023-04-07 NOTE — Addendum Note (Signed)
 Addended by: Roxanne Gates on: 04/07/2023 10:21 AM   Modules accepted: Orders

## 2023-04-07 NOTE — Telephone Encounter (Signed)
 Pt called and verbalized understanding. Pt also requested some meds that were not refilled yesterday. Rx sent downstairs

## 2023-04-08 ENCOUNTER — Encounter: Payer: Self-pay | Admitting: Family Medicine

## 2023-04-08 ENCOUNTER — Other Ambulatory Visit: Payer: Self-pay | Admitting: Family Medicine

## 2023-04-09 ENCOUNTER — Ambulatory Visit

## 2023-04-09 ENCOUNTER — Other Ambulatory Visit (HOSPITAL_BASED_OUTPATIENT_CLINIC_OR_DEPARTMENT_OTHER): Payer: Self-pay

## 2023-04-09 ENCOUNTER — Other Ambulatory Visit: Payer: Self-pay | Admitting: Family Medicine

## 2023-04-09 DIAGNOSIS — E559 Vitamin D deficiency, unspecified: Secondary | ICD-10-CM

## 2023-04-09 DIAGNOSIS — E538 Deficiency of other specified B group vitamins: Secondary | ICD-10-CM

## 2023-04-09 MED ORDER — VITAMIN D (ERGOCALCIFEROL) 1.25 MG (50000 UNIT) PO CAPS
50000.0000 [IU] | ORAL_CAPSULE | ORAL | 0 refills | Status: AC
  Filled 2023-04-09: qty 12, 84d supply, fill #0

## 2023-04-09 MED ORDER — ROSUVASTATIN CALCIUM 20 MG PO TABS
20.0000 mg | ORAL_TABLET | Freq: Every day | ORAL | 2 refills | Status: DC
  Filled 2023-04-09: qty 30, 30d supply, fill #0

## 2023-04-09 MED ORDER — CYANOCOBALAMIN 1000 MCG/ML IJ SOLN
1000.0000 ug | Freq: Once | INTRAMUSCULAR | Status: AC
  Administered 2023-04-09: 1000 ug via INTRAMUSCULAR

## 2023-04-09 MED ORDER — VITAMIN D (ERGOCALCIFEROL) 1.25 MG (50000 UNIT) PO CAPS
50000.0000 [IU] | ORAL_CAPSULE | ORAL | 0 refills | Status: AC
  Filled 2023-04-09 – 2024-02-04 (×2): qty 12, 84d supply, fill #0

## 2023-04-09 NOTE — Telephone Encounter (Signed)
 Copied from CRM [id number]. Topic: Clinical - Prescription Issue >> Apr 09, 2023 11:09 AM Kathryne Eriksson wrote: Reason for CRM: rosuvastatin (CRESTOR) 10 MG tablet >> Apr 09, 2023 11:11 AM Kathryne Eriksson wrote: Patient states her dosage amount should've been increased from 10 MG to 20 MG and it hasn't been completed yet.

## 2023-04-09 NOTE — Progress Notes (Signed)
 [name] is a 53 y.o. female presents to the office today for 1/4 weekly B12  injection, per physician's orders. Original order: 04/06/23: "Vita d and b12 low----- she may benefit from b12 shots weekly x4 and then monthly." Cyanocobalamin 1000 mg/ml IM was administered L Deltoid today. Patient tolerated injection. Patient due for follow up labs/provider appt: No. (Recheck labs in 3 months.) Patient next injection due: 1 week for 2/4 weekly B12 injection, appt made Yes   Creft, Melton Alar L

## 2023-04-09 NOTE — Addendum Note (Signed)
 Addended by: Roxanne Gates on: 04/09/2023 01:41 PM   Modules accepted: Orders

## 2023-04-16 ENCOUNTER — Ambulatory Visit: Admitting: Emergency Medicine

## 2023-04-16 DIAGNOSIS — E538 Deficiency of other specified B group vitamins: Secondary | ICD-10-CM

## 2023-04-16 MED ORDER — CYANOCOBALAMIN 1000 MCG/ML IJ SOLN
1000.0000 ug | Freq: Once | INTRAMUSCULAR | Status: AC
  Administered 2023-04-16: 1000 ug via INTRAMUSCULAR

## 2023-04-16 NOTE — Progress Notes (Signed)
 Patient here for monthly b12 injection per physicians order.  Injection given in left deltoid and patient tolerated well.

## 2023-04-23 ENCOUNTER — Ambulatory Visit

## 2023-04-23 DIAGNOSIS — E538 Deficiency of other specified B group vitamins: Secondary | ICD-10-CM | POA: Diagnosis not present

## 2023-04-23 MED ORDER — CYANOCOBALAMIN 1000 MCG/ML IJ SOLN
1000.0000 ug | Freq: Once | INTRAMUSCULAR | Status: AC
  Administered 2023-04-23: 1000 ug via INTRAMUSCULAR

## 2023-04-23 NOTE — Progress Notes (Signed)
 Pt here for weekly B12 injection per Lowne  B12 given IM, and pt tolerated injection well.  Next B12 injection scheduled for next week

## 2023-04-30 ENCOUNTER — Ambulatory Visit

## 2023-04-30 DIAGNOSIS — E538 Deficiency of other specified B group vitamins: Secondary | ICD-10-CM

## 2023-04-30 MED ORDER — CYANOCOBALAMIN 1000 MCG/ML IJ SOLN
1000.0000 ug | Freq: Once | INTRAMUSCULAR | Status: AC
Start: 2023-04-30 — End: 2023-04-30
  Administered 2023-04-30: 1000 ug via INTRAMUSCULAR

## 2023-04-30 NOTE — Progress Notes (Signed)
 [name] is a 53 y.o. female presents to the office today for 4/4 weekly B12 injection, per physician's orders. Original order: 04/06/23: "Vita d and b12 low----- she may benefit from b12 shots weekly x4 and then monthly." Cyanocobalamin 1000 mg/ml IM was administered L Deltoid today. Patient tolerated injection. Patient due for follow up labs/provider appt: No. (Recheck labs around June 11,2025) Patient next injection due: 1 Month for 1st Monthly B12 injection, appt made Yes   Creft, Melton Alar L

## 2023-06-04 ENCOUNTER — Ambulatory Visit (INDEPENDENT_AMBULATORY_CARE_PROVIDER_SITE_OTHER): Admitting: *Deleted

## 2023-06-04 DIAGNOSIS — E559 Vitamin D deficiency, unspecified: Secondary | ICD-10-CM

## 2023-06-04 DIAGNOSIS — I10 Essential (primary) hypertension: Secondary | ICD-10-CM

## 2023-06-04 DIAGNOSIS — E538 Deficiency of other specified B group vitamins: Secondary | ICD-10-CM | POA: Diagnosis not present

## 2023-06-04 DIAGNOSIS — E1169 Type 2 diabetes mellitus with other specified complication: Secondary | ICD-10-CM

## 2023-06-04 MED ORDER — CYANOCOBALAMIN 1000 MCG/ML IJ SOLN
1000.0000 ug | Freq: Once | INTRAMUSCULAR | Status: AC
  Administered 2023-06-04: 1000 ug via INTRAMUSCULAR

## 2023-06-04 NOTE — Progress Notes (Signed)
 Pt here for monthly B12 injection per original order dated: 04/06/23.  Last B12 injection: 04/30/23  Last B12 level:  04/06/23  B12 1000mcg given IM, and pt tolerated injection well.  Next B12 injection scheduled for: 07/02/23 @ 9:45am and lab visit at 10 to recheck labs from 04/06/23 per lab result note.

## 2023-07-02 ENCOUNTER — Ambulatory Visit

## 2023-07-02 ENCOUNTER — Other Ambulatory Visit (HOSPITAL_COMMUNITY): Payer: Self-pay

## 2023-07-02 ENCOUNTER — Telehealth: Payer: Self-pay

## 2023-07-02 ENCOUNTER — Other Ambulatory Visit: Payer: Self-pay

## 2023-07-02 ENCOUNTER — Other Ambulatory Visit

## 2023-07-02 ENCOUNTER — Other Ambulatory Visit (HOSPITAL_BASED_OUTPATIENT_CLINIC_OR_DEPARTMENT_OTHER): Payer: Self-pay

## 2023-07-02 DIAGNOSIS — E1169 Type 2 diabetes mellitus with other specified complication: Secondary | ICD-10-CM

## 2023-07-02 DIAGNOSIS — I10 Essential (primary) hypertension: Secondary | ICD-10-CM

## 2023-07-02 DIAGNOSIS — E538 Deficiency of other specified B group vitamins: Secondary | ICD-10-CM

## 2023-07-02 MED ORDER — TIRZEPATIDE 15 MG/0.5ML ~~LOC~~ SOAJ
15.0000 mg | SUBCUTANEOUS | 1 refills | Status: AC
  Filled 2023-07-02 – 2023-10-19 (×3): qty 6, 84d supply, fill #0
  Filled 2024-02-04: qty 6, 84d supply, fill #1

## 2023-07-02 MED ORDER — AMLODIPINE BESYLATE 10 MG PO TABS
10.0000 mg | ORAL_TABLET | Freq: Every day | ORAL | 1 refills | Status: DC
  Filled 2023-07-02: qty 90, 90d supply, fill #0
  Filled 2023-10-19: qty 90, 90d supply, fill #1

## 2023-07-02 MED ORDER — ROSUVASTATIN CALCIUM 20 MG PO TABS
20.0000 mg | ORAL_TABLET | Freq: Every day | ORAL | 1 refills | Status: DC
  Filled 2023-07-02: qty 90, 90d supply, fill #0
  Filled 2023-10-19: qty 90, 90d supply, fill #1

## 2023-07-02 MED ORDER — POTASSIUM CHLORIDE CRYS ER 10 MEQ PO TBCR
10.0000 meq | EXTENDED_RELEASE_TABLET | Freq: Every day | ORAL | 1 refills | Status: DC
  Filled 2023-07-02: qty 90, 90d supply, fill #0
  Filled 2023-10-19: qty 90, 90d supply, fill #1

## 2023-07-02 MED ORDER — FUROSEMIDE 20 MG PO TABS
20.0000 mg | ORAL_TABLET | Freq: Every day | ORAL | 1 refills | Status: DC
  Filled 2023-07-02: qty 90, 90d supply, fill #0
  Filled 2023-10-19: qty 90, 90d supply, fill #1

## 2023-07-02 MED ORDER — CYANOCOBALAMIN 1000 MCG/ML IJ SOLN
1000.0000 ug | Freq: Once | INTRAMUSCULAR | Status: AC
  Administered 2023-07-02: 1000 ug via INTRAMUSCULAR

## 2023-07-02 MED ORDER — NEBIVOLOL HCL 10 MG PO TABS
10.0000 mg | ORAL_TABLET | Freq: Every day | ORAL | 1 refills | Status: DC
  Filled 2023-07-02: qty 90, 90d supply, fill #0
  Filled 2023-10-19: qty 90, 90d supply, fill #1

## 2023-07-02 NOTE — Progress Notes (Signed)
 Pt here for monthly B12 injection per original order dated: 04/06/23, Kelsie Patrick Chase,DO b12 shots weekly x4 and then monthly   Last B12 injection:06/04/23  Last B12 level:  04/06/23  B12 1000mcg given IM, left deltoid and pt tolerated injection well.  Next B12 injection scheduled for: 08/04/23

## 2023-07-02 NOTE — Telephone Encounter (Signed)
 Patient was in for her B12 injection and wanted a message send to Dr. Crecencio Dodge about increasing her Mounjaro  12.5 MG/ 0.5 ML or do she suppose to stay that the same dose. Please advise.

## 2023-07-02 NOTE — Addendum Note (Signed)
 Addended by: Ysidro Her on: 07/02/2023 04:03 PM   Modules accepted: Orders

## 2023-07-02 NOTE — Telephone Encounter (Signed)
 Next dose sent. Medication may need PA

## 2023-07-02 NOTE — Telephone Encounter (Signed)
 Pharmacy Patient Advocate Encounter   Received notification from CoverMyMeds that prior authorization for Mounjaro  is required/requested.   Insurance verification completed.   The patient is insured through [facility] ADVANTAGE/RX ADVANCE .   Per test claim: PA required; PA submitted to above mentioned insurance via CoverMyMeds Key/confirmation #/EOC [id number] Status is pending

## 2023-07-05 ENCOUNTER — Other Ambulatory Visit (HOSPITAL_COMMUNITY): Payer: Self-pay

## 2023-07-05 ENCOUNTER — Telehealth: Payer: Self-pay

## 2023-07-05 ENCOUNTER — Other Ambulatory Visit (HOSPITAL_BASED_OUTPATIENT_CLINIC_OR_DEPARTMENT_OTHER): Payer: Self-pay

## 2023-07-05 NOTE — Telephone Encounter (Signed)
 Pharmacy Patient Advocate Encounter   Received notification from Pt Calls Messages that prior authorization for Mounjaro  15mg /0.38ml is required/requested.   Insurance verification completed.   The patient is insured through [facility] .   Per test claim: PA required; PA submitted to above mentioned insurance via CoverMyMeds Key/confirmation #/EOC [facility] Status is pending

## 2023-07-05 NOTE — Telephone Encounter (Addendum)
 Pharmacy Patient Advocate Encounter  Received notification from [facility] that Prior Authorization for Mounjaro  15mg /0.85ml has been Denied. Will resubmit.   See new telephone encounter from 07/05/23

## 2023-07-06 ENCOUNTER — Other Ambulatory Visit (HOSPITAL_BASED_OUTPATIENT_CLINIC_OR_DEPARTMENT_OTHER): Payer: Self-pay

## 2023-07-06 ENCOUNTER — Other Ambulatory Visit (HOSPITAL_COMMUNITY): Payer: Self-pay

## 2023-07-06 NOTE — Telephone Encounter (Signed)
 Pharmacy Patient Advocate Encounter  Received notification from [facility] that Prior Authorization for Mounjaro  15MG /0.5ML auto-injectors  has been APPROVED from 07/05/23 to 07/05/26. Unable to obtain price due to refill too soon rejection, last fill date 07/05/23 next available fill date07/01/25   PA #/Case ID/Reference #: [id number]

## 2023-08-04 ENCOUNTER — Ambulatory Visit

## 2023-08-04 ENCOUNTER — Other Ambulatory Visit

## 2023-08-04 NOTE — Progress Notes (Deleted)
 Pt here for monthly B12 injection per Jamee Shanks Chase DO  B12 1000mcg given IM and pt tolerated injection well.  Next B12 injection scheduled for

## 2023-08-16 ENCOUNTER — Other Ambulatory Visit (INDEPENDENT_AMBULATORY_CARE_PROVIDER_SITE_OTHER)

## 2023-08-16 ENCOUNTER — Ambulatory Visit (INDEPENDENT_AMBULATORY_CARE_PROVIDER_SITE_OTHER)

## 2023-08-16 DIAGNOSIS — E785 Hyperlipidemia, unspecified: Secondary | ICD-10-CM | POA: Diagnosis not present

## 2023-08-16 DIAGNOSIS — E538 Deficiency of other specified B group vitamins: Secondary | ICD-10-CM

## 2023-08-16 DIAGNOSIS — E559 Vitamin D deficiency, unspecified: Secondary | ICD-10-CM | POA: Diagnosis not present

## 2023-08-16 DIAGNOSIS — E1169 Type 2 diabetes mellitus with other specified complication: Secondary | ICD-10-CM

## 2023-08-16 DIAGNOSIS — I10 Essential (primary) hypertension: Secondary | ICD-10-CM

## 2023-08-16 LAB — CBC WITH DIFFERENTIAL/PLATELET
Basophils Absolute: 0.1 K/uL (ref 0.0–0.1)
Basophils Relative: 1.3 % (ref 0.0–3.0)
Eosinophils Absolute: 0.1 K/uL (ref 0.0–0.7)
Eosinophils Relative: 1.6 % (ref 0.0–5.0)
HCT: 41.3 % (ref 36.0–46.0)
Hemoglobin: 13.5 g/dL (ref 12.0–15.0)
Lymphocytes Relative: 46.3 % — ABNORMAL HIGH (ref 12.0–46.0)
Lymphs Abs: 3.1 K/uL (ref 0.7–4.0)
MCHC: 32.7 g/dL (ref 30.0–36.0)
MCV: 92.4 fl (ref 78.0–100.0)
Monocytes Absolute: 0.3 K/uL (ref 0.1–1.0)
Monocytes Relative: 5.1 % (ref 3.0–12.0)
Neutro Abs: 3.1 K/uL (ref 1.4–7.7)
Neutrophils Relative %: 45.7 % (ref 43.0–77.0)
Platelets: 404 K/uL — ABNORMAL HIGH (ref 150.0–400.0)
RBC: 4.47 Mil/uL (ref 3.87–5.11)
RDW: 14.5 % (ref 11.5–15.5)
WBC: 6.7 K/uL (ref 4.0–10.5)

## 2023-08-16 LAB — LIPID PANEL
Cholesterol: 133 mg/dL (ref 0–200)
HDL: 38.5 mg/dL — ABNORMAL LOW (ref >39.00)
LDL Cholesterol: 62 mg/dL (ref 0–99)
NonHDL: 94.47
Total CHOL/HDL Ratio: 3
Triglycerides: 160 mg/dL — ABNORMAL HIGH (ref 0.0–149.0)
VLDL: 32 mg/dL (ref 0.0–40.0)

## 2023-08-16 LAB — VITAMIN D 25 HYDROXY (VIT D DEFICIENCY, FRACTURES): VITD: 45.29 ng/mL (ref 30.00–100.00)

## 2023-08-16 LAB — VITAMIN B12: Vitamin B-12: 297 pg/mL (ref 211–911)

## 2023-08-16 LAB — HEMOGLOBIN A1C: Hgb A1c MFr Bld: 6.1 % (ref 4.6–6.5)

## 2023-08-16 MED ORDER — CYANOCOBALAMIN 1000 MCG/ML IJ SOLN
1000.0000 ug | Freq: Once | INTRAMUSCULAR | Status: AC
  Administered 2023-08-16: 1000 ug via INTRAMUSCULAR

## 2023-08-16 NOTE — Progress Notes (Signed)
 Pt here for monthly B12 injection per Lowne  Last B12 injection: 07/02/2023  Last B12 level:  04/06/23  B12 1000mcg given IM, and pt tolerated injection well.  Next B12 shot appt put on hold until B12 labs come back.

## 2023-08-22 ENCOUNTER — Ambulatory Visit: Payer: Self-pay | Admitting: Family Medicine

## 2023-10-19 ENCOUNTER — Telehealth: Payer: Self-pay | Admitting: Family Medicine

## 2023-10-19 ENCOUNTER — Other Ambulatory Visit (HOSPITAL_BASED_OUTPATIENT_CLINIC_OR_DEPARTMENT_OTHER): Payer: Self-pay

## 2023-10-19 NOTE — Telephone Encounter (Signed)
 Pt dropped off a paper to be signed by pcp. Please call pt when paper has been faxed and ready to be picked up. Placed paper in pcps box.

## 2023-10-22 NOTE — Telephone Encounter (Signed)
 Placed in folder

## 2023-10-22 NOTE — Telephone Encounter (Addendum)
 Faxed---ERROR

## 2023-10-27 NOTE — Telephone Encounter (Signed)
 Pt called. LVM advising disability placard was ready to be picked up.

## 2024-02-04 ENCOUNTER — Other Ambulatory Visit (HOSPITAL_BASED_OUTPATIENT_CLINIC_OR_DEPARTMENT_OTHER): Payer: Self-pay

## 2024-02-04 ENCOUNTER — Other Ambulatory Visit: Payer: Self-pay | Admitting: Family Medicine

## 2024-02-04 DIAGNOSIS — I10 Essential (primary) hypertension: Secondary | ICD-10-CM

## 2024-02-06 ENCOUNTER — Other Ambulatory Visit: Payer: Self-pay

## 2024-02-07 ENCOUNTER — Other Ambulatory Visit (HOSPITAL_BASED_OUTPATIENT_CLINIC_OR_DEPARTMENT_OTHER): Payer: Self-pay

## 2024-02-07 MED ORDER — AMLODIPINE BESYLATE 10 MG PO TABS
10.0000 mg | ORAL_TABLET | Freq: Every day | ORAL | 1 refills | Status: AC
  Filled 2024-02-07: qty 90, 90d supply, fill #0

## 2024-02-07 MED ORDER — POTASSIUM CHLORIDE CRYS ER 10 MEQ PO TBCR
10.0000 meq | EXTENDED_RELEASE_TABLET | Freq: Every day | ORAL | 1 refills | Status: AC
  Filled 2024-02-07: qty 90, 90d supply, fill #0

## 2024-02-07 MED ORDER — NEBIVOLOL HCL 10 MG PO TABS
10.0000 mg | ORAL_TABLET | Freq: Every day | ORAL | 1 refills | Status: AC
  Filled 2024-02-07: qty 90, 90d supply, fill #0

## 2024-02-07 MED ORDER — ROSUVASTATIN CALCIUM 20 MG PO TABS
20.0000 mg | ORAL_TABLET | Freq: Every day | ORAL | 1 refills | Status: AC
  Filled 2024-02-07: qty 90, 90d supply, fill #0

## 2024-02-07 MED ORDER — FUROSEMIDE 20 MG PO TABS
20.0000 mg | ORAL_TABLET | Freq: Every day | ORAL | 1 refills | Status: AC
  Filled 2024-02-07: qty 90, 90d supply, fill #0

## 2024-02-15 ENCOUNTER — Ambulatory Visit: Payer: Self-pay

## 2024-02-15 ENCOUNTER — Telehealth: Payer: Self-pay | Admitting: Family Medicine

## 2024-02-15 NOTE — Telephone Encounter (Signed)
 OV scheduled 02/18/24. Pt is asking if appt can be converted to virtual if she is able to record her BP readings at home. Please contact via phone (not MyChart) to advise. Pt also requesting work note, advised may need to be discussed at OV.  FYI Only or Action Required?: Action required by provider: clinical question for provider.  Patient was last seen in primary care on 04/06/2023 by Antonio Meth, Jamee SAUNDERS, DO.  Called Nurse Triage reporting Hypertension.  Symptoms began today.  Interventions attempted: Prescription medications: amlodipine , nebivolol .  Symptoms are: gradually improving.  Triage Disposition: Urgent Home Treatment With Follow-up Call  Patient/caregiver understands and will follow disposition?:  Reason for Disposition  [1] Systolic BP >= 180 OR Diastolic >= 110 AND [2] missed most recent dose of blood pressure medication  Answer Assessment - Initial Assessment Questions Pt reports fatigue and HTN. Requesting note to excuse her for work d/t her HTN and fatigue. States she is up at night caring for her father. Woke up with a headache, states is now mild since taking her medications.  ED advised if she develops a severe headache, higher BP despite taking her medications, dizziness, lightheadedness, chest pain, vision changes, weakness, numbness. Pt verbalized understanding.  1. BLOOD PRESSURE: What is your blood pressure? Did you take at least two measurements 5 minutes apart?     160/105 this morning, but forgot to take her amlodipine  yesterday. Took it after checking her BP but has not rechecked her BP as she had to take her father to the ED.   2. ONSET: When did you take your blood pressure?     This morning  3. HOW: How did you take your blood pressure? (e.g., automatic home BP monitor, visiting nurse)     Automatic cuff  4. HISTORY: Do you have a history of high blood pressure?     Yes  5. MEDICINES: Are you taking any medicines for blood pressure?  Have you missed any doses recently?     Amlodipine , nebivolol  6. OTHER SYMPTOMS: Do you have any symptoms? (e.g., blurred vision, chest pain, difficulty breathing, headache, weakness)     Had a headache this morning prior to taking BP meds, now mild  Protocols used: Blood Pressure - High-A-AH Copied from CRM #[id number]. 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 Pt is asking if the provider can provide her a excuse from work letter for a few days to try and get some rest and help get her blood pressure down.  She sates the medication is working but she's not able to get rest in between work and taking care of her father

## 2024-02-15 NOTE — Telephone Encounter (Signed)
 Appt scheduled

## 2024-02-15 NOTE — Telephone Encounter (Signed)
 Pt wants to know if it would be ok to do her ov for htn as a video call. She states she has a blood pressure cuff at her house. She wanted me to ask for dr.lowne to advise on if a video visit would be ok. Please advise

## 2024-02-16 ENCOUNTER — Other Ambulatory Visit (HOSPITAL_BASED_OUTPATIENT_CLINIC_OR_DEPARTMENT_OTHER): Payer: Self-pay

## 2024-02-16 ENCOUNTER — Ambulatory Visit: Admitting: Student

## 2024-02-16 ENCOUNTER — Encounter: Payer: Self-pay | Admitting: Student

## 2024-02-16 VITALS — BP 136/88 | HR 97 | Resp 16 | Ht 67.0 in | Wt 226.2 lb

## 2024-02-16 DIAGNOSIS — F418 Other specified anxiety disorders: Secondary | ICD-10-CM | POA: Diagnosis not present

## 2024-02-16 DIAGNOSIS — G4700 Insomnia, unspecified: Secondary | ICD-10-CM | POA: Diagnosis not present

## 2024-02-16 DIAGNOSIS — Z638 Other specified problems related to primary support group: Secondary | ICD-10-CM

## 2024-02-16 DIAGNOSIS — I10 Essential (primary) hypertension: Secondary | ICD-10-CM | POA: Diagnosis not present

## 2024-02-16 DIAGNOSIS — F411 Generalized anxiety disorder: Secondary | ICD-10-CM | POA: Diagnosis not present

## 2024-02-16 MED ORDER — LORAZEPAM 1 MG PO TABS
0.5000 mg | ORAL_TABLET | Freq: Every evening | ORAL | 0 refills | Status: AC | PRN
  Filled 2024-02-16: qty 30, 30d supply, fill #0

## 2024-02-16 NOTE — Progress Notes (Addendum)
 "  Acute Office Visit  Subjective:     Patient ID: [name], female    DOB: December 16, 1970, 54 y.o.   MRN: [id number]  Chief Complaint  Patient presents with   Hypertension    Patient is here to follow up with her blood pressure. 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 Feels more stressed lately. Bottom # of BP has been high lately    HPI Patient is in today for   Patient reports fatigue and elevated blood pressure.  Reports BP yesterday 160s/100s She has a history of HTN and is prescribed amlodipine  and nebivolol ; she reports missing her amlodipine  dose yesterday, then took it after checking her BP but did not recheck due to needing to take her father to the ED. She reports being up at night caring for her father, contributing to fatigue. She awoke with a headache, now mild after taking BP medications. She denies chest pain, shortness of breath, vision changes, dizziness, weakness, or numbness.   History of Present Illness [name] is a 54 year old female with hypertension who presents with concerns about elevated blood pressure and headaches.  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 She is frequently awake at night caring for him and had to miss work yesterday due to feeling unwell from high blood pressure.  Her routines are disrupted, and she is using caffeine to stay awake. She is prescribed Ativan  1 mg for sleep but avoids it because she worries she will not wake if her father needs help. She takes Lexapro  in the mornings. She doubled her blood pressure medication yesterday when she felt unwell, which is not her usual practice.  She has a headache despite taking her medications. She has migraines treated with sumatriptan  but has not taken anything for this headache  yet. She is considering Tylenol  or ibuprofen  but is worried about ibuprofen  affecting her kidneys.  She is under high caregiving stress for her father and is considering long-term hospice options with support from her mother, who is a therapist, music. She is in therapy and counseling to help manage stress.     02/16/2024    9:25 AM  GAD 7 : Generalized Anxiety Score  Nervous, Anxious, on Edge 1  Control/stop worrying 1  Worry too much - different things 1  Trouble relaxing 3  Restless 3  Easily annoyed or irritable 3  Afraid - awful might happen 1  Total GAD 7 Score 13  Anxiety Difficulty Very difficult        02/16/2024    9:24 AM 04/06/2023    9:39 AM 11/13/2021    8:00 AM 06/11/2020    4:12 PM 08/03/2019    7:38 AM  Depression screen PHQ 2/9  Decreased Interest 0 0 0 0 3  Down, Depressed, Hopeless 0 0 0 0 3  PHQ - 2 Score 0 0 0 0 6  Altered sleeping 3    2  Tired, decreased energy 3    3  Change in appetite 3    1  Feeling bad or failure about yourself  0    1  Trouble concentrating 1    2  Moving slowly or fidgety/restless 1    3  Suicidal thoughts 0    0  PHQ-9 Score 11    18   Difficult doing work/chores Very difficult    Very difficult     Data saved with a previous flowsheet row definition      PMHx- HTN, DM2  HTN- Meds: amlodipine  10 mg, nebivolol  10 mg daily, lasix  20 mg daily,  Patient denies fever, chills, SOB, CP, palpitations, dyspnea, edema, dizziness, vision changes, N/V/D, abdominal pain, urinary symptoms, rash, weight changes, and recent illness or hospitalizations.   ROS  See HPI    Objective:    BP 136/88 (BP Location: Left Arm, Patient Position: Sitting, Cuff Size: Large)   Pulse 97   Resp 16   Ht 5' 7 (1.702 m)   Wt 226 lb 3.2 oz (102.6 kg)   LMP 08/13/2014   SpO2 98%   BMI 35.43 kg/m    Physical Exam Vitals reviewed.  Constitutional:      General: She is not in acute distress.    Appearance: She is obese. She is not  toxic-appearing.  HENT:     Head: Normocephalic and atraumatic.     Mouth/Throat:     Mouth: Mucous membranes are moist.     Pharynx: Oropharynx is clear.  Eyes:     Pupils: Pupils are equal, round, and reactive to light.  Cardiovascular:     Rate and Rhythm: Normal rate and regular rhythm.     Pulses: Normal pulses.     Heart sounds: Normal heart sounds. No murmur heard. Pulmonary:     Effort: Pulmonary effort is normal. No respiratory distress.     Breath sounds: Normal breath sounds. No wheezing.  Musculoskeletal:        General: No swelling.     Cervical back: Neck supple.  Skin:    General: Skin is warm and dry.  Neurological:     General: No focal deficit present.     Mental Status: She is alert and oriented to person, place, and time.  Psychiatric:        Mood and Affect: Mood normal. Affect is flat.        Behavior: Behavior normal.        Thought Content: Thought content normal.        Judgment: Judgment normal.        No results found for any visits on 02/16/24.      Assessment & Plan:   Problem List Items Addressed This Visit       Cardiovascular and Mediastinum   Essential hypertension - Primary     Other   Insomnia   Other Visit Diagnoses       Caregiver role strain          Essential hypertension Blood pressure normal during OV today. Stress and lack of sleep likely c/t elevations at home - Continue current blood pressure medications as prescribed. - Take medications as prescribed -RTC BP >140/90  Caregiver role strain Significant stress from caregiving responsibilities for father with Lewy body dementia. Lack of sleep and increased caffeine intake are contributing factors. - Encouraged self-care and stress management strategies.  -Rx Ativan  HS prn -FU with PCP in 4 weeks  Insomnia related to situational stress Insomnia likely due to stress and caregiving responsibilities. Not taking prescribed lorazepam  for sleep. - Start Ativan  1 mg  prn bedtime for sleep. - Avoid caffeine intake after 2 PM. - Create a sleep-friendly environment by reducing blue light exposure and ensuring a comfortable sleeping environment. - Aim for 7-8 hours of sleep per  night. - Continue therapy or counseling for stress management. -Work note provided  Migraine Headache despite medication. Stress and lack of sleep may contribute. History of migraines, previously used sumatriptan . May use OTC NSAIDS and Tylenol  as needed    No orders of the defined types were placed in this encounter.   No follow-ups on file.  Harlene LITTIE Jolly, NP   "

## 2024-02-16 NOTE — Addendum Note (Signed)
 Addended by: WHEELER HARLENE CROME on: 02/16/2024 11:01 AM   Modules accepted: Orders

## 2024-02-18 ENCOUNTER — Ambulatory Visit: Admitting: Family Medicine

## 2024-03-01 ENCOUNTER — Other Ambulatory Visit (HOSPITAL_BASED_OUTPATIENT_CLINIC_OR_DEPARTMENT_OTHER): Payer: Self-pay
